# Patient Record
Sex: Female | Born: 1950
Health system: Southern US, Community
[De-identification: ages and names within clinical notes are randomized; demographics above are authoritative.]

## PROBLEM LIST (undated history)

## (undated) DIAGNOSIS — I10 Essential (primary) hypertension: Secondary | ICD-10-CM

## (undated) DIAGNOSIS — F419 Anxiety disorder, unspecified: Secondary | ICD-10-CM

## (undated) DIAGNOSIS — F410 Panic disorder [episodic paroxysmal anxiety] without agoraphobia: Secondary | ICD-10-CM

## (undated) DIAGNOSIS — K219 Gastro-esophageal reflux disease without esophagitis: Secondary | ICD-10-CM

## (undated) DIAGNOSIS — M1990 Unspecified osteoarthritis, unspecified site: Secondary | ICD-10-CM

## (undated) DIAGNOSIS — R42 Dizziness and giddiness: Secondary | ICD-10-CM

## (undated) DIAGNOSIS — N390 Urinary tract infection, site not specified: Secondary | ICD-10-CM

## (undated) HISTORY — PX: BREAST SURGERY: SHX581

## (undated) HISTORY — PX: ABDOMINAL HYSTERECTOMY: SHX81

## (undated) HISTORY — PX: BREAST BIOPSY: SHX20

---

## 1999-03-08 HISTORY — PX: BREAST EXCISIONAL BIOPSY: SUR124

## 2010-12-10 ENCOUNTER — Emergency Department (HOSPITAL_COMMUNITY)
Admission: EM | Admit: 2010-12-10 | Discharge: 2010-12-10 | Disposition: A | Discharge status: Home / Self Care | Payer: Self-pay | Attending: Emergency Medicine | Admitting: Emergency Medicine

## 2010-12-10 ENCOUNTER — Encounter: Payer: Self-pay | Admitting: *Deleted

## 2010-12-10 DIAGNOSIS — R070 Pain in throat: Secondary | ICD-10-CM | POA: Insufficient documentation

## 2010-12-10 DIAGNOSIS — H6690 Otitis media, unspecified, unspecified ear: Secondary | ICD-10-CM | POA: Insufficient documentation

## 2010-12-10 DIAGNOSIS — Z87891 Personal history of nicotine dependence: Secondary | ICD-10-CM | POA: Insufficient documentation

## 2010-12-10 DIAGNOSIS — Z79899 Other long term (current) drug therapy: Secondary | ICD-10-CM | POA: Insufficient documentation

## 2010-12-10 DIAGNOSIS — H9209 Otalgia, unspecified ear: Secondary | ICD-10-CM | POA: Insufficient documentation

## 2010-12-10 DIAGNOSIS — R599 Enlarged lymph nodes, unspecified: Secondary | ICD-10-CM | POA: Insufficient documentation

## 2010-12-10 HISTORY — DX: Unspecified osteoarthritis, unspecified site: M19.90

## 2010-12-10 HISTORY — DX: Essential (primary) hypertension: I10

## 2010-12-10 LAB — RAPID STREP SCREEN (MED CTR MEBANE ONLY): Streptococcus, Group A Screen (Direct): NEGATIVE

## 2010-12-10 MED ORDER — AZITHROMYCIN 250 MG PO TABS: ORAL_TABLET | ORAL | Status: DC

## 2010-12-10 MED ORDER — AZITHROMYCIN 250 MG PO TABS
500.0000 mg | ORAL_TABLET | Freq: Once | ORAL | Status: AC
  Administered 2010-12-10: 500 mg via ORAL
  Filled 2010-12-10: qty 2

## 2010-12-10 MED ORDER — HYDROCODONE-ACETAMINOPHEN 7.5-500 MG/15ML PO SOLN: 10.0000 mL | Freq: Once | ORAL | Status: DC

## 2010-12-10 NOTE — ED Provider Notes (Signed)
History     CSN: [id number] Arrival date & time: 12/10/2010  8:19 PM  Chief Complaint  Patient presents with  . Sore Throat  . Otalgia    (Consider location/radiation/quality/duration/timing/severity/associated sxs/prior treatment) Patient is a 60 y.o. female presenting with pharyngitis. The history is provided by the patient.  Sore Throat This is a new problem. The current episode started today. The problem occurs constantly. The problem has been unchanged. Associated symptoms include a sore throat and swollen glands. Pertinent negatives include no abdominal pain, arthralgias, chest pain, chills, congestion, coughing, fatigue, fever, headaches, myalgias, nausea, neck pain, numbness, rash, urinary symptoms, visual change, vomiting or weakness. Associated symptoms comments: Right ear pain. The symptoms are aggravated by swallowing. She has tried nothing for the symptoms. The treatment provided no relief.    Past Medical History  Diagnosis Date  . Hypertension   . Migraine   . Arthritis     Past Surgical History  Procedure Date  . Abdominal hysterectomy     Family History  Problem Relation Age of Onset  . Hypertension Mother   . Diabetes Father     History  Substance Use Topics  . Smoking status: Former Games developer  . Smokeless tobacco: Not on file  . Alcohol Use: No    OB History    Grav Para Term Preterm Abortions TAB SAB Ect Mult Living                  Review of Systems  Constitutional: Negative for fever, chills and fatigue.  HENT: Positive for ear pain and sore throat. Negative for congestion, facial swelling, mouth sores, trouble swallowing, neck pain, neck stiffness and dental problem.   Eyes: Negative for photophobia, redness and visual disturbance.  Respiratory: Negative for cough, shortness of breath and wheezing.   Cardiovascular: Negative for chest pain and palpitations.  Gastrointestinal: Negative for nausea, vomiting, abdominal pain and blood in stool.    Genitourinary: Negative for dysuria, hematuria and flank pain.  Musculoskeletal: Negative for myalgias, back pain and arthralgias.  Skin: Negative for rash.  Neurological: Negative for dizziness, facial asymmetry, weakness, numbness and headaches.  Hematological: Positive for adenopathy. Does not bruise/bleed easily.  All other systems reviewed and are negative.    Allergies  Amoxicillin; Bactrim; Ciprofloxacin; Imitrex; Tetracyclines & related; Zoloft; and Morphine and related  Home Medications   Current Outpatient Rx  Name Route Sig Dispense Refill  . LISINOPRIL 5 MG PO TABS Oral Take 2.5 mg by mouth every morning.      Marland Kitchen PROPRANOLOL HCL 10 MG PO TABS Oral Take 10 mg by mouth at bedtime.      Marland Kitchen RABEPRAZOLE SODIUM 20 MG PO TBEC Oral Take 20 mg by mouth daily.      . CYCLOBENZAPRINE HCL 10 MG PO TABS Oral Take 10 mg by mouth 3 (three) times daily as needed. For muscle spasms     . MECLIZINE HCL 25 MG PO TABS Oral Take 25 mg by mouth 3 (three) times daily as needed. For dizziness       BP 133/71  Pulse 84  Temp 97.5 F (36.4 C)  Resp 20  Ht 5\' 8"  (1.727 m)  Wt 230 lb (104.327 kg)  BMI 34.97 kg/m2  SpO2 97%  Physical Exam  Nursing note and vitals reviewed. Constitutional: She is oriented to person, place, and time. She appears well-developed and well-nourished.  Non-toxic appearance. She does not have a sickly appearance. She does not appear ill. No distress.  HENT:  Head: Normocephalic and atraumatic. No trismus in the jaw.  Right Ear: No swelling or tenderness. No mastoid tenderness. Tympanic membrane is erythematous and bulging. No hemotympanum. No decreased hearing is noted.  Left Ear: Tympanic membrane normal.  Mouth/Throat: Uvula is midline and mucous membranes are normal. No dental abscesses or uvula swelling. Posterior oropharyngeal edema and posterior oropharyngeal erythema present. No oropharyngeal exudate or tonsillar abscesses.  Eyes: EOM are normal. Pupils are  equal, round, and reactive to light.  Neck: Normal range of motion. Neck supple. No JVD present.  Cardiovascular: Normal rate, regular rhythm and normal heart sounds.   Pulmonary/Chest: Effort normal and breath sounds normal. No respiratory distress. She exhibits no tenderness.  Musculoskeletal: Normal range of motion. She exhibits no tenderness.  Lymphadenopathy:    She has cervical adenopathy.       Right cervical: Superficial cervical adenopathy present. No deep cervical and no posterior cervical adenopathy present.      Left cervical: No superficial cervical, no deep cervical and no posterior cervical adenopathy present.  Neurological: She is alert and oriented to person, place, and time. She displays normal reflexes. No cranial nerve deficit. She exhibits normal muscle tone. Coordination normal.  Skin: Skin is warm and dry.    ED Course  Procedures (including critical care time)  Results for orders placed during the hospital encounter of 12/10/10  RAPID STREP SCREEN      Component Value Range   Streptococcus, Group A Screen (Direct) NEGATIVE  NEGATIVE          MDM    Vitals stable.  NAD.  Non-toxic appearing.  Right otitis media        Parris Cudworth L. Nikolos Billig, [place] 12/11/10 1759

## 2010-12-10 NOTE — ED Notes (Signed)
Pt a/ox4. Resp even and unlabored. NAD at this time. D/C instructions and Rx reviewed with pt. Pt verbalized understanding. Pt ambulated to POV with steady gate.  

## 2010-12-10 NOTE — ED Notes (Signed)
Pt c/o right sided ear pain and sore throat since this morning.

## 2010-12-13 NOTE — ED Provider Notes (Signed)
Medical screening examination/treatment/procedure(s) were performed by non-physician practitioner and as supervising physician I was immediately available for consultation/collaboration.  Shelda Jakes, MD 12/13/10 [phone]

## 2011-04-15 ENCOUNTER — Emergency Department (HOSPITAL_COMMUNITY)
Admission: EM | Admit: 2011-04-15 | Discharge: 2011-04-15 | Disposition: A | Discharge status: Home / Self Care | Payer: Self-pay | Attending: Emergency Medicine | Admitting: Emergency Medicine

## 2011-04-15 ENCOUNTER — Encounter (HOSPITAL_COMMUNITY): Payer: Self-pay

## 2011-04-15 DIAGNOSIS — H6593 Unspecified nonsuppurative otitis media, bilateral: Secondary | ICD-10-CM

## 2011-04-15 DIAGNOSIS — R350 Frequency of micturition: Secondary | ICD-10-CM | POA: Insufficient documentation

## 2011-04-15 DIAGNOSIS — R110 Nausea: Secondary | ICD-10-CM | POA: Insufficient documentation

## 2011-04-15 DIAGNOSIS — H6590 Unspecified nonsuppurative otitis media, unspecified ear: Secondary | ICD-10-CM | POA: Insufficient documentation

## 2011-04-15 DIAGNOSIS — K219 Gastro-esophageal reflux disease without esophagitis: Secondary | ICD-10-CM | POA: Insufficient documentation

## 2011-04-15 DIAGNOSIS — M129 Arthropathy, unspecified: Secondary | ICD-10-CM | POA: Insufficient documentation

## 2011-04-15 DIAGNOSIS — I10 Essential (primary) hypertension: Secondary | ICD-10-CM | POA: Insufficient documentation

## 2011-04-15 DIAGNOSIS — Z79899 Other long term (current) drug therapy: Secondary | ICD-10-CM | POA: Insufficient documentation

## 2011-04-15 DIAGNOSIS — R6883 Chills (without fever): Secondary | ICD-10-CM | POA: Insufficient documentation

## 2011-04-15 DIAGNOSIS — R6889 Other general symptoms and signs: Secondary | ICD-10-CM | POA: Insufficient documentation

## 2011-04-15 DIAGNOSIS — R42 Dizziness and giddiness: Secondary | ICD-10-CM | POA: Insufficient documentation

## 2011-04-15 HISTORY — DX: Gastro-esophageal reflux disease without esophagitis: K21.9

## 2011-04-15 LAB — URINALYSIS, ROUTINE W REFLEX MICROSCOPIC
Bilirubin Urine: NEGATIVE
Nitrite: NEGATIVE
Protein, ur: NEGATIVE mg/dL
Specific Gravity, Urine: 1.025 (ref 1.005–1.030)
Urobilinogen, UA: 0.2 mg/dL (ref 0.0–1.0)

## 2011-04-15 MED ORDER — CETIRIZINE-PSEUDOEPHEDRINE ER 5-120 MG PO TB12: 1.0000 | ORAL_TABLET | Freq: Two times a day (BID) | ORAL | Status: DC | PRN

## 2011-04-15 NOTE — ED Notes (Signed)
Pt presents with nausea, chills, dizziness, and bilateral ear fullness x 2 days.

## 2011-04-15 NOTE — ED Provider Notes (Signed)
History     CSN: [id number]  Arrival date & time 04/15/11  1814   First MD Initiated Contact with Patient 04/15/11 1832      Chief Complaint  Patient presents with  . Nausea  . Chills  . Dizziness    (Consider location/radiation/quality/duration/timing/severity/associated sxs/prior treatment) The history is provided by the patient.  pt c/o bil ear fullness/fluid sensation for past few days. Constant. No severe ear pain, no hearing loss. Hx same. States has been sneezing. No purulent nasal discharge, or sinus pain. No sore throat. No cough. No headache. No fever or chills. Also states urine frequency in past few days. Hx uncomplicated uti. No abd pain. No back or flank pain. No dysuria. No polydipsia. No hx dm.   Past Medical History  Diagnosis Date  . Hypertension   . Migraine   . Arthritis   . GERD (gastroesophageal reflux disease)     Past Surgical History  Procedure Date  . Abdominal hysterectomy   . Breast surgery     Family History  Problem Relation Age of Onset  . Hypertension Mother   . Diabetes Father     History  Substance Use Topics  . Smoking status: Former Games developer  . Smokeless tobacco: Not on file  . Alcohol Use: No    OB History    Grav Para Term Preterm Abortions TAB SAB Ect Mult Living                  Review of Systems  Constitutional: Negative for fever and chills.  HENT: Negative for neck pain and neck stiffness.   Eyes: Negative for discharge and redness.  Respiratory: Negative for cough and shortness of breath.   Cardiovascular: Negative for chest pain.  Gastrointestinal: Negative for abdominal pain.  Genitourinary: Negative for flank pain.  Musculoskeletal: Negative for back pain.  Skin: Negative for rash.  Neurological: Negative for headaches.  Hematological: Does not bruise/bleed easily.  Psychiatric/Behavioral: Negative for confusion.    Allergies  Amoxicillin; Bactrim; Ciprofloxacin; Imitrex; Tetracyclines & related; Zoloft;  and Morphine and related  Home Medications   Current Outpatient Rx  Name Route Sig Dispense Refill  . AZITHROMYCIN 250 MG PO TABS  Take first 2 tablets together, then 1 every day until finished. 6 tablet 0  . CYCLOBENZAPRINE HCL 10 MG PO TABS Oral Take 10 mg by mouth 3 (three) times daily as needed. For muscle spasms     . LISINOPRIL 5 MG PO TABS Oral Take 2.5 mg by mouth every morning.      Marland Kitchen MECLIZINE HCL 25 MG PO TABS Oral Take 25 mg by mouth 3 (three) times daily as needed. For dizziness     . PROPRANOLOL HCL 10 MG PO TABS Oral Take 10 mg by mouth at bedtime.      Marland Kitchen RABEPRAZOLE SODIUM 20 MG PO TBEC Oral Take 20 mg by mouth daily.        BP 123/80  Pulse 91  Temp(Src) 98.6 F (37 C) (Oral)  Resp 18  Wt 222 lb 6 oz (100.869 kg)  SpO2 98%  Physical Exam  Nursing note and vitals reviewed. Constitutional: She is oriented to person, place, and time. She appears well-developed and well-nourished. No distress.  HENT:  Nose: Nose normal.  Mouth/Throat: Oropharynx is clear and moist.       Clear fluid behind bil tm. eacs clear.   Eyes: Conjunctivae are normal. No scleral icterus.  Neck: Normal range of motion. Neck supple. No tracheal  deviation present.       No stiffness or rigidity  Cardiovascular: Normal rate, normal heart sounds and intact distal pulses.   Pulmonary/Chest: Effort normal and breath sounds normal. No respiratory distress.  Abdominal: Soft. Normal appearance. There is no tenderness.  Genitourinary:       No cva tenderness  Musculoskeletal: She exhibits no edema.  Lymphadenopathy:    She has no cervical adenopathy.  Neurological: She is alert and oriented to person, place, and time.       Steady gait.   Skin: Skin is warm and dry. No rash noted.  Psychiatric: She has a normal mood and affect.    ED Course  Procedures (including critical care time)  Labs Reviewed - No data to display  Results for orders placed during the hospital encounter of 04/15/11    URINALYSIS, ROUTINE W REFLEX MICROSCOPIC      Component Value Range   Color, Urine YELLOW  YELLOW    APPearance CLEAR  CLEAR    Specific Gravity, Urine 1.025  1.005 - 1.030    pH 5.5  5.0 - 8.0    Glucose, UA NEGATIVE  NEGATIVE (mg/dL)   Hgb urine dipstick NEGATIVE  NEGATIVE    Bilirubin Urine NEGATIVE  NEGATIVE    Ketones, ur NEGATIVE  NEGATIVE (mg/dL)   Protein, ur NEGATIVE  NEGATIVE (mg/dL)   Urobilinogen, UA 0.2  0.0 - 1.0 (mg/dL)   Nitrite NEGATIVE  NEGATIVE    Leukocytes, UA NEGATIVE  NEGATIVE        MDM  Fluid behind bil tm, clear, no ertyema, no purulence.   No mastoid tenderness.   Ua.         Suzi Roots, MD 04/15/11 [phone]

## 2011-05-15 ENCOUNTER — Encounter (HOSPITAL_COMMUNITY): Payer: Self-pay | Admitting: *Deleted

## 2011-05-15 ENCOUNTER — Emergency Department (HOSPITAL_COMMUNITY)
Admission: EM | Admit: 2011-05-15 | Discharge: 2011-05-15 | Disposition: A | Discharge status: Home Health | Payer: Self-pay | Attending: Emergency Medicine | Admitting: Emergency Medicine

## 2011-05-15 DIAGNOSIS — M129 Arthropathy, unspecified: Secondary | ICD-10-CM | POA: Insufficient documentation

## 2011-05-15 DIAGNOSIS — Z832 Family history of diseases of the blood and blood-forming organs and certain disorders involving the immune mechanism: Secondary | ICD-10-CM | POA: Insufficient documentation

## 2011-05-15 DIAGNOSIS — J029 Acute pharyngitis, unspecified: Secondary | ICD-10-CM | POA: Insufficient documentation

## 2011-05-15 DIAGNOSIS — I10 Essential (primary) hypertension: Secondary | ICD-10-CM | POA: Insufficient documentation

## 2011-05-15 DIAGNOSIS — K219 Gastro-esophageal reflux disease without esophagitis: Secondary | ICD-10-CM | POA: Insufficient documentation

## 2011-05-15 MED ORDER — AZITHROMYCIN 250 MG PO TABS: ORAL_TABLET | ORAL | Status: DC

## 2011-05-15 NOTE — ED Notes (Signed)
Pt presents to ED today with URI symptoms x 1 week.

## 2011-05-15 NOTE — ED Notes (Signed)
Pt DC to home with steady gait 

## 2011-05-15 NOTE — Discharge Instructions (Signed)
Please use salt water gargles 3 times daily. Rest her voice as much as possible. Use 2 tablets of Zithromax on day one, then 1 daily with food until all taken. Please use Tylenol every 4 hours for fever or aching. Wash hands frequently.Pharyngitis, Viral and Bacterial Pharyngitis is soreness (inflammation) or infection of the pharynx. It is also called a sore throat. CAUSES  Most sore throats are caused by viruses and are part of a cold. However, some sore throats are caused by strep and other bacteria. Sore throats can also be caused by post nasal drip from draining sinuses, allergies and sometimes from sleeping with an open mouth. Infectious sore throats can be spread from person to person by coughing, sneezing and sharing cups or eating utensils. TREATMENT  Sore throats that are viral usually last 3-4 days. Viral illness will get better without medications (antibiotics). Strep throat and other bacterial infections will usually begin to get better about 24-48 hours after you begin to take antibiotics. HOME CARE INSTRUCTIONS   If the caregiver feels there is a bacterial infection or if there is a positive strep test, they will prescribe an antibiotic. The full course of antibiotics must be taken. If the full course of antibiotic is not taken, you or your child may become ill again. If you or your child has strep throat and do not finish all of the medication, serious heart or kidney diseases may develop.   Drink enough water and fluids to keep your urine clear or pale yellow.   Only take over-the-counter or prescription medicines for pain, discomfort or fever as directed by your caregiver.   Get lots of rest.   Gargle with salt water ( tsp. of salt in a glass of water) as often as every 1-2 hours as you need for comfort.   Hard candies may soothe the throat if individual is not at risk for choking. Throat sprays or lozenges may also be used.  SEEK MEDICAL CARE IF:   Large, tender lumps in the  neck develop.   A rash develops.   Green, yellow-brown or bloody sputum is coughed up.   Your baby is older than 3 months with a rectal temperature of 100.5 F (38.1 C) or higher for more than 1 day.  SEEK IMMEDIATE MEDICAL CARE IF:   A stiff neck develops.   You or your child are drooling or unable to swallow liquids.   You or your child are vomiting, unable to keep medications or liquids down.   You or your child has severe pain, unrelieved with recommended medications.   You or your child are having difficulty breathing (not due to stuffy nose).   You or your child are unable to fully open your mouth.   You or your child develop redness, swelling, or severe pain anywhere on the neck.   You have a fever.   Your baby is older than 3 months with a rectal temperature of 102 F (38.9 C) or higher.   Your baby is 35 months old or younger with a rectal temperature of 100.4 F (38 C) or higher.  MAKE SURE YOU:   Understand these instructions.   Will watch your condition.   Will get help right away if you are not doing well or get worse.  Document Released: 02/21/2005 Document Revised: 02/10/2011 Document Reviewed: 05/21/2007 [facility] Patient Information 2012 [place], [place].

## 2011-05-15 NOTE — ED Provider Notes (Signed)
History     CSN: [id number]  Arrival date & time 05/15/11  1120   First MD Initiated Contact with Patient 05/15/11 1254      Chief Complaint  Patient presents with  . URI  . Sore Throat    (Consider location/radiation/quality/duration/timing/severity/associated sxs/prior treatment) Patient is a 61 y.o. female presenting with URI and pharyngitis. The history is provided by the patient.  URI The primary symptoms include fatigue, headaches, ear pain and myalgias. Primary symptoms do not include cough, wheezing, abdominal pain or arthralgias. Primary symptoms comment: sorethroat The current episode started 6 to 7 days ago. This is a new problem.  The headache is not associated with photophobia.  Symptoms associated with the illness include chills, sinus pressure and congestion.  Sore Throat Associated symptoms include chills, congestion, fatigue, headaches and myalgias. Pertinent negatives include no abdominal pain, arthralgias, chest pain, coughing or neck pain.    Past Medical History  Diagnosis Date  . Hypertension   . Migraine   . Arthritis   . GERD (gastroesophageal reflux disease)     Past Surgical History  Procedure Date  . Abdominal hysterectomy   . Breast surgery     Family History  Problem Relation Age of Onset  . Hypertension Mother   . Diabetes Father     History  Substance Use Topics  . Smoking status: Former Games developer  . Smokeless tobacco: Not on file  . Alcohol Use: No    OB History    Grav Para Term Preterm Abortions TAB SAB Ect Mult Living                  Review of Systems  Constitutional: Positive for chills and fatigue. Negative for activity change.       All ROS Neg except as noted in HPI  HENT: Positive for ear pain, congestion and sinus pressure. Negative for nosebleeds and neck pain.   Eyes: Negative for photophobia and discharge.  Respiratory: Negative for cough, shortness of breath and wheezing.   Cardiovascular: Negative for chest  pain and palpitations.  Gastrointestinal: Negative for abdominal pain and blood in stool.  Genitourinary: Negative for dysuria, frequency and hematuria.  Musculoskeletal: Positive for myalgias. Negative for back pain and arthralgias.  Skin: Negative.   Neurological: Positive for headaches. Negative for dizziness, seizures and speech difficulty.  Psychiatric/Behavioral: Negative for hallucinations and confusion.    Allergies  Amoxicillin; Bactrim; Ciprofloxacin; Imitrex; Morphine and related; Tetracyclines & related; and Zoloft  Home Medications   Current Outpatient Rx  Name Route Sig Dispense Refill  . CETIRIZINE-PSEUDOEPHEDRINE ER 5-120 MG PO TB12 Oral Take 1 tablet by mouth 2 (two) times daily as needed for allergies. 20 tablet 0  . CYCLOBENZAPRINE HCL 10 MG PO TABS Oral Take 10 mg by mouth 3 (three) times daily as needed. For muscle spasms     . LISINOPRIL 5 MG PO TABS Oral Take 2.5 mg by mouth every morning.      Marland Kitchen MECLIZINE HCL 25 MG PO TABS Oral Take 25 mg by mouth 3 (three) times daily as needed. For dizziness     . PROPRANOLOL HCL 10 MG PO TABS Oral Take 10 mg by mouth at bedtime.      Marland Kitchen RABEPRAZOLE SODIUM 20 MG PO TBEC Oral Take 20 mg by mouth daily.        BP 121/75  Pulse 93  Temp(Src) 98.2 F (36.8 C) (Oral)  Resp 22  SpO2 98%  Physical Exam  Nursing note and  vitals reviewed. Constitutional: She is oriented to person, place, and time. She appears well-developed and well-nourished.  Non-toxic appearance.  HENT:  Head: Normocephalic.  Right Ear: Tympanic membrane and external ear normal.  Left Ear: Tympanic membrane and external ear normal.       Mild to moderate nasal congestion noted. The posterior pharynx shows increased redness and mild swelling. The uvula is swollen but remains in the midline. The airway is patent.  Eyes: EOM and lids are normal. Pupils are equal, round, and reactive to light.  Neck: Normal range of motion. Neck supple. Carotid bruit is not  present.  Cardiovascular: Normal rate, regular rhythm, normal heart sounds, intact distal pulses and normal pulses.   Pulmonary/Chest: Breath sounds normal. No respiratory distress.  Abdominal: Soft. Bowel sounds are normal. There is no tenderness. There is no guarding.  Musculoskeletal: Normal range of motion.  Lymphadenopathy:       Head (right side): No submandibular adenopathy present.       Head (left side): No submandibular adenopathy present.    She has no cervical adenopathy.  Neurological: She is alert and oriented to person, place, and time. She has normal strength. No cranial nerve deficit or sensory deficit.  Skin: Skin is warm and dry.  Psychiatric: She has a normal mood and affect. Her speech is normal.    ED Course  Procedures (including critical care time)  Labs Reviewed - No data to display No results found.   1. Pharyngitis       MDM  I have reviewed nursing notes, vital signs, and all appropriate lab and imaging results for this patient. Vital signs are found to be within normal limits. The patient appears to be in no acute distress at this time. Patient has pain with swallowing but can swallow liquids. Prescription for Zithromax [id number] given. Patient advised to see her primary physician for recheck next week.       Kathie Dike, [place] 05/15/11 1311

## 2011-05-17 NOTE — ED Provider Notes (Signed)
Medical screening examination/treatment/procedure(s) were performed by non-physician practitioner and as supervising physician I was immediately available for consultation/collaboration. Devoria Albe, MD, [facility]   Ward Givens, MD 05/17/11 [phone]

## 2011-05-31 ENCOUNTER — Emergency Department (HOSPITAL_COMMUNITY)
Admission: EM | Admit: 2011-05-31 | Discharge: 2011-05-31 | Disposition: A | Discharge status: Home / Self Care | Payer: Self-pay | Attending: Emergency Medicine | Admitting: Emergency Medicine

## 2011-05-31 ENCOUNTER — Encounter (HOSPITAL_COMMUNITY): Payer: Self-pay | Admitting: *Deleted

## 2011-05-31 DIAGNOSIS — R197 Diarrhea, unspecified: Secondary | ICD-10-CM | POA: Insufficient documentation

## 2011-05-31 DIAGNOSIS — K219 Gastro-esophageal reflux disease without esophagitis: Secondary | ICD-10-CM | POA: Insufficient documentation

## 2011-05-31 DIAGNOSIS — I10 Essential (primary) hypertension: Secondary | ICD-10-CM | POA: Insufficient documentation

## 2011-05-31 DIAGNOSIS — E860 Dehydration: Secondary | ICD-10-CM | POA: Insufficient documentation

## 2011-05-31 LAB — DIFFERENTIAL
Basophils Absolute: 0 10*3/uL (ref 0.0–0.1)
Basophils Relative: 0 % (ref 0–1)
Eosinophils Absolute: 0.2 10*3/uL (ref 0.0–0.7)
Lymphs Abs: 1.4 10*3/uL (ref 0.7–4.0)
Neutrophils Relative %: 72 % (ref 43–77)

## 2011-05-31 LAB — BASIC METABOLIC PANEL
Calcium: 9.9 mg/dL (ref 8.4–10.5)
GFR calc Af Amer: >90 mL/min (ref >90)
GFR calc non Af Amer: >90 mL/min (ref >90)
Glucose, Bld: 123 mg/dL — ABNORMAL HIGH (ref 70–99)
Potassium: 4.1 mEq/L (ref 3.5–5.1)
Sodium: 140 mEq/L (ref 135–145)

## 2011-05-31 LAB — CBC
MCH: 29.4 pg (ref 26.0–34.0)
Platelets: 198 10*3/uL (ref 150–400)
RBC: 4.66 MIL/uL (ref 3.87–5.11)
RDW: 12.6 % (ref 11.5–15.5)

## 2011-05-31 MED ORDER — SODIUM CHLORIDE 0.9 % IV BOLUS (SEPSIS)
1000.0000 mL | Freq: Once | INTRAVENOUS | Status: AC
  Administered 2011-05-31: 1000 mL via INTRAVENOUS

## 2011-05-31 MED ORDER — KETOROLAC TROMETHAMINE 30 MG/ML IJ SOLN: 30.0000 mg | Freq: Once | INTRAMUSCULAR | Status: DC

## 2011-05-31 MED ORDER — HYDROCORTISONE 1 % EX CREA: TOPICAL_CREAM | CUTANEOUS | Status: DC

## 2011-05-31 MED ORDER — ONDANSETRON HCL 4 MG/2ML IJ SOLN: 4.0000 mg | Freq: Once | INTRAMUSCULAR | Status: DC

## 2011-05-31 NOTE — ED Notes (Signed)
C/o diarrhea since yesterday morning (onset 1000); also c/o nausea and weakness.

## 2011-05-31 NOTE — Discharge Instructions (Signed)
Drink plenty of fluids.  And follow up with your md in 2 days

## 2011-06-02 NOTE — ED Provider Notes (Signed)
History     CSN: [id number]  Arrival date & time 05/31/11  4098   First MD Initiated Contact with Patient 05/31/11 [phone]      Chief Complaint  Patient presents with  . Diarrhea  . Nausea    (Consider location/radiation/quality/duration/timing/severity/associated sxs/prior treatment) Patient is a 61 y.o. female presenting with diarrhea. The history is provided by the patient (pt complains of diarrhea and soreness to her anus). No language interpreter was used.  Diarrhea The primary symptoms include diarrhea. Primary symptoms do not include fever, fatigue, abdominal pain or rash. The illness began yesterday. The onset was sudden. The problem has not changed since onset. The diarrhea began 2 days ago. The diarrhea is watery. The diarrhea occurs 5 to 10 times per day.  The illness does not include chills, constipation or back pain. Associated medical issues do not include inflammatory bowel disease.    Past Medical History  Diagnosis Date  . Hypertension   . Migraine   . Arthritis   . GERD (gastroesophageal reflux disease)     Past Surgical History  Procedure Date  . Abdominal hysterectomy   . Breast surgery     Family History  Problem Relation Age of Onset  . Hypertension Mother   . Diabetes Father     History  Substance Use Topics  . Smoking status: Former Games developer  . Smokeless tobacco: Not on file  . Alcohol Use: No    OB History    Grav Para Term Preterm Abortions TAB SAB Ect Mult Living                  Review of Systems  Constitutional: Negative for fever, chills and fatigue.  HENT: Negative for congestion, sinus pressure and ear discharge.   Eyes: Negative for discharge.  Respiratory: Negative for cough.   Cardiovascular: Negative for chest pain.  Gastrointestinal: Positive for diarrhea. Negative for abdominal pain and constipation.  Genitourinary: Negative for frequency and hematuria.  Musculoskeletal: Negative for back pain.  Skin: Negative for rash.   Neurological: Negative for seizures and headaches.  Hematological: Negative.   Psychiatric/Behavioral: Negative for hallucinations.    Allergies  Amoxicillin; Bactrim; Ciprofloxacin; Imitrex; Morphine and related; Tetracyclines & related; and Zoloft  Home Medications   Current Outpatient Rx  Name Route Sig Dispense Refill  . AZITHROMYCIN 250 MG PO TABS  2 tablets day 1, then 1 tablet daily with food until all taken. 6 tablet 0  . CETIRIZINE-PSEUDOEPHEDRINE ER 5-120 MG PO TB12 Oral Take 1 tablet by mouth 2 (two) times daily as needed for allergies. 20 tablet 0  . CYCLOBENZAPRINE HCL 10 MG PO TABS Oral Take 10 mg by mouth 3 (three) times daily as needed. For muscle spasms     . HYDROCORTISONE 1 % EX CREA  Apply to affected area 4 times daily 30 g 0  . LISINOPRIL 5 MG PO TABS Oral Take 2.5 mg by mouth every morning.      Marland Kitchen MECLIZINE HCL 25 MG PO TABS Oral Take 25 mg by mouth 3 (three) times daily as needed. For dizziness     . PROPRANOLOL HCL 10 MG PO TABS Oral Take 10 mg by mouth at bedtime.      Marland Kitchen RABEPRAZOLE SODIUM 20 MG PO TBEC Oral Take 20 mg by mouth daily.        BP 136/66  Pulse 80  Temp(Src) 98.4 F (36.9 C) (Oral)  Resp 16  Ht 5' 8.5" (1.74 m)  Wt 222 lb (  100.699 kg)  BMI 33.26 kg/m2  SpO2 98%  Physical Exam  Constitutional: She is oriented to person, place, and time. She appears well-developed.  HENT:  Head: Normocephalic and atraumatic.  Eyes: Conjunctivae and EOM are normal. No scleral icterus.  Neck: Neck supple. No thyromegaly present.  Cardiovascular: Normal rate and regular rhythm.  Exam reveals no gallop and no friction rub.   No murmur heard. Pulmonary/Chest: No stridor. She has no wheezes. She has no rales. She exhibits no tenderness.  Abdominal: She exhibits no distension. There is no tenderness. There is no rebound.  Genitourinary:       Mild inflamation around anus  Musculoskeletal: Normal range of motion. She exhibits no edema.  Lymphadenopathy:     She has no cervical adenopathy.  Neurological: She is oriented to person, place, and time. Coordination normal.  Skin: No rash noted. No erythema.  Psychiatric: She has a normal mood and affect. Her behavior is normal.    ED Course  Procedures (including critical care time)  Labs Reviewed  BASIC METABOLIC PANEL - Abnormal; Notable for the following:    Glucose, Bld 123 (*)    All other components within normal limits  CBC  DIFFERENTIAL  LAB REPORT - SCANNED   No results found.   1. Diarrhea    Results for orders placed during the hospital encounter of 05/31/11  CBC      Component Value Range   WBC 7.4  4.0 - 10.5 (K/uL)   RBC 4.66  3.87 - 5.11 (MIL/uL)   Hemoglobin 13.7  12.0 - 15.0 (g/dL)   HCT 16.1  09.6 - 04.5 (%)   MCV 90.3  78.0 - 100.0 (fL)   MCH 29.4  26.0 - 34.0 (pg)   MCHC 32.5  30.0 - 36.0 (g/dL)   RDW 40.9  81.1 - 91.4 (%)   Platelets 198  150 - 400 (K/uL)  DIFFERENTIAL      Component Value Range   Neutrophils Relative 72  43 - 77 (%)   Neutro Abs 5.4  1.7 - 7.7 (K/uL)   Lymphocytes Relative 19  12 - 46 (%)   Lymphs Abs 1.4  0.7 - 4.0 (K/uL)   Monocytes Relative 6  3 - 12 (%)   Monocytes Absolute 0.5  0.1 - 1.0 (K/uL)   Eosinophils Relative 2  0 - 5 (%)   Eosinophils Absolute 0.2  0.0 - 0.7 (K/uL)   Basophils Relative 0  0 - 1 (%)   Basophils Absolute 0.0  0.0 - 0.1 (K/uL)  BASIC METABOLIC PANEL      Component Value Range   Sodium 140  135 - 145 (mEq/L)   Potassium 4.1  3.5 - 5.1 (mEq/L)   Chloride 104  96 - 112 (mEq/L)   CO2 24  19 - 32 (mEq/L)   Glucose, Bld 123 (*) 70 - 99 (mg/dL)   BUN 9  6 - 23 (mg/dL)   Creatinine, Ser 7.82  0.50 - 1.10 (mg/dL)   Calcium 9.9  8.4 - 95.6 (mg/dL)   GFR calc non Af Amer >90  >90 (mL/min)   GFR calc Af Amer >90  >90 (mL/min)   No results found.     MDM  Diarrhea and dehydration.  Pt stable and will follow up as needed        [name], MD 06/02/11 [phone]

## 2011-06-21 ENCOUNTER — Other Ambulatory Visit (HOSPITAL_COMMUNITY): Payer: Self-pay | Admitting: Family Medicine

## 2011-06-21 DIAGNOSIS — Z139 Encounter for screening, unspecified: Secondary | ICD-10-CM

## 2011-07-05 ENCOUNTER — Ambulatory Visit (HOSPITAL_COMMUNITY)
Admission: RE | Admit: 2011-07-05 | Discharge: 2011-07-05 | Disposition: A | Discharge status: Home / Self Care | Payer: PRIVATE HEALTH INSURANCE | Source: Ambulatory Visit | Attending: Family Medicine | Admitting: Family Medicine

## 2011-07-05 DIAGNOSIS — Z139 Encounter for screening, unspecified: Secondary | ICD-10-CM

## 2011-07-05 DIAGNOSIS — Z1231 Encounter for screening mammogram for malignant neoplasm of breast: Secondary | ICD-10-CM | POA: Insufficient documentation

## 2011-08-27 ENCOUNTER — Encounter (HOSPITAL_COMMUNITY): Payer: Self-pay

## 2011-08-27 ENCOUNTER — Emergency Department (HOSPITAL_COMMUNITY)
Admission: EM | Admit: 2011-08-27 | Discharge: 2011-08-27 | Disposition: A | Discharge status: Home / Self Care | Payer: Self-pay | Attending: Emergency Medicine | Admitting: Emergency Medicine

## 2011-08-27 DIAGNOSIS — H9209 Otalgia, unspecified ear: Secondary | ICD-10-CM | POA: Insufficient documentation

## 2011-08-27 DIAGNOSIS — Z885 Allergy status to narcotic agent status: Secondary | ICD-10-CM | POA: Insufficient documentation

## 2011-08-27 DIAGNOSIS — K219 Gastro-esophageal reflux disease without esophagitis: Secondary | ICD-10-CM | POA: Insufficient documentation

## 2011-08-27 DIAGNOSIS — M129 Arthropathy, unspecified: Secondary | ICD-10-CM | POA: Insufficient documentation

## 2011-08-27 DIAGNOSIS — Z87891 Personal history of nicotine dependence: Secondary | ICD-10-CM | POA: Insufficient documentation

## 2011-08-27 DIAGNOSIS — H6093 Unspecified otitis externa, bilateral: Secondary | ICD-10-CM

## 2011-08-27 DIAGNOSIS — G43909 Migraine, unspecified, not intractable, without status migrainosus: Secondary | ICD-10-CM | POA: Insufficient documentation

## 2011-08-27 DIAGNOSIS — Z90710 Acquired absence of both cervix and uterus: Secondary | ICD-10-CM | POA: Insufficient documentation

## 2011-08-27 DIAGNOSIS — H60399 Other infective otitis externa, unspecified ear: Secondary | ICD-10-CM | POA: Insufficient documentation

## 2011-08-27 DIAGNOSIS — Z888 Allergy status to other drugs, medicaments and biological substances status: Secondary | ICD-10-CM | POA: Insufficient documentation

## 2011-08-27 DIAGNOSIS — Z833 Family history of diabetes mellitus: Secondary | ICD-10-CM | POA: Insufficient documentation

## 2011-08-27 DIAGNOSIS — I10 Essential (primary) hypertension: Secondary | ICD-10-CM | POA: Insufficient documentation

## 2011-08-27 DIAGNOSIS — Z8249 Family history of ischemic heart disease and other diseases of the circulatory system: Secondary | ICD-10-CM | POA: Insufficient documentation

## 2011-08-27 DIAGNOSIS — Z881 Allergy status to other antibiotic agents status: Secondary | ICD-10-CM | POA: Insufficient documentation

## 2011-08-27 MED ORDER — AZITHROMYCIN 250 MG PO TABS
500.0000 mg | ORAL_TABLET | Freq: Once | ORAL | Status: AC
  Administered 2011-08-27: 500 mg via ORAL
  Filled 2011-08-27: qty 2

## 2011-08-27 MED ORDER — NEOMYCIN-POLYMYXIN-HC 3.5-10000-1 OT SOLN
OTIC | Status: AC
  Filled 2011-08-27: qty 10

## 2011-08-27 MED ORDER — ANTIPYRINE-BENZOCAINE 5.4-1.4 % OT SOLN
3.0000 [drp] | OTIC | Status: DC | PRN
  Administered 2011-08-27: 3 [drp] via OTIC
  Filled 2011-08-27: qty 10

## 2011-08-27 MED ORDER — AZITHROMYCIN 250 MG PO TABS: ORAL_TABLET | ORAL | Status: DC

## 2011-08-27 MED ORDER — NEOMYCIN-POLYMYXIN-HC 3.5-10000-1 OT SOLN: 3.0000 [drp] | OTIC | Status: DC

## 2011-08-27 MED ORDER — IBUPROFEN 800 MG PO TABS: 800.0000 mg | ORAL_TABLET | Freq: Three times a day (TID) | ORAL | Status: AC

## 2011-08-27 NOTE — Discharge Instructions (Signed)
Please use overall been drops if needed for pain or discomfort. You may use these every 3 hours. Zithromax 1 daily for the next 4 days. Cortisporin drops to both ears every 4 hours for the next 5 days. Ibuprofen 3 times daily with food for pain and inflammation. Please see your primary physician if not improving.Otitis Externa Swimmer's ear (otitis externa) is an infection in the outer ear canal. It can be caused by a germ or a fungus. It may be caused by:  Swimming in dirty water.   Water that stays in the ear after swimming.  HOME CARE  Put drops in the ear canal as told by your doctor.   Only take medicine as told by your doctor.  GET HELP RIGHT AWAY IF:   You have a temperature by mouth above 102 F (38.9 C), not controlled by medicine.   There is ear pain after 3 days.  MAKE SURE YOU:   Understand these instructions.   Will watch this condition.   Will get help right away if you are not doing well or get worse.  Document Released: 08/10/2007 Document Revised: 02/10/2011 Document Reviewed: 08/10/2007 [facility] Patient Information 2012 [place], [place].

## 2011-08-27 NOTE — ED Notes (Signed)
Pt verbalized understanding of d/c instructions and without further questions 

## 2011-08-27 NOTE — ED Provider Notes (Signed)
History     CSN: [id number]  Arrival date & time 08/27/11  [place] Guardian   First MD Initiated Contact with Patient 08/27/11 2013      Chief Complaint  Patient presents with  . Otalgia    (Consider location/radiation/quality/duration/timing/severity/associated sxs/prior treatment) Patient is a 61 y.o. female presenting with ear pain. The history is provided by the patient.  Otalgia This is a new problem. The current episode started 2 days ago. There is pain in both ears. The problem occurs daily. The problem has not changed since onset.There has been no fever. The pain is moderate. Associated symptoms include headaches. Pertinent negatives include no ear discharge, no abdominal pain, no vomiting, no neck pain, no cough and no rash. Her past medical history does not include hearing loss.    Past Medical History  Diagnosis Date  . Hypertension   . Migraine   . Arthritis   . GERD (gastroesophageal reflux disease)     Past Surgical History  Procedure Date  . Abdominal hysterectomy   . Breast surgery     Family History  Problem Relation Age of Onset  . Hypertension Mother   . Diabetes Father     History  Substance Use Topics  . Smoking status: Former Games developer  . Smokeless tobacco: Not on file  . Alcohol Use: No    OB History    Grav Para Term Preterm Abortions TAB SAB Ect Mult Living                  Review of Systems  Constitutional: Negative for activity change.       All ROS Neg except as noted in HPI  HENT: Positive for ear pain. Negative for nosebleeds, neck pain and ear discharge.   Eyes: Negative for photophobia and discharge.  Respiratory: Negative for cough, shortness of breath and wheezing.   Cardiovascular: Negative for chest pain and palpitations.  Gastrointestinal: Negative for vomiting, abdominal pain and blood in stool.  Genitourinary: Negative for dysuria, frequency and hematuria.  Musculoskeletal: Negative for back pain and arthralgias.  Skin: Negative.   Negative for rash.  Neurological: Positive for headaches. Negative for dizziness, seizures and speech difficulty.  Psychiatric/Behavioral: Negative for hallucinations and confusion.    Allergies  Amoxicillin; Bactrim; Ciprofloxacin; Imitrex; Morphine and related; Tetracyclines & related; and Zoloft  Home Medications   Current Outpatient Rx  Name Route Sig Dispense Refill  . AZITHROMYCIN 250 MG PO TABS  2 tablets day 1, then 1 tablet daily with food until all taken. 6 tablet 0  . AZITHROMYCIN 250 MG PO TABS  1 po daily, take with food 4 tablet 0  . CETIRIZINE-PSEUDOEPHEDRINE ER 5-120 MG PO TB12 Oral Take 1 tablet by mouth 2 (two) times daily as needed for allergies. 20 tablet 0  . CYCLOBENZAPRINE HCL 10 MG PO TABS Oral Take 10 mg by mouth 3 (three) times daily as needed. For muscle spasms     . HYDROCORTISONE 1 % EX CREA  Apply to affected area 4 times daily 30 g 0  . IBUPROFEN 800 MG PO TABS Oral Take 1 tablet (800 mg total) by mouth 3 (three) times daily. 21 tablet 0  . LISINOPRIL 5 MG PO TABS Oral Take 2.5 mg by mouth every morning.      Marland Kitchen MECLIZINE HCL 25 MG PO TABS Oral Take 25 mg by mouth 3 (three) times daily as needed. For dizziness     . PROPRANOLOL HCL 10 MG PO TABS Oral Take 10  mg by mouth at bedtime.      Marland Kitchen RABEPRAZOLE SODIUM 20 MG PO TBEC Oral Take 20 mg by mouth daily.        BP 140/73  Pulse 77  Temp 98.1 F (36.7 C) (Oral)  Resp 16  Ht 5\' 8"  (1.727 m)  Wt 223 lb 3.2 oz (101.243 kg)  BMI 33.94 kg/m2  SpO2 100%  Physical Exam  Nursing note and vitals reviewed. Constitutional: She is oriented to person, place, and time. She appears well-developed and well-nourished.  Non-toxic appearance.  HENT:  Head: Normocephalic.  Right Ear: Tympanic membrane and external ear normal.  Left Ear: Tympanic membrane and external ear normal.       There is increased redness of the right and left external auditory canal. The tympanic membranes are well within normal limits. There is  mild soreness with tugging of the ears. There is no palpable nodes anterior to the ear and no swelling behind the ear.  Eyes: EOM and lids are normal. Pupils are equal, round, and reactive to light.  Neck: Normal range of motion. Neck supple. Carotid bruit is not present.  Cardiovascular: Normal rate, regular rhythm, normal heart sounds, intact distal pulses and normal pulses.   Pulmonary/Chest: Breath sounds normal. No respiratory distress.  Abdominal: Soft. Bowel sounds are normal. There is no tenderness. There is no guarding.  Musculoskeletal: Normal range of motion.  Lymphadenopathy:       Head (right side): No submandibular adenopathy present.       Head (left side): No submandibular adenopathy present.    She has no cervical adenopathy.  Neurological: She is alert and oriented to person, place, and time. She has normal strength. No cranial nerve deficit or sensory deficit.  Skin: Skin is warm and dry.  Psychiatric: She has a normal mood and affect. Her speech is normal.    ED Course  Procedures (including critical care time)  Labs Reviewed - No data to display No results found.   1. Otitis externa of both ears       MDM  I have reviewed nursing notes, vital signs, and all appropriate lab and imaging results for this patient. Patient has pain of both ears. Left more than right. Patient treated with Zithromax, ibuprofen, and overall been drops. Patient is to see her primary physician for followup and recheck if not improving.       Kathie Dike, [place] 08/27/11 2048

## 2011-08-27 NOTE — ED Notes (Signed)
Complain of pain in both ears

## 2011-08-28 NOTE — ED Provider Notes (Signed)
Medical screening examination/treatment/procedure(s) were performed by non-physician practitioner and as supervising physician I was immediately available for consultation/collaboration.   Joya Gaskins, MD 08/28/11 [phone]

## 2011-09-18 ENCOUNTER — Emergency Department (HOSPITAL_COMMUNITY)
Admission: EM | Admit: 2011-09-18 | Discharge: 2011-09-18 | Disposition: A | Discharge status: Home / Self Care | Payer: Self-pay | Attending: Emergency Medicine | Admitting: Emergency Medicine

## 2011-09-18 ENCOUNTER — Encounter (HOSPITAL_COMMUNITY): Payer: Self-pay

## 2011-09-18 DIAGNOSIS — I10 Essential (primary) hypertension: Secondary | ICD-10-CM | POA: Insufficient documentation

## 2011-09-18 DIAGNOSIS — R42 Dizziness and giddiness: Secondary | ICD-10-CM | POA: Insufficient documentation

## 2011-09-18 DIAGNOSIS — K219 Gastro-esophageal reflux disease without esophagitis: Secondary | ICD-10-CM | POA: Insufficient documentation

## 2011-09-18 DIAGNOSIS — Z880 Allergy status to penicillin: Secondary | ICD-10-CM | POA: Insufficient documentation

## 2011-09-18 DIAGNOSIS — Z87891 Personal history of nicotine dependence: Secondary | ICD-10-CM | POA: Insufficient documentation

## 2011-09-18 DIAGNOSIS — Z886 Allergy status to analgesic agent status: Secondary | ICD-10-CM | POA: Insufficient documentation

## 2011-09-18 DIAGNOSIS — M129 Arthropathy, unspecified: Secondary | ICD-10-CM | POA: Insufficient documentation

## 2011-09-18 LAB — POCT I-STAT, CHEM 8
Chloride: 104 mEq/L (ref 96–112)
HCT: 43 % (ref 36.0–46.0)
Hemoglobin: 14.6 g/dL (ref 12.0–15.0)
Potassium: 4.6 mEq/L (ref 3.5–5.1)
Sodium: 141 mEq/L (ref 135–145)

## 2011-09-18 MED ORDER — SODIUM CHLORIDE 0.9 % IV SOLN: 1000.0000 mL | INTRAVENOUS | Status: DC

## 2011-09-18 MED ORDER — METOCLOPRAMIDE HCL 5 MG/ML IJ SOLN
10.0000 mg | Freq: Once | INTRAMUSCULAR | Status: AC
  Administered 2011-09-18: 10 mg via INTRAVENOUS
  Filled 2011-09-18: qty 2

## 2011-09-18 MED ORDER — MECLIZINE HCL 25 MG PO TABS: 25.0000 mg | ORAL_TABLET | Freq: Three times a day (TID) | ORAL | Status: DC | PRN

## 2011-09-18 MED ORDER — SODIUM CHLORIDE 0.9 % IV SOLN
1000.0000 mL | Freq: Once | INTRAVENOUS | Status: AC
  Administered 2011-09-18: 1000 mL via INTRAVENOUS

## 2011-09-18 MED ORDER — MECLIZINE HCL 12.5 MG PO TABS
25.0000 mg | ORAL_TABLET | Freq: Three times a day (TID) | ORAL | Status: DC
  Administered 2011-09-18: 25 mg via ORAL
  Filled 2011-09-18: qty 2

## 2011-09-18 NOTE — ED Notes (Signed)
Pt reports laying down for nap and when she woke she was dizzy, unsteady gait and room spinning.

## 2011-09-18 NOTE — ED Notes (Signed)
Pt alert & oriented x4, stable gait. Patient given discharge instructions, paperwork & prescription(s). Patient  instructed to stop at the registration desk to finish any additional paperwork. Patient verbalized understanding. Pt left department w/ no further questions. 

## 2011-09-18 NOTE — ED Notes (Signed)
Pt reports feeling dizzy that started today. Has hx on inner ear problems. Not currently taking any medication for dizzyness. States her ears do fill full.

## 2011-09-18 NOTE — ED Provider Notes (Signed)
History     CSN: [id number]  Arrival date & time 09/18/11  7829   First MD Initiated Contact with Patient 09/18/11 1902      Chief Complaint  Patient presents with  . Dizziness    HPI Pt reports laying down for nap and when she woke she was dizzy, unsteady gait and room spinning.  She feels like her ears are full.  Movement increases the symptoms.  Lying still seems to help.  Patient has had vertigo before and this feels similar. She denies any trouble with her speech, balance or coordination. She denies any fevers chills or vomiting. She denies any chest pain or abdominal pain. She's not been as any rectal bleeding   Past Medical History  Diagnosis Date  . Hypertension   . Migraine   . Arthritis   . GERD (gastroesophageal reflux disease)     Past Surgical History  Procedure Date  . Abdominal hysterectomy   . Breast surgery     Family History  Problem Relation Age of Onset  . Hypertension Mother   . Diabetes Father     History  Substance Use Topics  . Smoking status: Former Games developer  . Smokeless tobacco: Not on file  . Alcohol Use: No    OB History    Grav Para Term Preterm Abortions TAB SAB Ect Mult Living                  Review of Systems  All other systems reviewed and are negative.    Allergies  Amoxicillin; Bactrim; Ciprofloxacin; Imitrex; Morphine and related; Tetracyclines & related; and Zoloft  Home Medications   Current Outpatient Rx  Name Route Sig Dispense Refill  . CYCLOBENZAPRINE HCL 10 MG PO TABS Oral Take 10 mg by mouth 3 (three) times daily as needed. For muscle spasms     . HYDROXYZINE PAMOATE 25 MG PO CAPS Oral Take 25 mg by mouth as needed.    Marland Kitchen LISINOPRIL 5 MG PO TABS Oral Take 2.5 mg by mouth every morning.      Marland Kitchen MECLIZINE HCL 25 MG PO TABS Oral Take 25 mg by mouth 3 (three) times daily as needed. For dizziness     . PROPRANOLOL HCL 10 MG PO TABS Oral Take 10 mg by mouth at bedtime.      Marland Kitchen RABEPRAZOLE SODIUM 20 MG PO TBEC Oral  Take 20 mg by mouth daily.        BP 154/82  Pulse 82  Temp 98.2 F (36.8 C) (Oral)  Resp 20  Ht 5' 8.5" (1.74 m)  Wt 225 lb (102.059 kg)  BMI 33.71 kg/m2  SpO2 100%  Physical Exam  Nursing note and vitals reviewed. Constitutional: She is oriented to person, place, and time. She appears well-developed and well-nourished. No distress.  HENT:  Head: Normocephalic and atraumatic.  Right Ear: External ear normal.  Left Ear: External ear normal.  Mouth/Throat: Oropharynx is clear and moist.  Eyes: Conjunctivae are normal. Right eye exhibits no discharge. Left eye exhibits no discharge. No scleral icterus.  Neck: Neck supple. No tracheal deviation present.  Cardiovascular: Normal rate, regular rhythm and intact distal pulses.   Pulmonary/Chest: Effort normal and breath sounds normal. No stridor. No respiratory distress. She has no wheezes. She has no rales.  Abdominal: Soft. Bowel sounds are normal. She exhibits no distension. There is no tenderness. There is no rebound and no guarding.  Musculoskeletal: She exhibits no edema and no tenderness.  Neurological: She  is alert and oriented to person, place, and time. She has normal strength. No cranial nerve deficit ( no gross defecits noted) or sensory deficit. She exhibits normal muscle tone. She displays no seizure activity. Coordination normal.       No pronator drift bilateral upper extrem, able to hold both legs off bed for 5 seconds, sensation intact in all extremities, no visual field cuts, no left or right sided neglect, normal finger to nose exam  Skin: Skin is warm and dry. No rash noted.  Psychiatric: She has a normal mood and affect.    ED Course  Procedures (including critical care time)   Labs Reviewed  POCT I-STAT, CHEM 8   No results found.   1. Vertigo       MDM  Patient was treated with meclizine in the ED. Symptoms have all resolved and she is feeling much better. Symptoms are not suggestive of a stroke.  Patient be discharged home but given precautions to return for worsening symptoms, trouble with her coordination, balance, or speech.        Celene Kras, MD 09/18/11 2108

## 2011-09-18 NOTE — ED Notes (Signed)
Pt denies any dizziness at present.

## 2012-04-01 ENCOUNTER — Emergency Department (HOSPITAL_COMMUNITY)
Admission: EM | Admit: 2012-04-01 | Discharge: 2012-04-01 | Disposition: A | Discharge status: Home / Self Care | Payer: Self-pay | Attending: Emergency Medicine | Admitting: Emergency Medicine

## 2012-04-01 ENCOUNTER — Encounter (HOSPITAL_COMMUNITY): Payer: Self-pay | Admitting: Emergency Medicine

## 2012-04-01 DIAGNOSIS — R6889 Other general symptoms and signs: Secondary | ICD-10-CM | POA: Insufficient documentation

## 2012-04-01 DIAGNOSIS — I10 Essential (primary) hypertension: Secondary | ICD-10-CM | POA: Insufficient documentation

## 2012-04-01 DIAGNOSIS — K122 Cellulitis and abscess of mouth: Secondary | ICD-10-CM

## 2012-04-01 DIAGNOSIS — K219 Gastro-esophageal reflux disease without esophagitis: Secondary | ICD-10-CM | POA: Insufficient documentation

## 2012-04-01 DIAGNOSIS — Z8739 Personal history of other diseases of the musculoskeletal system and connective tissue: Secondary | ICD-10-CM | POA: Insufficient documentation

## 2012-04-01 DIAGNOSIS — Z79899 Other long term (current) drug therapy: Secondary | ICD-10-CM | POA: Insufficient documentation

## 2012-04-01 DIAGNOSIS — R509 Fever, unspecified: Secondary | ICD-10-CM | POA: Insufficient documentation

## 2012-04-01 DIAGNOSIS — Z87891 Personal history of nicotine dependence: Secondary | ICD-10-CM | POA: Insufficient documentation

## 2012-04-01 DIAGNOSIS — R42 Dizziness and giddiness: Secondary | ICD-10-CM | POA: Insufficient documentation

## 2012-04-01 DIAGNOSIS — R52 Pain, unspecified: Secondary | ICD-10-CM | POA: Insufficient documentation

## 2012-04-01 DIAGNOSIS — Z8679 Personal history of other diseases of the circulatory system: Secondary | ICD-10-CM | POA: Insufficient documentation

## 2012-04-01 DIAGNOSIS — J3489 Other specified disorders of nose and nasal sinuses: Secondary | ICD-10-CM | POA: Insufficient documentation

## 2012-04-01 DIAGNOSIS — Q225 Ebstein's anomaly: Secondary | ICD-10-CM | POA: Insufficient documentation

## 2012-04-01 DIAGNOSIS — H9209 Otalgia, unspecified ear: Secondary | ICD-10-CM | POA: Insufficient documentation

## 2012-04-01 DIAGNOSIS — F410 Panic disorder [episodic paroxysmal anxiety] without agoraphobia: Secondary | ICD-10-CM | POA: Insufficient documentation

## 2012-04-01 HISTORY — DX: Panic disorder (episodic paroxysmal anxiety): F41.0

## 2012-04-01 LAB — RAPID STREP SCREEN (MED CTR MEBANE ONLY): Streptococcus, Group A Screen (Direct): NEGATIVE

## 2012-04-01 MED ORDER — PREDNISONE 10 MG PO TABS: 20.0000 mg | ORAL_TABLET | Freq: Every day | ORAL | Status: DC

## 2012-04-01 MED ORDER — AZITHROMYCIN 250 MG PO TABS: ORAL_TABLET | ORAL | Status: DC

## 2012-04-01 NOTE — ED Notes (Signed)
Woke up this mooring c/o hard time swolling took tylenol this am 0600 for fever.

## 2012-04-01 NOTE — ED Provider Notes (Signed)
History     CSN: [id number]  Arrival date & time 04/01/12  4098   First MD Initiated Contact with Patient 04/01/12 [phone]      Chief Complaint  Patient presents with  . Generalized Body Aches  . Sore Throat  . Fever    HPI Emily Cooke is a 62 y.o. female who presents to the ED with flu like symptoms. The symptoms started about a week ago. The onset was gradual. The symptoms have gotten worse. The symptoms include sore throat, fever, body aches and ear aches. 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 Patient denies cough, nausea, vomiting or diarrhea. The history was provided by the patient.  Past Medical History  Diagnosis Date  . Hypertension   . Migraine   . Arthritis   . GERD (gastroesophageal reflux disease)     Past Surgical History  Procedure Date  . Abdominal hysterectomy   . Breast surgery     Family History  Problem Relation Age of Onset  . Hypertension Mother   . Diabetes Father     History  Substance Use Topics  . Smoking status: Former Smoker -- 2.0 packs/day for 30 years    Types: Cigarettes    Quit date: 04/01/2002  . Smokeless tobacco: Not on file  . Alcohol Use: No    OB History    Grav Para Term Preterm Abortions TAB SAB Ect Mult Living   4 3 1 2 1  1   3       Review of Systems  Constitutional: Positive for fever, chills and fatigue. Negative for diaphoresis.  HENT: Positive for ear pain, congestion, sore throat and sneezing. Negative for facial swelling, trouble swallowing, neck pain, neck stiffness, dental problem and sinus pressure.   Eyes: Negative for photophobia, pain and discharge.  Respiratory: Negative for cough, chest tightness and wheezing.   Cardiovascular: Negative for chest pain and palpitations.  Gastrointestinal: Negative for nausea, vomiting, abdominal pain, diarrhea, constipation and abdominal distention.  Genitourinary: Negative for dysuria, frequency, flank pain, vaginal bleeding, vaginal discharge and difficulty  urinating.  Musculoskeletal: Negative for myalgias, back pain and gait problem.  Skin: Negative for color change and rash.  Neurological: Positive for dizziness. Negative for speech difficulty, weakness, light-headedness, numbness and headaches.  Psychiatric/Behavioral: Negative for confusion and agitation. The patient is not nervous/anxious.     Allergies  Amoxicillin; Bactrim; Ciprofloxacin; Imitrex; Morphine and related; Tetracyclines & related; and Zoloft  Home Medications   Current Outpatient Rx  Name  Route  Sig  Dispense  Refill  . LISINOPRIL 5 MG PO TABS   Oral   Take 2.5 mg by mouth every morning.           Marland Kitchen MECLIZINE HCL 25 MG PO TABS   Oral   Take 1 tablet (25 mg total) by mouth 3 (three) times daily as needed. For dizziness   30 tablet   1   . RABEPRAZOLE SODIUM 20 MG PO TBEC   Oral   Take 20 mg by mouth daily.           . CYCLOBENZAPRINE HCL 10 MG PO TABS   Oral   Take 10 mg by mouth 3 (three) times daily as needed. For muscle spasms          . HYDROXYZINE PAMOATE 25 MG PO CAPS   Oral   Take 25 mg by mouth as needed.         Marland Kitchen PROPRANOLOL HCL 10 MG PO TABS  Oral   Take 10 mg by mouth at bedtime.             BP 132/72  Pulse 100  Temp 98.1 F (36.7 C) (Oral)  Resp 20  Ht 5\' 8"  (1.727 m)  Wt 220 lb (99.791 kg)  BMI 33.45 kg/m2  SpO2 95%  Physical Exam  Nursing note and vitals reviewed. Constitutional: She is oriented to person, place, and time. She appears well-developed and well-nourished.  HENT:  Head: Normocephalic and atraumatic.  Right Ear: External ear normal. Tympanic membrane is not erythematous.  Left Ear: External ear normal. Tympanic membrane is not erythematous.  Mouth/Throat: Uvula is midline and mucous membranes are normal. Uvula swelling present. Posterior oropharyngeal erythema present.  Eyes: EOM are normal. Pupils are equal, round, and reactive to light.  Neck: Neck supple.  Cardiovascular: Normal rate, regular  rhythm and normal heart sounds.   Pulmonary/Chest: Effort normal. No respiratory distress. She has no wheezes.  Abdominal: Soft. There is no tenderness.  Musculoskeletal: Normal range of motion. She exhibits no edema.  Lymphadenopathy:    She has cervical adenopathy.  Neurological: She is alert and oriented to person, place, and time. No cranial nerve deficit.  Skin: Skin is warm and dry.  Psychiatric: She has a normal mood and affect. Her behavior is normal. Judgment and thought content normal.   Results for orders placed during the hospital encounter of 04/01/12 (from the past 24 hour(s))  RAPID STREP SCREEN     Status: Normal   Collection Time   04/01/12 10:05 AM      Component Value Range   Streptococcus, Group A Screen (Direct) NEGATIVE  NEGATIVE    Procedures   Assessment: Uvulitis    Plan:  Z-Pak   Prednisone     Follow up with PCP  Discussed with the patient and all questioned fully answered. She will returnif any problems arise such as difficulty swallowing, shortness of breath, fever or other problems.   Medication List     As of 04/01/2012  4:49 PM    START taking these medications         azithromycin 250 MG tablet   Commonly known as: ZITHROMAX   Take 2 tablets now and then one tablet daily      predniSONE 10 MG tablet   Commonly known as: DELTASONE   Take 2 tablets (20 mg total) by mouth daily.      ASK your doctor about these medications         cyclobenzaprine 10 MG tablet   Commonly known as: FLEXERIL      hydrOXYzine 25 MG capsule   Commonly known as: VISTARIL      lisinopril 5 MG tablet   Commonly known as: PRINIVIL,ZESTRIL      meclizine 25 MG tablet   Commonly known as: ANTIVERT   Take 1 tablet (25 mg total) by mouth 3 (three) times daily as needed. For dizziness      propranolol 10 MG tablet   Commonly known as: INDERAL      RABEprazole 20 MG tablet   Commonly known as: ACIPHEX          Where to get your medications    These are  the prescriptions that you need to pick up.   You may get these medications from any pharmacy.         azithromycin 250 MG tablet   predniSONE 10 MG tablet  Janne Napoleon, [place] 04/01/12 1650

## 2012-04-01 NOTE — ED Notes (Signed)
Patient with no complaints at this time. Respirations even and unlabored. Skin warm/dry. Discharge instructions reviewed with patient at this time. Patient given opportunity to voice concerns/ask questions. Patient discharged at this time and left Emergency Department with steady gait.   

## 2012-04-03 NOTE — ED Provider Notes (Signed)
Medical screening examination/treatment/procedure(s) were performed by non-physician practitioner and as supervising physician I was immediately available for consultation/collaboration.    Jasilyn Holderman R Jshawn Hurta, MD 04/03/12 0815 

## 2012-07-08 ENCOUNTER — Emergency Department (HOSPITAL_COMMUNITY)
Admission: EM | Admit: 2012-07-08 | Discharge: 2012-07-08 | Disposition: A | Discharge status: Home / Self Care | Payer: Medicaid Other | Attending: Emergency Medicine | Admitting: Emergency Medicine

## 2012-07-08 ENCOUNTER — Encounter (HOSPITAL_COMMUNITY): Payer: Self-pay | Admitting: Emergency Medicine

## 2012-07-08 DIAGNOSIS — F410 Panic disorder [episodic paroxysmal anxiety] without agoraphobia: Secondary | ICD-10-CM | POA: Insufficient documentation

## 2012-07-08 DIAGNOSIS — M79602 Pain in left arm: Secondary | ICD-10-CM

## 2012-07-08 DIAGNOSIS — K219 Gastro-esophageal reflux disease without esophagitis: Secondary | ICD-10-CM | POA: Insufficient documentation

## 2012-07-08 DIAGNOSIS — R209 Unspecified disturbances of skin sensation: Secondary | ICD-10-CM | POA: Insufficient documentation

## 2012-07-08 DIAGNOSIS — I10 Essential (primary) hypertension: Secondary | ICD-10-CM | POA: Insufficient documentation

## 2012-07-08 DIAGNOSIS — Z79899 Other long term (current) drug therapy: Secondary | ICD-10-CM | POA: Insufficient documentation

## 2012-07-08 DIAGNOSIS — Z8739 Personal history of other diseases of the musculoskeletal system and connective tissue: Secondary | ICD-10-CM | POA: Insufficient documentation

## 2012-07-08 DIAGNOSIS — M79609 Pain in unspecified limb: Secondary | ICD-10-CM | POA: Insufficient documentation

## 2012-07-08 DIAGNOSIS — Z87891 Personal history of nicotine dependence: Secondary | ICD-10-CM | POA: Insufficient documentation

## 2012-07-08 DIAGNOSIS — G43909 Migraine, unspecified, not intractable, without status migrainosus: Secondary | ICD-10-CM | POA: Insufficient documentation

## 2012-07-08 HISTORY — DX: Anxiety disorder, unspecified: F41.9

## 2012-07-08 HISTORY — DX: Panic disorder (episodic paroxysmal anxiety): F41.0

## 2012-07-08 NOTE — ED Provider Notes (Signed)
History  This chart was scribed for Donnetta Hutching, MD by Bennett Scrape, ED Scribe. This patient was seen in room APA06/APA06 and the patient's care was started at 7:47 PM.  CSN: [id number]  Arrival date & time 07/08/12  1191   First MD Initiated Contact with Patient 07/08/12 1936      Chief Complaint  Patient presents with  . Arm Pain     The history is provided by the patient. No language interpreter was used.    HPI Comments: Emily Cooke is a 62 y.o. female with a h/o anxiety who presents to the Emergency Department complaining of gradual onset, gradually improving, constant left upper arm pain described as pressure with associated left hand numbness that started after waking this morning. She reports that the pressure is mild currently and the numbness has since resolved. She denies any known injuries. She reports that she has been under "a lot of stress" due to not having a job. Mother had 3 MIs but pt denies having a h/o heart or lung problems, HLD and DM. She denies having any prior stress test or cardiac catheterizations. She denies left arm weakness, CP, nausea, diaphoresis and SOB as associated symptoms. She also has a h/o HTN and GERD. Pt is a former smoker but denies alcohol use.  PCP is [facility]  Past Medical History  Diagnosis Date  . Hypertension   . Migraine   . Arthritis   . GERD (gastroesophageal reflux disease)   . Panic attack   . Anxiety   . Panic disorder     Past Surgical History  Procedure Laterality Date  . Breast surgery    . Abdominal hysterectomy      partial    Family History  Problem Relation Age of Onset  . Hypertension Mother   . Diabetes Father     History  Substance Use Topics  . Smoking status: Former Smoker -- 2.00 packs/day for 30 years    Types: Cigarettes    Quit date: 04/01/2002  . Smokeless tobacco: Not on file  . Alcohol Use: No    OB History   Grav Para Term Preterm Abortions TAB SAB Ect Mult  Living   4 3 1 2 1  1   3       Review of Systems  A complete 10 system review of systems was obtained and all systems are negative except as noted in the HPI and PMH.   Allergies  Amoxicillin; Bactrim; Ciprofloxacin; Imitrex; Morphine and related; Tetracyclines & related; and Zoloft  Home Medications   Current Outpatient Rx  Name  Route  Sig  Dispense  Refill  . lisinopril (PRINIVIL,ZESTRIL) 5 MG tablet   Oral   Take 2.5 mg by mouth every morning.          . propranolol (INDERAL) 10 MG tablet   Oral   Take 10 mg by mouth at bedtime.           . RABEprazole (ACIPHEX) 20 MG tablet   Oral   Take 20 mg by mouth every morning.            Triage Vitals: BP 149/76  Pulse 74  Temp(Src) 97.9 F (36.6 C) (Oral)  Resp 20  Ht 5' 8.5" (1.74 m)  Wt 220 lb (99.791 kg)  BMI 32.96 kg/m2  SpO2 100%  Physical Exam  Nursing note and vitals reviewed. Constitutional: She is oriented to person, place, and time. She appears well-developed and well-nourished.  HENT:  Head: Normocephalic and atraumatic.  Eyes: Conjunctivae and EOM are normal. Pupils are equal, round, and reactive to light.  Neck: Normal range of motion. Neck supple.  Cardiovascular: Normal rate, regular rhythm and normal heart sounds.   Pulmonary/Chest: Effort normal and breath sounds normal.  Abdominal: Soft. Bowel sounds are normal.  Musculoskeletal: Normal range of motion.  Neurological: She is alert and oriented to person, place, and time.  Skin: Skin is warm and dry.  Psychiatric: She has a normal mood and affect.    ED Course  Procedures (including critical care time)  DIAGNOSTIC STUDIES: Oxygen Saturation is 100% on room air, normal by my interpretation.    COORDINATION OF CARE: 7:54 PM-Advised pt that I believe that her symptoms are due to anxiety and not cardiac related. Discussed discharge plan which includes following up with PCP with pt at bedside and pt agreed to plan.   Labs Reviewed - No  data to display No results found.   No diagnosis found.   Date: 07/08/2012  Rate: 65  Rhythm: normal sinus rhythm  QRS Axis: normal  Intervals: normal  ST/T Wave abnormalities: normal  Conduction Disutrbances: none  Narrative Interpretation: unremarkable     MDM  No chest pain, dyspnea, diaphoresis, nausea.    Symptoms have completely abated. EKG normal. Patient has primary care follow up      I personally performed the services described in this documentation, which was scribed in my presence. The recorded information has been reviewed and is accurate.    Donnetta Hutching, MD 07/08/12 2006

## 2012-07-08 NOTE — ED Notes (Signed)
Pt complains of upper left arm pain and pressure, states that she woke up with the pain, also notes numbness going down her arm to her hand, denies chest pain but states she has been "belching" a lot lately. Denies known injury to the arm.No other complaints.

## 2012-07-08 NOTE — ED Notes (Signed)
Patient reports left arm pain/pressure that started this morning.

## 2012-07-08 NOTE — ED Notes (Signed)
Pt left prior to receiving discharge papers or instructions  

## 2012-08-14 ENCOUNTER — Other Ambulatory Visit (HOSPITAL_COMMUNITY): Payer: Self-pay | Admitting: Nurse Practitioner

## 2012-08-21 ENCOUNTER — Other Ambulatory Visit (HOSPITAL_COMMUNITY): Payer: Self-pay | Admitting: Nurse Practitioner

## 2012-08-21 DIAGNOSIS — Z139 Encounter for screening, unspecified: Secondary | ICD-10-CM

## 2012-08-28 ENCOUNTER — Ambulatory Visit (HOSPITAL_COMMUNITY)
Admission: RE | Admit: 2012-08-28 | Discharge: 2012-08-28 | Disposition: A | Discharge status: Home / Self Care | Payer: Self-pay | Source: Ambulatory Visit | Attending: Family Medicine | Admitting: Family Medicine

## 2012-08-28 DIAGNOSIS — Z139 Encounter for screening, unspecified: Secondary | ICD-10-CM

## 2012-09-02 ENCOUNTER — Encounter (HOSPITAL_COMMUNITY): Payer: Self-pay

## 2012-09-02 ENCOUNTER — Emergency Department (HOSPITAL_COMMUNITY)
Admission: EM | Admit: 2012-09-02 | Discharge: 2012-09-02 | Disposition: A | Discharge status: Home / Self Care | Payer: Medicaid Other | Attending: Emergency Medicine | Admitting: Emergency Medicine

## 2012-09-02 ENCOUNTER — Emergency Department (HOSPITAL_COMMUNITY): Payer: Medicaid Other

## 2012-09-02 DIAGNOSIS — Z79899 Other long term (current) drug therapy: Secondary | ICD-10-CM | POA: Insufficient documentation

## 2012-09-02 DIAGNOSIS — I10 Essential (primary) hypertension: Secondary | ICD-10-CM | POA: Insufficient documentation

## 2012-09-02 DIAGNOSIS — Z87891 Personal history of nicotine dependence: Secondary | ICD-10-CM | POA: Insufficient documentation

## 2012-09-02 DIAGNOSIS — R51 Headache: Secondary | ICD-10-CM | POA: Insufficient documentation

## 2012-09-02 DIAGNOSIS — R5381 Other malaise: Secondary | ICD-10-CM | POA: Insufficient documentation

## 2012-09-02 DIAGNOSIS — K219 Gastro-esophageal reflux disease without esophagitis: Secondary | ICD-10-CM | POA: Insufficient documentation

## 2012-09-02 DIAGNOSIS — R002 Palpitations: Secondary | ICD-10-CM

## 2012-09-02 DIAGNOSIS — Z8739 Personal history of other diseases of the musculoskeletal system and connective tissue: Secondary | ICD-10-CM | POA: Insufficient documentation

## 2012-09-02 DIAGNOSIS — F410 Panic disorder [episodic paroxysmal anxiety] without agoraphobia: Secondary | ICD-10-CM | POA: Insufficient documentation

## 2012-09-02 DIAGNOSIS — G43909 Migraine, unspecified, not intractable, without status migrainosus: Secondary | ICD-10-CM | POA: Insufficient documentation

## 2012-09-02 LAB — CBC WITH DIFFERENTIAL/PLATELET
Eosinophils Relative: 2 % (ref 0–5)
HCT: 39.6 % (ref 36.0–46.0)
Hemoglobin: 13.4 g/dL (ref 12.0–15.0)
Lymphocytes Relative: 39 % (ref 12–46)
Lymphs Abs: 2 10*3/uL (ref 0.7–4.0)
MCH: 30.2 pg (ref 26.0–34.0)
MCV: 89.2 fL (ref 78.0–100.0)
Monocytes Relative: 7 % (ref 3–12)
Platelets: 219 10*3/uL (ref 150–400)
RBC: 4.44 MIL/uL (ref 3.87–5.11)
WBC: 5.2 10*3/uL (ref 4.0–10.5)

## 2012-09-02 LAB — COMPREHENSIVE METABOLIC PANEL
ALT: 18 U/L (ref 0–35)
Alkaline Phosphatase: 85 U/L (ref 39–117)
BUN: 11 mg/dL (ref 6–23)
CO2: 28 mEq/L (ref 19–32)
Calcium: 9.5 mg/dL (ref 8.4–10.5)
GFR calc Af Amer: >90 mL/min (ref >90)
GFR calc non Af Amer: >90 mL/min (ref >90)
Glucose, Bld: 98 mg/dL (ref 70–99)
Sodium: 141 mEq/L (ref 135–145)

## 2012-09-02 MED ORDER — GI COCKTAIL ~~LOC~~
30.0000 mL | Freq: Once | ORAL | Status: AC
  Administered 2012-09-02: 30 mL via ORAL
  Filled 2012-09-02: qty 30

## 2012-09-02 NOTE — ED Notes (Signed)
Pt reports having "palpitations" that started apporx 1 hour pta.  Denies any sob, denies any n/v.

## 2012-09-02 NOTE — ED Notes (Signed)
C/o epigastric burning. Dr. Estell Harpin aware.

## 2012-09-02 NOTE — ED Provider Notes (Signed)
History    This chart was scribed for Emily Lennert, MD by Emily Cooke, ED Scribe. This patient was seen in room APA14/APA14 and the patient's care was started 10:53 AM.  CSN: [id number] Arrival date & time 09/02/12  1041  First MD Initiated Contact with Patient 09/02/12 1048     Chief Complaint  Patient presents with  . Palpitations    Patient is a 62 y.o. female presenting with weakness. The history is provided by the patient (pt complains of palpitaions). No language interpreter was used.  Weakness This is a new problem. The current episode started 6 to 12 hours ago. The problem occurs rarely. The problem has not changed since onset.Associated symptoms include headaches. Nothing aggravates the symptoms. Nothing relieves the symptoms. She has tried nothing for the symptoms.    HPI Comments: Emily Cooke is a 62 y.o. female who presents to the Emergency Department complaining of constant, gradually improving heart palpitations that started about 1 hour PTA. States she had a similar episode that occurred last week. She describes the palpitations as though her heart is skipping beats. She denies having chest pain or pressure at this time. Pt takes lisinopril and propranolol regularly but she has not been taking the propanolol recently because she has ran out. Pt denies having any modifying factors. She denies diaphoresis, nausea, SOB. Pt has h/o HTN, heart palpitations, panic attack, panic disorder, anxiety. Pt is a former smoker but denies alcohol use.   Past Medical History  Diagnosis Date  . Hypertension   . Migraine   . Arthritis   . GERD (gastroesophageal reflux disease)   . Panic attack   . Anxiety   . Panic disorder    Past Surgical History  Procedure Laterality Date  . Breast surgery    . Abdominal hysterectomy      partial   Family History  Problem Relation Age of Onset  . Hypertension Mother   . Diabetes Father    History  Substance Use Topics  . Smoking  status: Former Smoker -- 2.00 packs/day for 30 years    Types: Cigarettes    Quit date: 04/01/2002  . Smokeless tobacco: Not on file  . Alcohol Use: No   OB History   Grav Para Term Preterm Abortions TAB SAB Ect Mult Living   4 3 1 2 1  1   3      Review of Systems  Constitutional: Negative for appetite change.  HENT: Negative for congestion, sinus pressure and ear discharge.   Eyes: Negative for discharge.  Gastrointestinal: Negative for diarrhea.  Genitourinary: Negative for frequency and hematuria.  Skin: Negative for rash.  Neurological: Positive for weakness and headaches. Negative for seizures.  Psychiatric/Behavioral: Negative for hallucinations.    Allergies  Amoxicillin; Bactrim; Ciprofloxacin; Imitrex; Morphine and related; Tetracyclines & related; and Zoloft  Home Medications   Current Outpatient Rx  Name  Route  Sig  Dispense  Refill  . lisinopril (PRINIVIL,ZESTRIL) 5 MG tablet   Oral   Take 2.5 mg by mouth every morning.          . propranolol (INDERAL) 10 MG tablet   Oral   Take 10 mg by mouth at bedtime.           . RABEprazole (ACIPHEX) 20 MG tablet   Oral   Take 20 mg by mouth every morning.           BP 155/76  Pulse 96  Temp(Src) 98.2 F (36.8 C) (  Oral)  Resp 20  Ht 5\' 8"  (1.727 m)  Wt 225 lb (102.059 kg)  BMI 34.22 kg/m2  SpO2 96% Physical Exam  Nursing note and vitals reviewed. Constitutional: She is oriented to person, place, and time. She appears well-developed.  HENT:  Head: Normocephalic.  Eyes: Conjunctivae and EOM are normal. No scleral icterus.  Neck: Neck supple. No thyromegaly present.  Cardiovascular: Normal rate, regular rhythm and normal heart sounds.  Exam reveals no gallop and no friction rub.   No murmur heard. Pulmonary/Chest: Effort normal and breath sounds normal. No stridor. No respiratory distress. She has no wheezes. She has no rales. She exhibits no tenderness.  Abdominal: Soft. Bowel sounds are normal. She  exhibits no distension. There is no tenderness. There is no rebound.  Musculoskeletal: Normal range of motion. She exhibits no edema.  Lymphadenopathy:    She has no cervical adenopathy.  Neurological: She is oriented to person, place, and time. Coordination normal.  Skin: No rash noted. No erythema.  Psychiatric: She has a normal mood and affect. Her behavior is normal.    ED Course  Procedures (including critical care time) DIAGNOSTIC STUDIES: Oxygen Saturation is 96% on RA, adequate by my interpretation.    COORDINATION OF CARE: 10:59 AM Discussed treatment plan with pt at bedside and pt agreed to plan.   Labs Reviewed  COMPREHENSIVE METABOLIC PANEL - Abnormal; Notable for the following:    Potassium 3.4 (*)    All other components within normal limits  CBC WITH DIFFERENTIAL   Dg Chest 2 View  09/02/2012   *RADIOLOGY REPORT*  Clinical Data: Cardiac palpitations.  CHEST - 2 VIEW  Comparison: None.  Findings: Linear subsegmental atelectasis or scarring noted peripherally at both lung bases.  Lungs appear otherwise clear. Mild thoracic spondylosis. Cardiac and mediastinal contours appear unremarkable.  IMPRESSION:  1.  Minimal bibasilar subsegmental atelectasis or scarring. Otherwise negative.   Original Report Authenticated By: Gaylyn Rong, M.D.   No diagnosis found.  Date: 09/02/2012  Rate:87  Rhythm: normal sinus rhythm  QRS Axis: normal  Intervals: normal  ST/T Wave abnormalities: normal  Conduction Disutrbances:none  Narrative Interpretation:   Old EKG Reviewed: none available   MDM  The chart was scribed for me under my direct supervision.  I personally performed the history, physical, and medical decision making and all procedures in the evaluation of this patient.Emily Lennert, MD 09/02/12 1247

## 2012-10-18 ENCOUNTER — Emergency Department (HOSPITAL_COMMUNITY)
Admission: EM | Admit: 2012-10-18 | Discharge: 2012-10-18 | Disposition: A | Discharge status: Home / Self Care | Payer: Medicaid Other | Attending: Emergency Medicine | Admitting: Emergency Medicine

## 2012-10-18 ENCOUNTER — Encounter (HOSPITAL_COMMUNITY): Payer: Self-pay | Admitting: Emergency Medicine

## 2012-10-18 DIAGNOSIS — Z8659 Personal history of other mental and behavioral disorders: Secondary | ICD-10-CM | POA: Insufficient documentation

## 2012-10-18 DIAGNOSIS — I10 Essential (primary) hypertension: Secondary | ICD-10-CM | POA: Insufficient documentation

## 2012-10-18 DIAGNOSIS — K219 Gastro-esophageal reflux disease without esophagitis: Secondary | ICD-10-CM | POA: Insufficient documentation

## 2012-10-18 DIAGNOSIS — Z87891 Personal history of nicotine dependence: Secondary | ICD-10-CM | POA: Insufficient documentation

## 2012-10-18 DIAGNOSIS — G43909 Migraine, unspecified, not intractable, without status migrainosus: Secondary | ICD-10-CM | POA: Insufficient documentation

## 2012-10-18 DIAGNOSIS — R110 Nausea: Secondary | ICD-10-CM | POA: Insufficient documentation

## 2012-10-18 DIAGNOSIS — Z8739 Personal history of other diseases of the musculoskeletal system and connective tissue: Secondary | ICD-10-CM | POA: Insufficient documentation

## 2012-10-18 DIAGNOSIS — F411 Generalized anxiety disorder: Secondary | ICD-10-CM | POA: Insufficient documentation

## 2012-10-18 DIAGNOSIS — Z880 Allergy status to penicillin: Secondary | ICD-10-CM | POA: Insufficient documentation

## 2012-10-18 DIAGNOSIS — Z79899 Other long term (current) drug therapy: Secondary | ICD-10-CM | POA: Insufficient documentation

## 2012-10-18 LAB — CBC WITH DIFFERENTIAL/PLATELET
Basophils Absolute: 0 10*3/uL (ref 0.0–0.1)
Basophils Relative: 0 % (ref 0–1)
MCHC: 34.3 g/dL (ref 30.0–36.0)
Neutro Abs: 4.1 10*3/uL (ref 1.7–7.7)
Neutrophils Relative %: 60 % (ref 43–77)
RDW: 12.4 % (ref 11.5–15.5)
WBC: 7 10*3/uL (ref 4.0–10.5)

## 2012-10-18 LAB — COMPREHENSIVE METABOLIC PANEL
BUN: 9 mg/dL (ref 6–23)
CO2: 27 mEq/L (ref 19–32)
Calcium: 9.2 mg/dL (ref 8.4–10.5)
Creatinine, Ser: 0.65 mg/dL (ref 0.50–1.10)
GFR calc Af Amer: >90 mL/min (ref >90)
GFR calc non Af Amer: >90 mL/min (ref >90)
Glucose, Bld: 103 mg/dL — ABNORMAL HIGH (ref 70–99)
Sodium: 137 mEq/L (ref 135–145)
Total Protein: 7.2 g/dL (ref 6.0–8.3)

## 2012-10-18 MED ORDER — METOCLOPRAMIDE HCL 5 MG/ML IJ SOLN
10.0000 mg | Freq: Once | INTRAMUSCULAR | Status: AC
  Administered 2012-10-18: 10 mg via INTRAVENOUS
  Filled 2012-10-18: qty 2

## 2012-10-18 MED ORDER — DIPHENHYDRAMINE HCL 50 MG/ML IJ SOLN
25.0000 mg | Freq: Once | INTRAMUSCULAR | Status: AC
  Administered 2012-10-18: 25 mg via INTRAVENOUS
  Filled 2012-10-18: qty 1

## 2012-10-18 MED ORDER — PROMETHAZINE HCL 12.5 MG PO TABS: 12.5000 mg | ORAL_TABLET | Freq: Four times a day (QID) | ORAL | Status: DC | PRN

## 2012-10-18 MED ORDER — SODIUM CHLORIDE 0.9 % IV SOLN: 1000.0000 mL | INTRAVENOUS | Status: DC

## 2012-10-18 MED ORDER — SODIUM CHLORIDE 0.9 % IV SOLN
1000.0000 mL | Freq: Once | INTRAVENOUS | Status: AC
  Administered 2012-10-18: 1000 mL via INTRAVENOUS

## 2012-10-18 NOTE — ED Notes (Signed)
Patient states she had a migraine for the past 2 days; states migraine has gone, but nausea continues.

## 2012-10-18 NOTE — ED Provider Notes (Signed)
CSN: [id number]     Arrival date & time 10/18/12  0010 History     First MD Initiated Contact with Patient 10/18/12 0048     Chief Complaint  Patient presents with  . Nausea   (Consider location/radiation/quality/duration/timing/severity/associated sxs/prior Treatment) The history is provided by the patient.   62 year old female had a migraine which started yesterday which resolved at about 1 PM. Migraine had nausea associated with it but no vomiting. Her headache has gone but she continues to have nausea without vomiting. Nothing makes it better nothing makes it worse. She normally takes Phenergan tablets for her migraine but had run out. She denies fever, chills, sweats. She denies chest pain, dyspnea, abdominal pain.  Past Medical History  Diagnosis Date  . Hypertension   . Migraine   . Arthritis   . GERD (gastroesophageal reflux disease)   . Panic attack   . Anxiety   . Panic disorder    Past Surgical History  Procedure Laterality Date  . Breast surgery    . Abdominal hysterectomy      partial   Family History  Problem Relation Age of Onset  . Hypertension Mother   . Diabetes Father    History  Substance Use Topics  . Smoking status: Former Smoker -- 2.00 packs/day for 30 years    Types: Cigarettes    Quit date: 04/01/2002  . Smokeless tobacco: Not on file  . Alcohol Use: No   OB History   Grav Para Term Preterm Abortions TAB SAB Ect Mult Living   4 3 1 2 1  1   3      Review of Systems  All other systems reviewed and are negative.    Allergies  Amoxicillin; Bactrim; Ciprofloxacin; Imitrex; Morphine and related; Tetracyclines & related; and Zoloft  Home Medications   Current Outpatient Rx  Name  Route  Sig  Dispense  Refill  . cyclobenzaprine (FLEXERIL) 10 MG tablet   Oral   Take 10 mg by mouth 3 (three) times daily as needed for muscle spasms.         . hydrOXYzine (ATARAX/VISTARIL) 25 MG tablet   Oral   Take 25 mg by mouth 3 (three) times daily  as needed for anxiety.         Marland Kitchen lisinopril (PRINIVIL,ZESTRIL) 5 MG tablet   Oral   Take 2.5 mg by mouth every morning.          . meclizine (ANTIVERT) 25 MG tablet   Oral   Take 25 mg by mouth 3 (three) times daily as needed for dizziness.         . propranolol (INDERAL) 10 MG tablet   Oral   Take 10 mg by mouth at bedtime.           . RABEprazole (ACIPHEX) 20 MG tablet   Oral   Take 20 mg by mouth every morning.          . promethazine (PHENERGAN) 12.5 MG tablet   Oral   Take 12.5 mg by mouth every 6 (six) hours as needed for nausea (only takes with a migraine).          BP 145/58  Pulse 78  Temp(Src) 98.6 F (37 C) (Oral)  Resp 20  Ht 5\' 8"  (1.727 m)  Wt 224 lb (101.606 kg)  BMI 34.07 kg/m2  SpO2 96% Physical Exam  Nursing note and vitals reviewed.  62 year old female, resting comfortably and in no acute distress. Vital  signs are significant for hypertension with blood pressure 145/58. Oxygen saturation is 96%, which is normal. Head is normocephalic and atraumatic. PERRLA, EOMI. Oropharynx is clear. Neck is nontender and supple without adenopathy or JVD. Back is nontender and there is no CVA tenderness. Lungs are clear without rales, wheezes, or rhonchi. Chest is nontender. Heart has regular rate and rhythm without murmur. Abdomen is soft, flat, nontender without masses or hepatosplenomegaly and peristalsis is hypoactive. Extremities have no cyanosis or edema, full range of motion is present. Skin is warm and dry without rash. Neurologic: Mental status is normal, cranial nerves are intact, there are no motor or sensory deficits.  ED Course   Procedures (including critical care time)  Results for orders placed during the hospital encounter of 10/18/12  CBC WITH DIFFERENTIAL      Result Value Range   WBC 7.0  4.0 - 10.5 K/uL   RBC 4.46  3.87 - 5.11 MIL/uL   Hemoglobin 13.6  12.0 - 15.0 g/dL   HCT 14.7  82.9 - 56.2 %   MCV 89.0  78.0 - 100.0 fL    MCH 30.5  26.0 - 34.0 pg   MCHC 34.3  30.0 - 36.0 g/dL   RDW 13.0  86.5 - 78.4 %   Platelets 218  150 - 400 K/uL   Neutrophils Relative % 60  43 - 77 %   Neutro Abs 4.1  1.7 - 7.7 K/uL   Lymphocytes Relative 33  12 - 46 %   Lymphs Abs 2.3  0.7 - 4.0 K/uL   Monocytes Relative 6  3 - 12 %   Monocytes Absolute 0.4  0.1 - 1.0 K/uL   Eosinophils Relative 1  0 - 5 %   Eosinophils Absolute 0.1  0.0 - 0.7 K/uL   Basophils Relative 0  0 - 1 %   Basophils Absolute 0.0  0.0 - 0.1 K/uL  COMPREHENSIVE METABOLIC PANEL      Result Value Range   Sodium 137  135 - 145 mEq/L   Potassium 3.6  3.5 - 5.1 mEq/L   Chloride 102  96 - 112 mEq/L   CO2 27  19 - 32 mEq/L   Glucose, Bld 103 (*) 70 - 99 mg/dL   BUN 9  6 - 23 mg/dL   Creatinine, Ser 6.96  0.50 - 1.10 mg/dL   Calcium 9.2  8.4 - 29.5 mg/dL   Total Protein 7.2  6.0 - 8.3 g/dL   Albumin 4.0  3.5 - 5.2 g/dL   AST 23  0 - 37 U/L   ALT 17  0 - 35 U/L   Alkaline Phosphatase 89  39 - 117 U/L   Total Bilirubin 0.4  0.3 - 1.2 mg/dL   GFR calc non Af Amer >90  >90 mL/min   GFR calc Af Amer >90  >90 mL/min   ECG shows normal sinus rhythm with a rate of 77, no ectopy. Normal axis. Normal P wave. Normal QRS. Normal intervals. Normal ST and T waves. Impression: normal ECG. Compared with ECG of 09/02/2012, no significant changes are seen.  1. Nausea   2. Migraine headache     MDM  Persistent nausea following migraine headache. ECG will be obtained to make sure this is not an angina equivalent and she will be given IV fluids plus IV metoclopramide and IV diphenhydramine. Old records are reviewed and she has no relevant past ED visits.  ECG is normal. She got excellent relief of nausea with  metoclopramide. She is discharged with a prescription for promethazine and is to followup with her PCP as needed.  Dione Booze, MD 10/18/12 [phone]

## 2012-12-20 ENCOUNTER — Encounter (HOSPITAL_COMMUNITY): Payer: Self-pay | Admitting: Emergency Medicine

## 2012-12-20 ENCOUNTER — Emergency Department (HOSPITAL_COMMUNITY)
Admission: EM | Admit: 2012-12-20 | Discharge: 2012-12-20 | Disposition: A | Discharge status: Home / Self Care | Payer: Medicaid Other | Attending: Emergency Medicine | Admitting: Emergency Medicine

## 2012-12-20 DIAGNOSIS — M545 Low back pain, unspecified: Secondary | ICD-10-CM | POA: Insufficient documentation

## 2012-12-20 DIAGNOSIS — M549 Dorsalgia, unspecified: Secondary | ICD-10-CM

## 2012-12-20 DIAGNOSIS — Z880 Allergy status to penicillin: Secondary | ICD-10-CM | POA: Insufficient documentation

## 2012-12-20 DIAGNOSIS — I10 Essential (primary) hypertension: Secondary | ICD-10-CM | POA: Insufficient documentation

## 2012-12-20 DIAGNOSIS — R300 Dysuria: Secondary | ICD-10-CM | POA: Insufficient documentation

## 2012-12-20 DIAGNOSIS — K219 Gastro-esophageal reflux disease without esophagitis: Secondary | ICD-10-CM | POA: Insufficient documentation

## 2012-12-20 DIAGNOSIS — F410 Panic disorder [episodic paroxysmal anxiety] without agoraphobia: Secondary | ICD-10-CM | POA: Insufficient documentation

## 2012-12-20 DIAGNOSIS — Z8739 Personal history of other diseases of the musculoskeletal system and connective tissue: Secondary | ICD-10-CM | POA: Insufficient documentation

## 2012-12-20 DIAGNOSIS — G43909 Migraine, unspecified, not intractable, without status migrainosus: Secondary | ICD-10-CM | POA: Insufficient documentation

## 2012-12-20 DIAGNOSIS — Z79899 Other long term (current) drug therapy: Secondary | ICD-10-CM | POA: Insufficient documentation

## 2012-12-20 DIAGNOSIS — Z87891 Personal history of nicotine dependence: Secondary | ICD-10-CM | POA: Insufficient documentation

## 2012-12-20 LAB — URINALYSIS, ROUTINE W REFLEX MICROSCOPIC
Nitrite: NEGATIVE
Specific Gravity, Urine: 1.025 (ref 1.005–1.030)
Urobilinogen, UA: 0.2 mg/dL (ref 0.0–1.0)
pH: 6 (ref 5.0–8.0)

## 2012-12-20 LAB — URINE MICROSCOPIC-ADD ON

## 2012-12-20 MED ORDER — ACETAMINOPHEN-CODEINE #3 300-30 MG PO TABS: 1.0000 | ORAL_TABLET | Freq: Four times a day (QID) | ORAL | Status: DC | PRN

## 2012-12-20 MED ORDER — ACETAMINOPHEN-CODEINE #3 300-30 MG PO TABS
1.0000 | ORAL_TABLET | Freq: Once | ORAL | Status: AC
  Administered 2012-12-20: 1 via ORAL
  Filled 2012-12-20: qty 1

## 2012-12-20 MED ORDER — CYCLOBENZAPRINE HCL 10 MG PO TABS
10.0000 mg | ORAL_TABLET | Freq: Once | ORAL | Status: AC
  Administered 2012-12-20: 10 mg via ORAL
  Filled 2012-12-20: qty 1

## 2012-12-20 MED ORDER — ONDANSETRON HCL 4 MG PO TABS
4.0000 mg | ORAL_TABLET | Freq: Once | ORAL | Status: AC
  Administered 2012-12-20: 4 mg via ORAL
  Filled 2012-12-20: qty 1

## 2012-12-20 NOTE — ED Provider Notes (Signed)
CSN: [id number]     Arrival date & time 12/20/12  2021 History   First MD Initiated Contact with Patient 12/20/12 2156     Chief Complaint  Patient presents with  . Back Pain   (Consider location/radiation/quality/duration/timing/severity/associated sxs/prior Treatment) HPI Comments: Patient is a 62 year old female who states that she woke up this morning with and aching pain in her lower back. The patient states she does not recall any recent injury or trauma to the back. She does not recall doing any heavy lifting pushing or pulling. She states that she has arthritis and from time to time she has spasm of her back. She was seen at one of the local clinics this week and was actually prescribed Flexeril for her back but has not gotten that filled.  The patient is also concerned that she may have a urinary tract infection because of some mild urinary symptoms. There's been no high fever, his been no nausea vomiting, there's been no hematuria reported.  Patient is a 62 y.o. female presenting with back pain. The history is provided by the patient.  Back Pain Associated symptoms: dysuria and headaches   Associated symptoms: no abdominal pain and no chest pain     Past Medical History  Diagnosis Date  . Hypertension   . Migraine   . Arthritis   . GERD (gastroesophageal reflux disease)   . Panic attack   . Anxiety   . Panic disorder    Past Surgical History  Procedure Laterality Date  . Breast surgery    . Abdominal hysterectomy      partial   Family History  Problem Relation Age of Onset  . Hypertension Mother   . Diabetes Father    History  Substance Use Topics  . Smoking status: Former Smoker -- 2.00 packs/day for 30 years    Types: Cigarettes    Quit date: 04/01/2002  . Smokeless tobacco: Not on file  . Alcohol Use: No   OB History   Grav Para Term Preterm Abortions TAB SAB Ect Mult Living   4 3 1 2 1  1   3      Review of Systems  Constitutional: Negative for  activity change.       All ROS Neg except as noted in HPI  HENT: Negative for nosebleeds.   Eyes: Negative for photophobia and discharge.  Respiratory: Negative for cough, shortness of breath and wheezing.   Cardiovascular: Negative for chest pain and palpitations.  Gastrointestinal: Negative for abdominal pain and blood in stool.  Genitourinary: Positive for dysuria. Negative for frequency and hematuria.  Musculoskeletal: Positive for back pain. Negative for arthralgias and neck pain.  Skin: Negative.   Neurological: Positive for headaches. Negative for dizziness, seizures and speech difficulty.  Psychiatric/Behavioral: Negative for hallucinations and confusion. The patient is nervous/anxious.     Allergies  Amoxicillin; Bactrim; Ciprofloxacin; Imitrex; Morphine and related; Tetracyclines & related; and Zoloft  Home Medications   Current Outpatient Rx  Name  Route  Sig  Dispense  Refill  . cyclobenzaprine (FLEXERIL) 10 MG tablet   Oral   Take 10 mg by mouth 3 (three) times daily as needed for muscle spasms.         . hydrOXYzine (ATARAX/VISTARIL) 25 MG tablet   Oral   Take 25 mg by mouth 3 (three) times daily as needed for anxiety.         Marland Kitchen lisinopril (PRINIVIL,ZESTRIL) 5 MG tablet   Oral   Take 2.5 mg  by mouth every morning.          . propranolol (INDERAL) 10 MG tablet   Oral   Take 10 mg by mouth at bedtime.           . RABEprazole (ACIPHEX) 20 MG tablet   Oral   Take 20 mg by mouth every morning.          . meclizine (ANTIVERT) 25 MG tablet   Oral   Take 25 mg by mouth 3 (three) times daily as needed for dizziness.         . promethazine (PHENERGAN) 12.5 MG tablet   Oral   Take 1 tablet (12.5 mg total) by mouth every 6 (six) hours as needed for nausea.   30 tablet   0    BP 142/70  Pulse 71  Temp(Src) 98.1 F (36.7 C) (Oral)  Resp 20  Ht 5\' 8"  (1.727 m)  Wt 225 lb (102.059 kg)  BMI 34.22 kg/m2  SpO2 99% Physical Exam  Nursing note and  vitals reviewed. Constitutional: She is oriented to person, place, and time. She appears well-developed and well-nourished.  Non-toxic appearance.  HENT:  Head: Normocephalic.  Right Ear: Tympanic membrane and external ear normal.  Left Ear: Tympanic membrane and external ear normal.  Eyes: EOM and lids are normal. Pupils are equal, round, and reactive to light.  Neck: Normal range of motion. Neck supple. Carotid bruit is not present.  Cardiovascular: Normal rate, regular rhythm, normal heart sounds, intact distal pulses and normal pulses.   Pulmonary/Chest: Breath sounds normal. No respiratory distress.  Abdominal: Soft. Bowel sounds are normal. There is no tenderness. There is no guarding.  No CVA tenderness.  Musculoskeletal: Normal range of motion.  There is paraspinal tightness and tenderness of the upper to mid lumbar area. There is mild spasm with attempted range of motion. There is no palpable step off. There no hot areas of the lumbar spine area.  Lymphadenopathy:       Head (right side): No submandibular adenopathy present.       Head (left side): No submandibular adenopathy present.    She has no cervical adenopathy.  Neurological: She is alert and oriented to person, place, and time. She has normal strength. No cranial nerve deficit or sensory deficit. She exhibits normal muscle tone. Coordination normal.  Skin: Skin is warm and dry.  Psychiatric: She has a normal mood and affect. Her speech is normal.    ED Course  Procedures (including critical care time) Labs Review Labs Reviewed  URINALYSIS, ROUTINE W REFLEX MICROSCOPIC - Abnormal; Notable for the following:    Leukocytes, UA SMALL (*)    All other components within normal limits  URINE MICROSCOPIC-ADD ON   Imaging Review No results found.  EKG Interpretation   None       MDM  No diagnosis found. *I have reviewed nursing notes, vital signs, and all appropriate lab and imaging results for this  patient.**  No gross neuro deficit noted. UA  Reveals small leukocytes and 3-6 wbc, rare bacteria. Pt to have this rechecked by her pcp or health department. Rx for tylenol codeine given to the patient . She is to follow up  With pcp concerning this as well.  Kathie Dike, PA-C 12/24/12 1138

## 2012-12-20 NOTE — ED Notes (Signed)
Patient states she woke up this morning with back pain; states it is sore and achy.  Patient denies any radiation, nausea or vomiting.

## 2012-12-22 ENCOUNTER — Emergency Department (HOSPITAL_COMMUNITY)
Admission: EM | Admit: 2012-12-22 | Discharge: 2012-12-22 | Disposition: A | Discharge status: Home / Self Care | Payer: Medicaid Other | Attending: Emergency Medicine | Admitting: Emergency Medicine

## 2012-12-22 ENCOUNTER — Encounter (HOSPITAL_COMMUNITY): Payer: Self-pay | Admitting: Emergency Medicine

## 2012-12-22 DIAGNOSIS — J3489 Other specified disorders of nose and nasal sinuses: Secondary | ICD-10-CM | POA: Insufficient documentation

## 2012-12-22 DIAGNOSIS — G43909 Migraine, unspecified, not intractable, without status migrainosus: Secondary | ICD-10-CM | POA: Insufficient documentation

## 2012-12-22 DIAGNOSIS — Z880 Allergy status to penicillin: Secondary | ICD-10-CM | POA: Insufficient documentation

## 2012-12-22 DIAGNOSIS — Z8739 Personal history of other diseases of the musculoskeletal system and connective tissue: Secondary | ICD-10-CM | POA: Insufficient documentation

## 2012-12-22 DIAGNOSIS — F410 Panic disorder [episodic paroxysmal anxiety] without agoraphobia: Secondary | ICD-10-CM | POA: Insufficient documentation

## 2012-12-22 DIAGNOSIS — I10 Essential (primary) hypertension: Secondary | ICD-10-CM | POA: Insufficient documentation

## 2012-12-22 DIAGNOSIS — IMO0001 Reserved for inherently not codable concepts without codable children: Secondary | ICD-10-CM | POA: Insufficient documentation

## 2012-12-22 DIAGNOSIS — Z87891 Personal history of nicotine dependence: Secondary | ICD-10-CM | POA: Insufficient documentation

## 2012-12-22 DIAGNOSIS — R5381 Other malaise: Secondary | ICD-10-CM | POA: Insufficient documentation

## 2012-12-22 DIAGNOSIS — R509 Fever, unspecified: Secondary | ICD-10-CM | POA: Insufficient documentation

## 2012-12-22 DIAGNOSIS — J029 Acute pharyngitis, unspecified: Secondary | ICD-10-CM | POA: Insufficient documentation

## 2012-12-22 DIAGNOSIS — K219 Gastro-esophageal reflux disease without esophagitis: Secondary | ICD-10-CM | POA: Insufficient documentation

## 2012-12-22 DIAGNOSIS — Z79899 Other long term (current) drug therapy: Secondary | ICD-10-CM | POA: Insufficient documentation

## 2012-12-22 DIAGNOSIS — R52 Pain, unspecified: Secondary | ICD-10-CM | POA: Insufficient documentation

## 2012-12-22 MED ORDER — MAGIC MOUTHWASH W/LIDOCAINE: 10.0000 mL | Freq: Four times a day (QID) | ORAL | Status: DC | PRN

## 2012-12-22 MED ORDER — AZITHROMYCIN 250 MG PO TABS: 250.0000 mg | ORAL_TABLET | Freq: Every day | ORAL | Status: DC

## 2012-12-22 NOTE — ED Provider Notes (Signed)
Medical screening examination/treatment/procedure(s) were performed by non-physician practitioner and as supervising physician I was immediately available for consultation/collaboration.   Lyanne Co, MD 12/22/12 [phone]

## 2012-12-22 NOTE — ED Notes (Signed)
Pt reports sore throat and generalized body aches for 2 days.

## 2012-12-22 NOTE — ED Provider Notes (Signed)
CSN: [id number]     Arrival date & time 12/22/12  1638 History   First MD Initiated Contact with Patient 12/22/12 1709     Chief Complaint  Patient presents with  . Sore Throat  . Generalized Body Aches   (Consider location/radiation/quality/duration/timing/severity/associated sxs/prior Treatment) Patient is a 62 y.o. female presenting with pharyngitis. The history is provided by the patient.  Sore Throat This is a new problem. Episode onset: 2 days ago. The problem occurs constantly. The problem has been unchanged. Associated symptoms include chills, fatigue, a fever, myalgias, a sore throat and swollen glands. Pertinent negatives include no abdominal pain, arthralgias, chest pain, congestion, coughing, diaphoresis, headaches, nausea or vomiting. The symptoms are aggravated by swallowing. She has tried nothing for the symptoms.    Past Medical History  Diagnosis Date  . Hypertension   . Migraine   . Arthritis   . GERD (gastroesophageal reflux disease)   . Panic attack   . Anxiety   . Panic disorder    Past Surgical History  Procedure Laterality Date  . Breast surgery    . Abdominal hysterectomy      partial   Family History  Problem Relation Age of Onset  . Hypertension Mother   . Diabetes Father    History  Substance Use Topics  . Smoking status: Former Smoker -- 2.00 packs/day for 30 years    Types: Cigarettes    Quit date: 04/01/2002  . Smokeless tobacco: Not on file  . Alcohol Use: No   OB History   Grav Para Term Preterm Abortions TAB SAB Ect Mult Living   4 3 1 2 1  1   3      Review of Systems  Constitutional: Positive for fever, chills and fatigue. Negative for diaphoresis.  HENT: Positive for rhinorrhea and sore throat. Negative for congestion, ear pain, sinus pressure, trouble swallowing and voice change.   Eyes: Negative for discharge.  Respiratory: Negative for cough, shortness of breath, wheezing and stridor.   Cardiovascular: Negative for chest  pain.  Gastrointestinal: Negative for nausea, vomiting and abdominal pain.  Genitourinary: Negative.   Musculoskeletal: Positive for myalgias. Negative for arthralgias.  Neurological: Negative for headaches.    Allergies  Amoxicillin; Bactrim; Ciprofloxacin; Imitrex; Morphine and related; Tetracyclines & related; and Zoloft  Home Medications   Current Outpatient Rx  Name  Route  Sig  Dispense  Refill  . cyclobenzaprine (FLEXERIL) 10 MG tablet   Oral   Take 10 mg by mouth 3 (three) times daily as needed for muscle spasms.         . hydrOXYzine (ATARAX/VISTARIL) 25 MG tablet   Oral   Take 25 mg by mouth 3 (three) times daily as needed for anxiety.         Marland Kitchen lisinopril (PRINIVIL,ZESTRIL) 5 MG tablet   Oral   Take 2.5 mg by mouth every morning.          . meclizine (ANTIVERT) 25 MG tablet   Oral   Take 25 mg by mouth 3 (three) times daily as needed for dizziness.         . promethazine (PHENERGAN) 12.5 MG tablet   Oral   Take 1 tablet (12.5 mg total) by mouth every 6 (six) hours as needed for nausea.   30 tablet   0   . propranolol (INDERAL) 10 MG tablet   Oral   Take 10 mg by mouth at bedtime.           Marland Kitchen  RABEprazole (ACIPHEX) 20 MG tablet   Oral   Take 20 mg by mouth every morning.          . Alum & Mag Hydroxide-Simeth (MAGIC MOUTHWASH W/LIDOCAINE) SOLN   Oral   Take 10 mLs by mouth 4 (four) times daily as needed (throat pain).   120 mL   0     Equal parts ingredients   . azithromycin (ZITHROMAX) 250 MG tablet   Oral   Take 1 tablet (250 mg total) by mouth daily. Take first 2 tablets together, then 1 every day until finished.   6 tablet   0    BP 131/60  Pulse 88  Temp(Src) 98.1 F (36.7 C) (Oral)  Resp 18  Ht 5\' 8"  (1.727 m)  Wt 225 lb (102.059 kg)  BMI 34.22 kg/m2  SpO2 99% Physical Exam  Constitutional: She is oriented to person, place, and time. She appears well-developed and well-nourished.  HENT:  Head: Normocephalic and  atraumatic.  Right Ear: Tympanic membrane and ear canal normal.  Left Ear: Tympanic membrane and ear canal normal.  Nose: No mucosal edema or rhinorrhea.  Mouth/Throat: Uvula is midline and mucous membranes are normal. Posterior oropharyngeal edema and posterior oropharyngeal erythema present. No oropharyngeal exudate or tonsillar abscesses.  Bilateral tonsils 1+, scattered petechiae soft palate  Eyes: Conjunctivae are normal.  Cardiovascular: Normal rate and normal heart sounds.   Pulmonary/Chest: Effort normal. No respiratory distress. She has no wheezes. She has no rales.  Abdominal: Soft. There is no tenderness.  Musculoskeletal: Normal range of motion.  Neurological: She is alert and oriented to person, place, and time.  Skin: Skin is warm and dry. No rash noted.  Psychiatric: She has a normal mood and affect.    ED Course  Procedures (including critical care time) Labs Review Labs Reviewed - No data to display Imaging Review No results found.  EKG Interpretation   None       MDM   1. Pharyngitis, acute    Pt with exam findings consistent with strep throat.  No uri sx to suggest viral source.  Will cover with zithromax,  Also prescribed magic mouthwash.  Encouraged fluids,  Rest, tylenol.  Recheck by pcp if not improved over the next week.    Burgess Amor, PA-C 12/22/12 [id number]

## 2012-12-24 NOTE — ED Provider Notes (Signed)
Medical screening examination/treatment/procedure(s) were performed by non-physician practitioner and as supervising physician I was immediately available for consultation/collaboration.   Dontae Minerva W. Khamani Daniely, MD 12/24/12 1615 

## 2013-02-11 ENCOUNTER — Encounter (HOSPITAL_COMMUNITY): Payer: Self-pay | Admitting: Emergency Medicine

## 2013-02-11 ENCOUNTER — Emergency Department (HOSPITAL_COMMUNITY)
Admission: EM | Admit: 2013-02-11 | Discharge: 2013-02-12 | Disposition: A | Discharge status: Home / Self Care | Payer: Medicaid Other | Attending: Emergency Medicine | Admitting: Emergency Medicine

## 2013-02-11 DIAGNOSIS — K219 Gastro-esophageal reflux disease without esophagitis: Secondary | ICD-10-CM | POA: Insufficient documentation

## 2013-02-11 DIAGNOSIS — F410 Panic disorder [episodic paroxysmal anxiety] without agoraphobia: Secondary | ICD-10-CM | POA: Insufficient documentation

## 2013-02-11 DIAGNOSIS — I889 Nonspecific lymphadenitis, unspecified: Secondary | ICD-10-CM

## 2013-02-11 DIAGNOSIS — Z880 Allergy status to penicillin: Secondary | ICD-10-CM | POA: Insufficient documentation

## 2013-02-11 DIAGNOSIS — I10 Essential (primary) hypertension: Secondary | ICD-10-CM | POA: Insufficient documentation

## 2013-02-11 DIAGNOSIS — Z87891 Personal history of nicotine dependence: Secondary | ICD-10-CM | POA: Insufficient documentation

## 2013-02-11 DIAGNOSIS — M129 Arthropathy, unspecified: Secondary | ICD-10-CM | POA: Insufficient documentation

## 2013-02-11 DIAGNOSIS — Z79899 Other long term (current) drug therapy: Secondary | ICD-10-CM | POA: Insufficient documentation

## 2013-02-11 DIAGNOSIS — G43909 Migraine, unspecified, not intractable, without status migrainosus: Secondary | ICD-10-CM | POA: Insufficient documentation

## 2013-02-11 DIAGNOSIS — H9209 Otalgia, unspecified ear: Secondary | ICD-10-CM | POA: Insufficient documentation

## 2013-02-11 DIAGNOSIS — R1310 Dysphagia, unspecified: Secondary | ICD-10-CM

## 2013-02-11 MED ORDER — NAPROXEN 500 MG PO TABS: 500.0000 mg | ORAL_TABLET | Freq: Two times a day (BID) | ORAL | Status: DC

## 2013-02-11 MED ORDER — AZITHROMYCIN 250 MG PO TABS: 250.0000 mg | ORAL_TABLET | Freq: Every day | ORAL | Status: DC

## 2013-02-11 NOTE — ED Notes (Signed)
Pt c/o rt ear pain and rt sided neck pain. Pt c/o rt sided throat redness.

## 2013-02-11 NOTE — ED Provider Notes (Signed)
CSN: [id number]     Arrival date & time 02/11/13  2322 History   First MD Initiated Contact with Patient 02/11/13 2342     Chief Complaint  Patient presents with  . Otalgia  . Neck Pain   (Consider location/radiation/quality/duration/timing/severity/associated sxs/prior Treatment) HPI Comments: 62 year old female presents with 2 days of right throat and ear pain. Gradual in onset, persistent, worse with swallowing, no associated fevers coughing nasal drainage sneezing or rashes. No sick contacts  Patient is a 62 y.o. female presenting with ear pain and neck pain. The history is provided by the patient and a relative.  Otalgia Associated symptoms: neck pain   Associated symptoms: no fever and no vomiting   Neck Pain Associated symptoms: no fever     Past Medical History  Diagnosis Date  . Hypertension   . Migraine   . Arthritis   . GERD (gastroesophageal reflux disease)   . Panic attack   . Anxiety   . Panic disorder    Past Surgical History  Procedure Laterality Date  . Breast surgery    . Abdominal hysterectomy      partial   Family History  Problem Relation Age of Onset  . Hypertension Mother   . Diabetes Father    History  Substance Use Topics  . Smoking status: Former Smoker -- 2.00 packs/day for 30 years    Types: Cigarettes    Quit date: 04/01/2002  . Smokeless tobacco: Not on file  . Alcohol Use: No   OB History   Grav Para Term Preterm Abortions TAB SAB Ect Mult Living   4 3 1 2 1  1   3      Review of Systems  Constitutional: Negative for fever.  HENT: Positive for ear pain and trouble swallowing. Negative for voice change.   Gastrointestinal: Negative for nausea and vomiting.  Musculoskeletal: Positive for neck pain.    Allergies  Amoxicillin; Bactrim; Ciprofloxacin; Imitrex; Morphine and related; Tetracyclines & related; and Zoloft  Home Medications   Current Outpatient Rx  Name  Route  Sig  Dispense  Refill  . cyclobenzaprine (FLEXERIL) 10  MG tablet   Oral   Take 10 mg by mouth 3 (three) times daily as needed for muscle spasms.         . hydrOXYzine (ATARAX/VISTARIL) 25 MG tablet   Oral   Take 25 mg by mouth 3 (three) times daily as needed for anxiety.         Marland Kitchen lisinopril (PRINIVIL,ZESTRIL) 5 MG tablet   Oral   Take 2.5 mg by mouth every morning.          . meclizine (ANTIVERT) 25 MG tablet   Oral   Take 25 mg by mouth 3 (three) times daily as needed for dizziness.         . promethazine (PHENERGAN) 12.5 MG tablet   Oral   Take 1 tablet (12.5 mg total) by mouth every 6 (six) hours as needed for nausea.   30 tablet   0   . propranolol (INDERAL) 10 MG tablet   Oral   Take 10 mg by mouth at bedtime.           . RABEprazole (ACIPHEX) 20 MG tablet   Oral   Take 20 mg by mouth every morning.          . Alum & Mag Hydroxide-Simeth (MAGIC MOUTHWASH W/LIDOCAINE) SOLN   Oral   Take 10 mLs by mouth 4 (four) times daily as  needed (throat pain).   120 mL   0     Equal parts ingredients   . azithromycin (ZITHROMAX Z-PAK) 250 MG tablet   Oral   Take 1 tablet (250 mg total) by mouth daily. 500mg  PO day 1, then 250mg  PO days 205   6 tablet   0   . azithromycin (ZITHROMAX) 250 MG tablet   Oral   Take 1 tablet (250 mg total) by mouth daily. Take first 2 tablets together, then 1 every day until finished.   6 tablet   0   . naproxen (NAPROSYN) 500 MG tablet   Oral   Take 1 tablet (500 mg total) by mouth 2 (two) times daily with a meal.   30 tablet   0    BP 136/82  Pulse 68  Temp(Src) 97.9 F (36.6 C) (Oral)  Resp 20  Ht 5\' 8"  (1.727 m)  Wt 225 lb (102.059 kg)  BMI 34.22 kg/m2  SpO2 98% Physical Exam  Nursing note and vitals reviewed. Constitutional: She appears well-developed and well-nourished. No distress.  HENT:  Head: Normocephalic and atraumatic.  Mouth/Throat: Oropharynx is clear and moist. No oropharyngeal exudate.  Clear tympanic membranes bilaterally, external auditory canal is  clear bilaterally, no pain with manipulation of the tragus or the auricle on the right. Oropharynx is clear without exudate asymmetry hypertrophy or erythema. Nasal passages are clear.  Eyes: Conjunctivae and EOM are normal. Pupils are equal, round, and reactive to light. Right eye exhibits no discharge. Left eye exhibits no discharge. No scleral icterus.  Neck: Normal range of motion. Neck supple. No JVD present. No thyromegaly present.  Mild lymphadenopathy in the right anterior cervical chain, this is palpable, mobile, rubbery. There is no erythematous, no induration or cellulitis. Neck is otherwise supple  Cardiovascular: Normal rate, regular rhythm, normal heart sounds and intact distal pulses.  Exam reveals no gallop and no friction rub.   No murmur heard. Pulmonary/Chest: Effort normal and breath sounds normal. No respiratory distress. She has no wheezes. She has no rales.  Musculoskeletal: Normal range of motion. She exhibits no edema and no tenderness.  Lymphadenopathy:    She has cervical adenopathy.  Neurological: She is alert. Coordination normal.  Skin: Skin is warm and dry. No rash noted. No erythema.  Psychiatric: She has a normal mood and affect. Her behavior is normal.    ED Course  Procedures (including critical care time) Labs Review Labs Reviewed - No data to display Imaging Review No results found.  EKG Interpretation   None       MDM   1. Lymphadenitis   2. Odynophagia    The patient has no trismus or torticollis, she is able to swallow though she does have mild discomfort with this. She does not have otitis media or any other signs of otitis externa but does have a likely pharyngitis with referred pain to the ear. There is a lymphadenitis of the neck, no signs of strep throat, no fever, the patient otherwise appears stable.   Meds given in ED:  Medications - No data to display  New Prescriptions   AZITHROMYCIN (ZITHROMAX Z-PAK) 250 MG TABLET    Take 1  tablet (250 mg total) by mouth daily. 500mg  PO day 1, then 250mg  PO days 205   NAPROXEN (NAPROSYN) 500 MG TABLET    Take 1 tablet (500 mg total) by mouth 2 (two) times daily with a meal.        Vida Roller,  MD 02/11/13 2352

## 2013-05-18 ENCOUNTER — Emergency Department (HOSPITAL_COMMUNITY)
Admission: EM | Admit: 2013-05-18 | Discharge: 2013-05-19 | Disposition: A | Discharge status: Home / Self Care | Payer: Medicaid Other | Attending: Emergency Medicine | Admitting: Emergency Medicine

## 2013-05-18 ENCOUNTER — Encounter (HOSPITAL_COMMUNITY): Payer: Self-pay | Admitting: Emergency Medicine

## 2013-05-18 DIAGNOSIS — Z791 Long term (current) use of non-steroidal anti-inflammatories (NSAID): Secondary | ICD-10-CM | POA: Insufficient documentation

## 2013-05-18 DIAGNOSIS — H9209 Otalgia, unspecified ear: Secondary | ICD-10-CM | POA: Insufficient documentation

## 2013-05-18 DIAGNOSIS — I10 Essential (primary) hypertension: Secondary | ICD-10-CM | POA: Insufficient documentation

## 2013-05-18 DIAGNOSIS — G43909 Migraine, unspecified, not intractable, without status migrainosus: Secondary | ICD-10-CM | POA: Insufficient documentation

## 2013-05-18 DIAGNOSIS — Z79899 Other long term (current) drug therapy: Secondary | ICD-10-CM | POA: Insufficient documentation

## 2013-05-18 DIAGNOSIS — K219 Gastro-esophageal reflux disease without esophagitis: Secondary | ICD-10-CM | POA: Insufficient documentation

## 2013-05-18 DIAGNOSIS — J069 Acute upper respiratory infection, unspecified: Secondary | ICD-10-CM | POA: Insufficient documentation

## 2013-05-18 DIAGNOSIS — Z792 Long term (current) use of antibiotics: Secondary | ICD-10-CM | POA: Insufficient documentation

## 2013-05-18 DIAGNOSIS — R5383 Other fatigue: Secondary | ICD-10-CM

## 2013-05-18 DIAGNOSIS — Z880 Allergy status to penicillin: Secondary | ICD-10-CM | POA: Insufficient documentation

## 2013-05-18 DIAGNOSIS — J329 Chronic sinusitis, unspecified: Secondary | ICD-10-CM | POA: Insufficient documentation

## 2013-05-18 DIAGNOSIS — F410 Panic disorder [episodic paroxysmal anxiety] without agoraphobia: Secondary | ICD-10-CM | POA: Insufficient documentation

## 2013-05-18 DIAGNOSIS — M129 Arthropathy, unspecified: Secondary | ICD-10-CM | POA: Insufficient documentation

## 2013-05-18 DIAGNOSIS — R5381 Other malaise: Secondary | ICD-10-CM | POA: Insufficient documentation

## 2013-05-18 DIAGNOSIS — Z87891 Personal history of nicotine dependence: Secondary | ICD-10-CM | POA: Insufficient documentation

## 2013-05-18 NOTE — ED Notes (Signed)
Patient c/o fever, sore throat, right ear pain and upper airway congestion.

## 2013-05-19 ENCOUNTER — Emergency Department (HOSPITAL_COMMUNITY): Payer: Medicaid Other

## 2013-05-19 MED ORDER — GUAIFENESIN 100 MG/5ML PO LIQD: 100.0000 mg | ORAL | Status: DC | PRN

## 2013-05-19 MED ORDER — OXYMETAZOLINE HCL 0.05 % NA SOLN: 1.0000 | Freq: Two times a day (BID) | NASAL | Status: DC

## 2013-05-19 MED ORDER — MOMETASONE FUROATE 50 MCG/ACT NA SUSP
2.0000 | Freq: Every day | NASAL | Status: DC
Start: 2013-05-19 — End: 2013-08-06

## 2013-05-19 MED ORDER — AZITHROMYCIN 250 MG PO TABS: ORAL_TABLET | ORAL | Status: DC

## 2013-05-19 NOTE — Discharge Instructions (Signed)
Sinusitis Sinusitis is redness, soreness, and swelling (inflammation) of the paranasal sinuses. Paranasal sinuses are air pockets within the bones of your face (beneath the eyes, the middle of the forehead, or above the eyes). In healthy paranasal sinuses, mucus is able to drain out, and air is able to circulate through them by way of your nose. However, when your paranasal sinuses are inflamed, mucus and air can become trapped. This can allow bacteria and other germs to grow and cause infection. Sinusitis can develop quickly and last only a short time (acute) or continue over a long period (chronic). Sinusitis that lasts for more than 12 weeks is considered chronic.  CAUSES  Causes of sinusitis include:  Allergies.  Structural abnormalities, such as displacement of the cartilage that separates your nostrils (deviated septum), which can decrease the air flow through your nose and sinuses and affect sinus drainage.  Functional abnormalities, such as when the small hairs (cilia) that line your sinuses and help remove mucus do not work properly or are not present. SYMPTOMS  Symptoms of acute and chronic sinusitis are the same. The primary symptoms are pain and pressure around the affected sinuses. Other symptoms include:  Upper toothache.  Earache.  Headache.  Bad breath.  Decreased sense of smell and taste.  A cough, which worsens when you are lying flat.  Fatigue.  Fever.  Thick drainage from your nose, which often is green and may contain pus (purulent).  Swelling and warmth over the affected sinuses. DIAGNOSIS  Your caregiver will perform a physical exam. During the exam, your caregiver may:  Look in your nose for signs of abnormal growths in your nostrils (nasal polyps).  Tap over the affected sinus to check for signs of infection.  View the inside of your sinuses (endoscopy) with a special imaging device with a light attached (endoscope), which is inserted into your  sinuses. If your caregiver suspects that you have chronic sinusitis, one or more of the following tests may be recommended:  Allergy tests.  Nasal culture A sample of mucus is taken from your nose and sent to a lab and screened for bacteria.  Nasal cytology A sample of mucus is taken from your nose and examined by your caregiver to determine if your sinusitis is related to an allergy. TREATMENT  Most cases of acute sinusitis are related to a viral infection and will resolve on their own within 10 days. Sometimes medicines are prescribed to help relieve symptoms (pain medicine, decongestants, nasal steroid sprays, or saline sprays).  However, for sinusitis related to a bacterial infection, your caregiver will prescribe antibiotic medicines. These are medicines that will help kill the bacteria causing the infection.  Rarely, sinusitis is caused by a fungal infection. In theses cases, your caregiver will prescribe antifungal medicine. For some cases of chronic sinusitis, surgery is needed. Generally, these are cases in which sinusitis recurs more than 3 times per year, despite other treatments. HOME CARE INSTRUCTIONS   Drink plenty of water. Water helps thin the mucus so your sinuses can drain more easily.  Use a humidifier.  Inhale steam 3 to 4 times a day (for example, sit in the bathroom with the shower running).  Apply a warm, moist washcloth to your face 3 to 4 times a day, or as directed by your caregiver.  Use saline nasal sprays to help moisten and clean your sinuses.  Take over-the-counter or prescription medicines for pain, discomfort, or fever only as directed by your caregiver. [place]  or as directed by your caregiver.  · Use saline nasal sprays to help moisten and clean your sinuses.  · Take over-the-counter or prescription medicines for pain, discomfort, or fever only as directed by your caregiver.  SEEK IMMEDIATE MEDICAL CARE IF:  · You have increasing pain or severe headaches.  · You have nausea, vomiting, or drowsiness.  · You have swelling around your face.  · You have vision problems.  · You have a stiff neck.  · You have difficulty breathing.  MAKE SURE YOU:   · Understand these  instructions.  · Will watch your condition.  · Will get help right away if you are not doing well or get worse.  Document Released: 02/21/2005 Document Revised: 05/16/2011 Document Reviewed: 03/08/2011  ExitCare® Patient Information ©2014 [employer].  Upper Respiratory Infection, Adult  An upper respiratory infection (URI) is also sometimes known as the common cold. The upper respiratory tract includes the nose, sinuses, throat, trachea, and bronchi. Bronchi are the airways leading to the lungs. Most people improve within 1 week, but symptoms can last up to 2 weeks. A residual cough may last even longer.   CAUSES  Many different viruses can infect the tissues lining the upper respiratory tract. The tissues become irritated and inflamed and often become very moist. Mucus production is also common. A cold is contagious. You can easily spread the virus to others by oral contact. This includes kissing, sharing a glass, coughing, or sneezing. Touching your mouth or nose and then touching a surface, which is then touched by another person, can also spread the virus.  SYMPTOMS   Symptoms typically develop 1 to 3 days after you come in contact with a cold virus. Symptoms vary from person to person. They may include:  · Runny nose.  · Sneezing.  · Nasal congestion.  · Sinus irritation.  · Sore throat.  · Loss of voice (laryngitis).  · Cough.  · Fatigue.  · Muscle aches.  · Loss of appetite.  · Headache.  · Low-grade fever.  DIAGNOSIS   You might diagnose your own cold based on familiar symptoms, since most people get a cold 2 to 3 times a year. Your caregiver can confirm this based on your exam. Most importantly, your caregiver can check that your symptoms are not due to another disease such as strep throat, sinusitis, pneumonia, asthma, or epiglottitis. Blood tests, throat tests, and X-rays are not necessary to diagnose a common cold, but they may sometimes be helpful in excluding other more serious diseases. Your  caregiver will decide if any further tests are required.  RISKS AND COMPLICATIONS   You may be at risk for a more severe case of the common cold if you smoke cigarettes, have chronic heart disease (such as heart failure) or lung disease (such as asthma), or if you have a weakened immune system. The very young and very old are also at risk for more serious infections. Bacterial sinusitis, middle ear infections, and bacterial pneumonia can complicate the common cold. The common cold can worsen asthma and chronic obstructive pulmonary disease (COPD). Sometimes, these complications can require emergency medical care and may be life-threatening.  PREVENTION   The best way to protect against getting a cold is to practice good hygiene. Avoid oral or hand contact with people with cold symptoms. Wash your hands often if contact occurs. There is no clear evidence that vitamin C, vitamin E, echinacea, or exercise reduces the chance of developing a cold. However,   it is always recommended to get plenty of rest and practice good nutrition.  TREATMENT   Treatment is directed at relieving symptoms. There is no cure. Antibiotics are not effective, because the infection is caused by a virus, not by bacteria. Treatment may include:  · Increased fluid intake. Sports drinks offer valuable electrolytes, sugars, and fluids.  · Breathing heated mist or steam (vaporizer or shower).  · Eating chicken soup or other clear broths, and maintaining good nutrition.  · Getting plenty of rest.  · Using gargles or lozenges for comfort.  · Controlling fevers with ibuprofen or acetaminophen as directed by your caregiver.  · Increasing usage of your inhaler if you have asthma.  Zinc gel and zinc lozenges, taken in the first 24 hours of the common cold, can shorten the duration and lessen the severity of symptoms. Pain medicines may help with fever, muscle aches, and throat pain. A variety of non-prescription medicines are available to treat congestion  and runny nose. Your caregiver can make recommendations and may suggest nasal or lung inhalers for other symptoms.   HOME CARE INSTRUCTIONS   · Only take over-the-counter or prescription medicines for pain, discomfort, or fever as directed by your caregiver.  · Use a warm mist humidifier or inhale steam from a shower to increase air moisture. This may keep secretions moist and make it easier to breathe.  · Drink enough water and fluids to keep your urine clear or pale yellow.  · Rest as needed.  · Return to work when your temperature has returned to normal or as your caregiver advises. You may need to stay home longer to avoid infecting others. You can also use a face mask and careful hand washing to prevent spread of the virus.  SEEK MEDICAL CARE IF:   · After the first few days, you feel you are getting worse rather than better.  · You need your caregiver's advice about medicines to control symptoms.  · You develop chills, worsening shortness of breath, or brown or red sputum. These may be signs of pneumonia.  · You develop yellow or brown nasal discharge or pain in the face, especially when you bend forward. These may be signs of sinusitis.  · You develop a fever, swollen neck glands, pain with swallowing, or white areas in the back of your throat. These may be signs of strep throat.  SEEK IMMEDIATE MEDICAL CARE IF:   · You have a fever.  · You develop severe or persistent headache, ear pain, sinus pain, or chest pain.  · You develop wheezing, a prolonged cough, cough up blood, or have a change in your usual mucus (if you have chronic lung disease).  · You develop sore muscles or a stiff neck.  Document Released: 08/17/2000 Document Revised: 05/16/2011 Document Reviewed: 06/25/2010  Exit[employer]® Patient Information ©2014 [employer].

## 2013-05-19 NOTE — ED Notes (Signed)
Discharge instructions and prescriptions given and reviewed with patient.  Patient verbalized understanding to complete antibiotic and take medications as directed.  Patient ambulatory with steady gait; discharged home in good condition.

## 2013-05-19 NOTE — ED Provider Notes (Signed)
CSN: [id number]     Arrival date & time 05/18/13  2348 History  This chart was scribed for Emily Rice, MD by Zettie Pho, ED Scribe. This patient was seen in room APA18/[facility]18 and the patient's care was started at 12:28 AM.    Chief Complaint  Patient presents with  . Fever   The history is provided by the patient. No language interpreter was used.   HPI Comments: Emily Cooke is a 63 y.o. female who presents to the Emergency Department complaining of a subjective fever (patient is afebrile at 98 in the ED) with associated chills, sore throat, right ear pain, dry cough, sneezing, and congestion that she states has been ongoing for the past week. Patient reports that the sore throat is exacerbated with swallowing. She states that she has not been evaluated for these complaints. She denies headache, rhinorrhea. She states that she has been exposed to sick contacts. Patient has allergies to amoxicillin, Bactrim, ciprofloxacin, Imitrex, morphine, tetracyclines, and Zoloft. Patient has a history of HTN, migraine, arthritis, GERD, panic attack/disorder, and anxiety.   Past Medical History  Diagnosis Date  . Hypertension   . Migraine   . Arthritis   . GERD (gastroesophageal reflux disease)   . Panic attack   . Anxiety   . Panic disorder    Past Surgical History  Procedure Laterality Date  . Breast surgery    . Abdominal hysterectomy      partial   Family History  Problem Relation Age of Onset  . Hypertension Mother   . Diabetes Father    History  Substance Use Topics  . Smoking status: Former Smoker -- 2.00 packs/day for 30 years    Types: Cigarettes    Quit date: 04/01/2002  . Smokeless tobacco: Not on file  . Alcohol Use: No   OB History   Grav Para Term Preterm Abortions TAB SAB Ect Mult Living   4 3 1 2 1  1   3      Review of Systems  Constitutional: Positive for fever (subjective), chills and fatigue.  HENT: Positive for congestion, ear pain, sinus pressure,  sneezing and sore throat. Negative for rhinorrhea.   Respiratory: Positive for cough. Negative for shortness of breath and wheezing.   Cardiovascular: Negative for chest pain.  Gastrointestinal: Negative for nausea, vomiting and abdominal pain.  Genitourinary: Negative for dysuria and flank pain.  Musculoskeletal: Negative for back pain, neck pain and neck stiffness.  Skin: Negative for rash and wound.  Neurological: Negative for dizziness, weakness, numbness and headaches.  All other systems reviewed and are negative.    Allergies  Amoxicillin; Bactrim; Ciprofloxacin; Imitrex; Morphine and related; Tetracyclines & related; and Zoloft  Home Medications   Current Outpatient Rx  Name  Route  Sig  Dispense  Refill  . cyclobenzaprine (FLEXERIL) 10 MG tablet   Oral   Take 10 mg by mouth 3 (three) times daily as needed for muscle spasms.         . hydrOXYzine (ATARAX/VISTARIL) 25 MG tablet   Oral   Take 25 mg by mouth 3 (three) times daily as needed for anxiety.         Marland Kitchen lisinopril (PRINIVIL,ZESTRIL) 5 MG tablet   Oral   Take 2.5 mg by mouth every morning.          . meclizine (ANTIVERT) 25 MG tablet   Oral   Take 25 mg by mouth 3 (three) times daily as needed for dizziness.         Marland Kitchen  promethazine (PHENERGAN) 12.5 MG tablet   Oral   Take 1 tablet (12.5 mg total) by mouth every 6 (six) hours as needed for nausea.   30 tablet   0   . propranolol (INDERAL) 10 MG tablet   Oral   Take 10 mg by mouth at bedtime.           . RABEprazole (ACIPHEX) 20 MG tablet   Oral   Take 20 mg by mouth every morning.          . Alum & Mag Hydroxide-Simeth (MAGIC MOUTHWASH W/LIDOCAINE) SOLN   Oral   Take 10 mLs by mouth 4 (four) times daily as needed (throat pain).   120 mL   0     Equal parts ingredients   . azithromycin (ZITHROMAX Z-PAK) 250 MG tablet   Oral   Take 1 tablet (250 mg total) by mouth daily. 500mg  PO day 1, then 250mg  PO days 205   6 tablet   0   .  azithromycin (ZITHROMAX) 250 MG tablet   Oral   Take 1 tablet (250 mg total) by mouth daily. Take first 2 tablets together, then 1 every day until finished.   6 tablet   0   . naproxen (NAPROSYN) 500 MG tablet   Oral   Take 1 tablet (500 mg total) by mouth 2 (two) times daily with a meal.   30 tablet   0    Triage Vitals: BP 137/74  Pulse 79  Temp(Src) 98 F (36.7 C) (Oral)  Resp 18  Ht 5' 8.5" (1.74 m)  Wt 225 lb (102.059 kg)  BMI 33.71 kg/m2  SpO2 96%  Physical Exam  Nursing note and vitals reviewed. Constitutional: She is oriented to person, place, and time. She appears well-developed and well-nourished. No distress.  HENT:  Head: Normocephalic and atraumatic.  Mouth/Throat: Oropharynx is clear and moist. No oropharyngeal exudate.  Uvula is midline. Mildly enlarged bilateral tonsils. Patient has nasal mucosal swelling bilaterally. Chest tenderness to percussion to her right maxillary sinus. Chest bilateral right greater than left TM bulging without any erythema.  Eyes: EOM are normal. Pupils are equal, round, and reactive to light.  Neck: Normal range of motion. Neck supple.  No meningismus  Cardiovascular: Normal rate and regular rhythm.  Exam reveals no gallop and no friction rub.   No murmur heard. Pulmonary/Chest: Effort normal and breath sounds normal. No respiratory distress. She has no wheezes. She has no rales. She exhibits no tenderness.  Abdominal: Soft. Bowel sounds are normal. She exhibits no distension and no mass. There is no tenderness. There is no rebound and no guarding.  Musculoskeletal: Normal range of motion. She exhibits no edema and no tenderness.  No calf swelling or tenderness  Lymphadenopathy:    She has no cervical adenopathy.  Neurological: She is alert and oriented to person, place, and time.  Moves all extremities without deficit. Sensation is grossly intact.  Skin: Skin is warm and dry. No rash noted. No erythema.  Psychiatric: She has a  normal mood and affect. Her behavior is normal.    ED Course  Procedures (including critical care time)  DIAGNOSTIC STUDIES: Oxygen Saturation is 96% on room air, normal by my interpretation.    COORDINATION OF CARE: 12:31 AM- Discussed that symptoms are likely due to sinusitis. Will order a chest x-ray to rule out pneumonia. Discussed treatment plan with patient at bedside and patient verbalized agreement.     Labs Review Labs Reviewed -  No data to display Imaging Review No results found.   EKG Interpretation None      MDM   Final diagnoses:  None    I personally performed the services described in this documentation, which was scribed in my presence. The recorded information has been reviewed and is accurate.  We'll treat for URI/sinusitis. Return precautions have been given and the patient is voiced understanding.    Emily Rice, MD 05/19/13 [phone]

## 2013-07-10 DIAGNOSIS — Z87891 Personal history of nicotine dependence: Secondary | ICD-10-CM | POA: Insufficient documentation

## 2013-07-10 DIAGNOSIS — G43909 Migraine, unspecified, not intractable, without status migrainosus: Secondary | ICD-10-CM | POA: Insufficient documentation

## 2013-07-10 DIAGNOSIS — Z880 Allergy status to penicillin: Secondary | ICD-10-CM | POA: Insufficient documentation

## 2013-07-10 DIAGNOSIS — K219 Gastro-esophageal reflux disease without esophagitis: Secondary | ICD-10-CM | POA: Insufficient documentation

## 2013-07-10 DIAGNOSIS — M129 Arthropathy, unspecified: Secondary | ICD-10-CM | POA: Insufficient documentation

## 2013-07-10 DIAGNOSIS — Z79899 Other long term (current) drug therapy: Secondary | ICD-10-CM | POA: Insufficient documentation

## 2013-07-10 DIAGNOSIS — Z792 Long term (current) use of antibiotics: Secondary | ICD-10-CM | POA: Insufficient documentation

## 2013-07-10 DIAGNOSIS — H9209 Otalgia, unspecified ear: Secondary | ICD-10-CM | POA: Insufficient documentation

## 2013-07-10 DIAGNOSIS — IMO0002 Reserved for concepts with insufficient information to code with codable children: Secondary | ICD-10-CM | POA: Insufficient documentation

## 2013-07-10 DIAGNOSIS — I10 Essential (primary) hypertension: Secondary | ICD-10-CM | POA: Insufficient documentation

## 2013-07-10 DIAGNOSIS — F411 Generalized anxiety disorder: Secondary | ICD-10-CM | POA: Insufficient documentation

## 2013-07-10 DIAGNOSIS — F410 Panic disorder [episodic paroxysmal anxiety] without agoraphobia: Secondary | ICD-10-CM | POA: Insufficient documentation

## 2013-07-10 DIAGNOSIS — Z791 Long term (current) use of non-steroidal anti-inflammatories (NSAID): Secondary | ICD-10-CM | POA: Insufficient documentation

## 2013-07-11 ENCOUNTER — Emergency Department (HOSPITAL_COMMUNITY)
Admission: EM | Admit: 2013-07-11 | Discharge: 2013-07-11 | Disposition: A | Discharge status: Home / Self Care | Payer: Medicaid Other | Attending: Emergency Medicine | Admitting: Emergency Medicine

## 2013-07-11 ENCOUNTER — Encounter (HOSPITAL_COMMUNITY): Payer: Self-pay | Admitting: Emergency Medicine

## 2013-07-11 DIAGNOSIS — H9203 Otalgia, bilateral: Secondary | ICD-10-CM

## 2013-07-11 MED ORDER — OXYCODONE-ACETAMINOPHEN 5-325 MG PO TABS: 1.0000 | ORAL_TABLET | ORAL | Status: DC | PRN

## 2013-07-11 MED ORDER — OXYCODONE-ACETAMINOPHEN 5-325 MG PO TABS
1.0000 | ORAL_TABLET | Freq: Once | ORAL | Status: AC
  Administered 2013-07-11: 1 via ORAL
  Filled 2013-07-11: qty 1

## 2013-07-11 MED ORDER — NEOMYCIN-POLYMYXIN-HC 3.5-10000-1 OT SUSP: 3.0000 [drp] | Freq: Four times a day (QID) | OTIC | Status: DC

## 2013-07-11 NOTE — Discharge Instructions (Signed)
Otalgia °The most common reason for this in children is an infection of the middle ear. Pain from the middle ear is usually caused by a build-up of fluid and pressure behind the eardrum. Pain from an earache can be sharp, dull, or burning. The pain may be temporary or constant. The middle ear is connected to the nasal passages by a short narrow tube called the Eustachian tube. The Eustachian tube allows fluid to drain out of the middle ear, and helps keep the pressure in your ear equalized. °CAUSES  °A cold or allergy can block the Eustachian tube with inflammation and the build-up of secretions. This is especially likely in small children, because their Eustachian tube is shorter and more horizontal. When the Eustachian tube closes, the normal flow of fluid from the middle ear is stopped. Fluid can accumulate and cause stuffiness, pain, hearing loss, and an ear infection if germs start growing in this area. °SYMPTOMS  °The symptoms of an ear infection may include fever, ear pain, fussiness, increased crying, and irritability. Many children will have temporary and minor hearing loss during and right after an ear infection. Permanent hearing loss is rare, but the risk increases the more infections a child has. Other causes of ear pain include retained water in the outer ear canal from swimming and bathing. °Ear pain in adults is less likely to be from an ear infection. Ear pain may be referred from other locations. Referred pain may be from the joint between your jaw and the skull. It may also come from a tooth problem or problems in the neck. Other causes of ear pain include: °· A foreign body in the ear. °· Outer ear infection. °· Sinus infections. °· Impacted ear wax. °· Ear injury. °· Arthritis of the jaw or TMJ problems. °· Middle ear infection. °· Tooth infections. °· Sore throat with pain to the ears. °DIAGNOSIS  °Your caregiver can usually make the diagnosis by examining you. Sometimes other special studies,  including x-rays and lab work may be necessary. °TREATMENT  °· If antibiotics were prescribed, use them as directed and finish them even if you or your child's symptoms seem to be improved. °· Sometimes PE tubes are needed in children. These are little plastic tubes which are put into the eardrum during a simple surgical procedure. They allow fluid to drain easier and allow the pressure in the middle ear to equalize. This helps relieve the ear pain caused by pressure changes. °HOME CARE INSTRUCTIONS  °· Only take over-the-counter or prescription medicines for pain, discomfort, or fever as directed by your caregiver. DO NOT GIVE CHILDREN ASPIRIN because of the association of Reye's Syndrome in children taking aspirin. °· Use a cold pack applied to the outer ear for 15-20 minutes, 03-04 times per day or as needed may reduce pain. Do not apply ice directly to the skin. You may cause frost bite. °· Over-the-counter ear drops used as directed may be effective. Your caregiver may sometimes prescribe ear drops. °· Resting in an upright position may help reduce pressure in the middle ear and relieve pain. °· Ear pain caused by rapidly descending from high altitudes can be relieved by swallowing or chewing gum. Allowing infants to suck on a bottle during airplane travel can help. °· Do not smoke in the house or near children. If you are unable to quit smoking, smoke outside. °· Control allergies. °SEEK IMMEDIATE MEDICAL CARE IF:  °· You or your child are becoming sicker. °· Pain or fever   relief is not obtained with medicine.  You or your child's symptoms (pain, fever, or irritability) do not improve within 24 to 48 hours or as instructed.  Severe pain suddenly stops hurting. This may indicate a ruptured eardrum.  You or your children develop new problems such as severe headaches, stiff neck, difficulty swallowing, or swelling of the face or around the ear. Document Released: 10/09/2003 Document Revised: 05/16/2011  Document Reviewed: 02/13/2008 [facility] Patient Information 2014 [place].   Ear Drops, Adult You have been diagnosed with a condition requiring you to put drops of medication into your outer ear. HOME CARE INSTRUCTIONS   Put drops in the affected ear as instructed. After putting the drops in, you will need to lay down with the affected ear facing up for ten minutes so the drops will remain in the ear canal and run down and fill the canal. Continue using eardrops for as long as directed by your health care provider.  Prior to getting up, put a cotton ball gently in your ear canal. Leave enough of the ball out so it can be easily removed. Do not attempt to push this down into the canal with a cotton-tipped swab or other instrument.  Do not irrigate or wash out your ears if you have had a perforated eardrum or mastoid surgery, or unless instructed to do so by your health care provider.  Keep appointments with your health care provider as instructed.  Finish all medications, or use for the length of time as instructed. Continue the drops even if your problem seems to be doing well after a couple days, or continue as instructed. SEEK MEDICAL CARE IF:  You become worse or develop increasing pain.  You notice any unusual drainage from your ear (particularly if the drainage stinks).  You develop hearing difficulties.  You experience a serious form of dizziness in which you feel as if the room is spinning, and you feel nauseated (vertigo).  The outside of your ear becomes red or swollen or both. This may be a sign of an allergic reaction. MAKE SURE YOU:   Understand these instructions.  Will watch your condition.  Will get help right away if you are not doing well or get worse. Document Released: 02/15/2001 Document Revised: 12/12/2012 Document Reviewed: 09/18/2012 [facility] Patient Information 2014 [place], [place].  Acetaminophen; Oxycodone tablets What is this  medicine? ACETAMINOPHEN; OXYCODONE (a set a MEE noe fen; ox i KOE done) is a pain reliever. It is used to treat mild to moderate pain. This medicine may be used for other purposes; ask your health care provider or pharmacist if you have questions. COMMON BRAND NAME(S): Endocet, Magnacet, Narvox, Percocet, Perloxx, Primalev, Primlev, Roxicet, Xolox What should I tell my health care provider before I take this medicine? They need to know if you have any of these conditions: -brain tumor -Crohn's disease, inflammatory bowel disease, or ulcerative colitis -drug abuse or addiction -head injury -heart or circulation problems -if you often drink alcohol -kidney disease or problems going to the bathroom -liver disease -lung disease, asthma, or breathing problems -an unusual or allergic reaction to acetaminophen, oxycodone, other opioid analgesics, other medicines, foods, dyes, or preservatives -pregnant or trying to get pregnant -breast-feeding How should I use this medicine? Take this medicine by mouth with a full glass of water. Follow the directions on the prescription label. Take your medicine at regular intervals. Do not take your medicine more often than directed. Talk to your pediatrician regarding the use of  this medicine in children. Special care may be needed. Patients over 32 years old may have a stronger reaction and need a smaller dose. Overdosage: If you think you have taken too much of this medicine contact a poison control center or emergency room at once. NOTE: This medicine is only for you. Do not share this medicine with others. What if I miss a dose? If you miss a dose, take it as soon as you can. If it is almost time for your next dose, take only that dose. Do not take double or extra doses. What may interact with this medicine? -alcohol -antihistamines -barbiturates like amobarbital, butalbital, butabarbital, methohexital, pentobarbital, phenobarbital, thiopental, and  secobarbital -benztropine -drugs for bladder problems like solifenacin, trospium, oxybutynin, tolterodine, hyoscyamine, and methscopolamine -drugs for breathing problems like ipratropium and tiotropium -drugs for certain stomach or intestine problems like propantheline, homatropine methylbromide, glycopyrrolate, atropine, belladonna, and dicyclomine -general anesthetics like etomidate, ketamine, nitrous oxide, propofol, desflurane, enflurane, halothane, isoflurane, and sevoflurane -medicines for depression, anxiety, or psychotic disturbances -medicines for sleep -muscle relaxants -naltrexone -narcotic medicines (opiates) for pain -phenothiazines like perphenazine, thioridazine, chlorpromazine, mesoridazine, fluphenazine, prochlorperazine, promazine, and trifluoperazine -scopolamine -tramadol -trihexyphenidyl This list may not describe all possible interactions. Give your health care provider a list of all the medicines, herbs, non-prescription drugs, or dietary supplements you use. Also tell them if you smoke, drink alcohol, or use illegal drugs. Some items may interact with your medicine. What should I watch for while using this medicine? Tell your doctor or health care professional if your pain does not go away, if it gets worse, or if you have new or a different type of pain. You may develop tolerance to the medicine. Tolerance means that you will need a higher dose of the medication for pain relief. Tolerance is normal and is expected if you take this medicine for a long time. Do not suddenly stop taking your medicine because you may develop a severe reaction. Your body becomes used to the medicine. This does NOT mean you are addicted. Addiction is a behavior related to getting and using a drug for a non-medical reason. If you have pain, you have a medical reason to take pain medicine. Your doctor will tell you how much medicine to take. If your doctor wants you to stop the medicine, the dose  will be slowly lowered over time to avoid any side effects. You may get drowsy or dizzy. Do not drive, use machinery, or do anything that needs mental alertness until you know how this medicine affects you. Do not stand or sit up quickly, especially if you are an older patient. This reduces the risk of dizzy or fainting spells. Alcohol may interfere with the effect of this medicine. Avoid alcoholic drinks. There are different types of narcotic medicines (opiates) for pain. If you take more than one type at the same time, you may have more side effects. Give your health care provider a list of all medicines you use. Your doctor will tell you how much medicine to take. Do not take more medicine than directed. Call emergency for help if you have problems breathing. The medicine will cause constipation. Try to have a bowel movement at least every 2 to 3 days. If you do not have a bowel movement for 3 days, call your doctor or health care professional. Do not take Tylenol (acetaminophen) or medicines that have acetaminophen with this medicine. Too much acetaminophen can be very dangerous. Many nonprescription medicines contain acetaminophen. Always read  the labels carefully to avoid taking more acetaminophen. What side effects may I notice from receiving this medicine? Side effects that you should report to your doctor or health care professional as soon as possible: -allergic reactions like skin rash, itching or hives, swelling of the face, lips, or tongue -breathing difficulties, wheezing -confusion -light headedness or fainting spells -severe stomach pain -unusually weak or tired -yellowing of the skin or the whites of the eyes  Side effects that usually do not require medical attention (report to your doctor or health care professional if they continue or are bothersome): -dizziness -drowsiness -nausea -vomiting This list may not describe all possible side effects. Call your doctor for medical  advice about side effects. You may report side effects to FDA at 1-800-FDA-1088. Where should I keep my medicine? Keep out of the reach of children. This medicine can be abused. Keep your medicine in a safe place to protect it from theft. Do not share this medicine with anyone. Selling or giving away this medicine is dangerous and against the law. Store at room temperature between 20 and 25 degrees C (68 and 77 degrees F). Keep container tightly closed. Protect from light. This medicine may cause accidental overdose and death if it is taken by other adults, children, or pets. Flush any unused medicine down the toilet to reduce the chance of harm. Do not use the medicine after the expiration date. NOTE: This sheet is a summary. It may not cover all possible information. If you have questions about this medicine, talk to your doctor, pharmacist, or health care provider.  2014, [employer]. (2012-10-15 13:17:35)

## 2013-07-11 NOTE — ED Notes (Signed)
Bilat ear pain for several days, states she is taking tylenol for same without relief

## 2013-07-11 NOTE — ED Provider Notes (Signed)
CSN: [id number]     Arrival date & time 07/10/13  2346 History   First MD Initiated Contact with Patient 07/11/13 0128     Chief complaint: Ear pain  (Consider location/radiation/quality/duration/timing/severity/associated sxs/prior Treatment) The history is provided by the patient.   63 year old female has had pain in her right ear for the last week. Pain is still and throbbing and is getting worse. She currently rates pain at 9/10. Nothing makes it better nothing makes it worse. She states that hearing is decreased out of that ear but there's been no drainage. She denies fever, chills, sweats. There's been no rhinorrhea or sore throat or cough. She is taking acetaminophen with no relief. She currently rates pain at 9/10. Today, the pain has started to spread into her left ear.  Past Medical History  Diagnosis Date  . Hypertension   . Migraine   . Arthritis   . GERD (gastroesophageal reflux disease)   . Panic attack   . Anxiety   . Panic disorder    Past Surgical History  Procedure Laterality Date  . Breast surgery    . Abdominal hysterectomy      partial   Family History  Problem Relation Age of Onset  . Hypertension Mother   . Diabetes Father    History  Substance Use Topics  . Smoking status: Former Smoker -- 2.00 packs/day for 30 years    Types: Cigarettes    Quit date: 04/01/2002  . Smokeless tobacco: Not on file  . Alcohol Use: No   OB History   Grav Para Term Preterm Abortions TAB SAB Ect Mult Living   4 3 1 2 1  1   3      Review of Systems  All other systems reviewed and are negative.     Allergies  Ibuprofen; Amoxicillin; Bactrim; Ciprofloxacin; Imitrex; Morphine and related; Tetracyclines & related; and Zoloft  Home Medications   Prior to Admission medications   Medication Sig Start Date End Date Taking? Authorizing Provider  cyclobenzaprine (FLEXERIL) 10 MG tablet Take 10 mg by mouth 3 (three) times daily as needed for muscle spasms.   Yes Historical  Provider, MD  hydrOXYzine (ATARAX/VISTARIL) 25 MG tablet Take 25 mg by mouth 3 (three) times daily as needed for anxiety.   Yes Historical Provider, MD  lisinopril (PRINIVIL,ZESTRIL) 5 MG tablet Take 2.5 mg by mouth every morning.    Yes Historical Provider, MD  meclizine (ANTIVERT) 25 MG tablet Take 25 mg by mouth 3 (three) times daily as needed for dizziness.   Yes Historical Provider, MD  propranolol (INDERAL) 10 MG tablet Take 10 mg by mouth at bedtime.     Yes Historical Provider, MD  RABEprazole (ACIPHEX) 20 MG tablet Take 20 mg by mouth every morning.    Yes Historical Provider, MD  Alum & Mag Hydroxide-Simeth (MAGIC MOUTHWASH W/LIDOCAINE) SOLN Take 10 mLs by mouth 4 (four) times daily as needed (throat pain). 12/22/12   Evalee Jefferson, PA-C  azithromycin (ZITHROMAX Z-PAK) 250 MG tablet Take 1 tablet (250 mg total) by mouth daily. 500mg  PO day 1, then 250mg  PO days 205 02/11/13   Johnna Acosta, MD  azithromycin (ZITHROMAX Z-PAK) 250 MG tablet 2 po day one, then 1 daily x 4 days 05/19/13   [name], MD  azithromycin (ZITHROMAX) 250 MG tablet Take 1 tablet (250 mg total) by mouth daily. Take first 2 tablets together, then 1 every day until finished. 12/22/12   Evalee Jefferson, PA-C  guaiFENesin (ROBITUSSIN)  100 MG/5ML liquid Take 5-10 mLs (100-200 mg total) by mouth every 4 (four) hours as needed for cough. 05/19/13   [name], MD  mometasone (NASONEX) 50 MCG/ACT nasal spray Place 2 sprays into the nose daily. 05/19/13   [name], MD  naproxen (NAPROSYN) 500 MG tablet Take 1 tablet (500 mg total) by mouth 2 (two) times daily with a meal. 02/11/13   Johnna Acosta, MD  oxymetazoline (AFRIN NASAL SPRAY) 0.05 % nasal spray Place 1 spray into both nostrils 2 (two) times daily. 05/19/13   [name], MD  promethazine (PHENERGAN) 12.5 MG tablet Take 1 tablet (12.5 mg total) by mouth every 6 (six) hours as needed for nausea. 9/32/67   Delora Fuel, MD   BP 124/58  Pulse 63  Temp(Src) 98 F  (36.7 C) (Oral)  Resp 18  Ht 5\' 8"  (1.727 m)  Wt 220 lb (99.791 kg)  BMI 33.46 kg/m2  SpO2 99% Physical Exam  Nursing note and vitals reviewed.  63 year old female, resting comfortably and in no acute distress. Vital signs are normal. Oxygen saturation is 99%, which is normal. Head is normocephalic and atraumatic. PERRLA, EOMI. Oropharynx is clear. TMs are clear. Pain is elicited when tension is applied to the helix of the right ear. Neck is nontender and supple without JVD. There is very mild right posterior cervical lymphadenopathy. Back is nontender and there is no CVA tenderness. Lungs are clear without rales, wheezes, or rhonchi. Chest is nontender. Heart has regular rate and rhythm without murmur. Abdomen is soft, flat, nontender without masses or hepatosplenomegaly and peristalsis is normoactive. Extremities have no cyanosis or edema, full range of motion is present. Skin is warm and dry without rash. Neurologic: Mental status is normal, cranial nerves are intact, there are no motor or sensory deficits.  ED Course  Procedures (including critical care time)  MDM   Final diagnoses:  Otalgia of both ears    Ear pain of uncertain cause. There is no evidence of otitis media. Pain with tension on the helix is suggestive of otitis externa but I do not see any other physical findings suggestive of that. Of note, she is allergic to all of the common oral antibiotics except for erythromycin derivatives. She is discharged with a prescription for Cortisporin Otic suspension and a prescription for oxycodone and acetaminophen for pain.    Delora Fuel, MD 09/98/33 8250

## 2013-08-06 ENCOUNTER — Encounter (HOSPITAL_COMMUNITY): Payer: Self-pay | Admitting: Emergency Medicine

## 2013-08-06 ENCOUNTER — Emergency Department (HOSPITAL_COMMUNITY)
Admission: EM | Admit: 2013-08-06 | Discharge: 2013-08-06 | Disposition: A | Discharge status: Home / Self Care | Payer: Medicaid Other | Attending: Emergency Medicine | Admitting: Emergency Medicine

## 2013-08-06 DIAGNOSIS — Z8659 Personal history of other mental and behavioral disorders: Secondary | ICD-10-CM | POA: Insufficient documentation

## 2013-08-06 DIAGNOSIS — Z79899 Other long term (current) drug therapy: Secondary | ICD-10-CM | POA: Insufficient documentation

## 2013-08-06 DIAGNOSIS — Z880 Allergy status to penicillin: Secondary | ICD-10-CM | POA: Insufficient documentation

## 2013-08-06 DIAGNOSIS — G43909 Migraine, unspecified, not intractable, without status migrainosus: Secondary | ICD-10-CM | POA: Insufficient documentation

## 2013-08-06 DIAGNOSIS — Z87891 Personal history of nicotine dependence: Secondary | ICD-10-CM | POA: Insufficient documentation

## 2013-08-06 DIAGNOSIS — F411 Generalized anxiety disorder: Secondary | ICD-10-CM | POA: Insufficient documentation

## 2013-08-06 DIAGNOSIS — Z792 Long term (current) use of antibiotics: Secondary | ICD-10-CM | POA: Insufficient documentation

## 2013-08-06 DIAGNOSIS — N390 Urinary tract infection, site not specified: Secondary | ICD-10-CM | POA: Insufficient documentation

## 2013-08-06 DIAGNOSIS — I10 Essential (primary) hypertension: Secondary | ICD-10-CM | POA: Insufficient documentation

## 2013-08-06 DIAGNOSIS — Z90710 Acquired absence of both cervix and uterus: Secondary | ICD-10-CM | POA: Insufficient documentation

## 2013-08-06 DIAGNOSIS — K219 Gastro-esophageal reflux disease without esophagitis: Secondary | ICD-10-CM | POA: Insufficient documentation

## 2013-08-06 DIAGNOSIS — Z8739 Personal history of other diseases of the musculoskeletal system and connective tissue: Secondary | ICD-10-CM | POA: Insufficient documentation

## 2013-08-06 LAB — URINALYSIS, ROUTINE W REFLEX MICROSCOPIC
Bilirubin Urine: NEGATIVE
Glucose, UA: NEGATIVE mg/dL
Ketones, ur: NEGATIVE mg/dL
Nitrite: NEGATIVE
PH: 5.5 (ref 5.0–8.0)
Protein, ur: NEGATIVE mg/dL
Urobilinogen, UA: 1 mg/dL (ref 0.0–1.0)

## 2013-08-06 LAB — URINE MICROSCOPIC-ADD ON

## 2013-08-06 MED ORDER — CLINDAMYCIN HCL 300 MG PO CAPS: 300.0000 mg | ORAL_CAPSULE | Freq: Four times a day (QID) | ORAL | Status: DC

## 2013-08-06 NOTE — ED Notes (Signed)
Hematuria?, seeing blood when wipes.  Being tx for uti , but only taking 1 pill daily instead of twice a day

## 2013-08-06 NOTE — ED Notes (Signed)
Pt c/o hematuria and burning with urination.

## 2013-08-06 NOTE — ED Provider Notes (Signed)
CSN: [id number]     Arrival date & time 08/06/13  1733 History  HPI Comments: This chart was scribed for [name], MD by Martinique Peace, ED Scribe. The patient was seen in [place]. The patient's care was started at 6:00 PM.   Chief Complaint  Patient presents with  . Hematuria    (Consider location/radiation/quality/duration/timing/severity/associated sxs/prior Treatment) The history is provided by the patient. No language interpreter was used.   HPI Comments: Emily Cooke is a 63 y.o. female who presents to the Emergency Department complaining of minimal hematuria that she has noticed over the past couple days when wiping. She also reports associated dysuria and back pain. She has been previously prescribed medication for history of UTI. She states she was instructed to take 2 pills daily but has only been taking 1. She also states that she has an associated yeast infection. She further states history of partial hysterectomy.  She denies fever or any other associated symptoms.  Past Medical History  Diagnosis Date  . Hypertension   . Migraine   . Arthritis   . GERD (gastroesophageal reflux disease)   . Panic attack   . Anxiety   . Panic disorder    Past Surgical History  Procedure Laterality Date  . Breast surgery    . Abdominal hysterectomy      partial   Family History  Problem Relation Age of Onset  . Hypertension Mother   . Diabetes Father    History  Substance Use Topics  . Smoking status: Former Smoker -- 2.00 packs/day for 30 years    Types: Cigarettes    Quit date: 04/01/2002  . Smokeless tobacco: Not on file  . Alcohol Use: No   OB History   Grav Para Term Preterm Abortions TAB SAB Ect Mult Living   4 3 1 2 1  1   3      Review of Systems A complete 10 system review of systems was obtained and all systems are negative except as noted in the HPI and PMH.   Allergies  Cephalexin; Ibuprofen; Amoxicillin; Bactrim; Ciprofloxacin; Imitrex; Morphine  and related; Tetracyclines & related; and Zoloft  Home Medications   Prior to Admission medications   Medication Sig Start Date End Date Taking? Authorizing Provider  cyclobenzaprine (FLEXERIL) 10 MG tablet Take 10 mg by mouth daily as needed for muscle spasms.   Yes Historical Provider, MD  hydrOXYzine (ATARAX/VISTARIL) 25 MG tablet Take 25 mg by mouth daily as needed for anxiety.   Yes Historical Provider, MD  lisinopril (PRINIVIL,ZESTRIL) 5 MG tablet Take 2.5 mg by mouth every morning.    Yes Historical Provider, MD  nitrofurantoin, macrocrystal-monohydrate, (MACROBID) 100 MG capsule Take 100 mg by mouth daily. 7 day course starting on 07/25/2013   Yes Historical Provider, MD  promethazine (PHENERGAN) 12.5 MG tablet Take 1 tablet (12.5 mg total) by mouth every 6 (six) hours as needed for nausea. 10/28/21  Yes Delora Fuel, MD  propranolol (INDERAL) 10 MG tablet Take 10 mg by mouth at bedtime.     Yes Historical Provider, MD  RABEprazole (ACIPHEX) 20 MG tablet Take 20 mg by mouth every morning.    Yes Historical Provider, MD   Triage Vitals: BP 166/79  Pulse 85  Temp(Src) 98.9 F (37.2 C) (Oral)  Resp 18  SpO2 100% Physical Exam  Nursing note and vitals reviewed. Constitutional: She is oriented to person, place, and time. She appears well-developed and well-nourished. No distress.  HENT:  Head: Normocephalic  and atraumatic.  Eyes: Conjunctivae and EOM are normal.  Neck: Normal range of motion. Neck supple. No tracheal deviation present.  Cardiovascular: Normal rate and intact distal pulses.   Pulmonary/Chest: Effort normal and breath sounds normal. No respiratory distress.  Abdominal: Soft. Bowel sounds are normal.  Musculoskeletal: Normal range of motion. She exhibits no edema.  Neurological: She is alert and oriented to person, place, and time. She has normal reflexes.  Skin: Skin is warm and dry.  Psychiatric: She has a normal mood and affect. Her behavior is normal.    ED  Course  Procedures (including critical care time) DIAGNOSTIC STUDIES: Oxygen Saturation is 100% on room air, normal by my interpretation.    COORDINATION OF CARE: 6:06 PM- Treatment plan was discussed with patient who verbalizes understanding and agrees.   Labs Review Labs Reviewed  URINALYSIS, ROUTINE W REFLEX MICROSCOPIC    Imaging Review No results found.   EKG Interpretation None      MDM   Final diagnoses:  None    Patient presents with complaints of UTI symptoms and blood when wiping. She has a history of hysterectomy and is quite concerned about the blood. Speculum exam reveals no concerning lesions and no blood. She does have what appears to be a candidal infection in her groin and she will continue her triamcinolone and nystatin cream. She has multiple allergies to antibiotics and it does not appears though the Macrobid she has been taking is helping. I will change this to clindamycin as this is just about the only antibiotic she does not have an allergy to. She understands to worsen if her symptoms substantially worsen or change.  I personally performed the services described in this documentation, which was scribed in my presence. The recorded information has been reviewed and is accurate.      [name], MD 08/06/13 [phone]

## 2013-08-06 NOTE — Discharge Instructions (Signed)
Clindamycin as prescribed.  Return to the emergency department if your symptoms substantially worsen or change.   Urinary Tract Infection Urinary tract infections (UTIs) can develop anywhere along your urinary tract. Your urinary tract is your body's drainage system for removing wastes and extra water. Your urinary tract includes two kidneys, two ureters, a bladder, and a urethra. Your kidneys are a pair of bean-shaped organs. Each kidney is about the size of your fist. They are located below your ribs, one on each side of your spine. CAUSES Infections are caused by microbes, which are microscopic organisms, including fungi, viruses, and bacteria. These organisms are so small that they can only be seen through a microscope. Bacteria are the microbes that most commonly cause UTIs. SYMPTOMS  Symptoms of UTIs may vary by age and gender of the patient and by the location of the infection. Symptoms in young women typically include a frequent and intense urge to urinate and a painful, burning feeling in the bladder or urethra during urination. Older women and men are more likely to be tired, shaky, and weak and have muscle aches and abdominal pain. A fever may mean the infection is in your kidneys. Other symptoms of a kidney infection include pain in your back or sides below the ribs, nausea, and vomiting. DIAGNOSIS To diagnose a UTI, your caregiver will ask you about your symptoms. Your caregiver also will ask to provide a urine sample. The urine sample will be tested for bacteria and white blood cells. White blood cells are made by your body to help fight infection. TREATMENT  Typically, UTIs can be treated with medication. Because most UTIs are caused by a bacterial infection, they usually can be treated with the use of antibiotics. The choice of antibiotic and length of treatment depend on your symptoms and the type of bacteria causing your infection. HOME CARE INSTRUCTIONS  If you were prescribed  antibiotics, take them exactly as your caregiver instructs you. Finish the medication even if you feel better after you have only taken some of the medication.  Drink enough water and fluids to keep your urine clear or pale yellow.  Avoid caffeine, tea, and carbonated beverages. They tend to irritate your bladder.  Empty your bladder often. Avoid holding urine for long periods of time.  Empty your bladder before and after sexual intercourse.  After a bowel movement, women should cleanse from front to back. Use each tissue only once. SEEK MEDICAL CARE IF:   You have back pain.  You develop a fever.  Your symptoms do not begin to resolve within 3 days. SEEK IMMEDIATE MEDICAL CARE IF:   You have severe back pain or lower abdominal pain.  You develop chills.  You have nausea or vomiting.  You have continued burning or discomfort with urination. MAKE SURE YOU:   Understand these instructions.  Will watch your condition.  Will get help right away if you are not doing well or get worse. Document Released: 12/01/2004 Document Revised: 08/23/2011 Document Reviewed: 04/01/2011 [facility] Patient Information 2014 [place].

## 2013-09-14 ENCOUNTER — Emergency Department (HOSPITAL_COMMUNITY)
Admission: EM | Admit: 2013-09-14 | Discharge: 2013-09-14 | Disposition: A | Discharge status: Home / Self Care | Payer: Medicaid Other | Attending: Emergency Medicine | Admitting: Emergency Medicine

## 2013-09-14 ENCOUNTER — Encounter (HOSPITAL_COMMUNITY): Payer: Self-pay | Admitting: Emergency Medicine

## 2013-09-14 DIAGNOSIS — Z87891 Personal history of nicotine dependence: Secondary | ICD-10-CM | POA: Insufficient documentation

## 2013-09-14 DIAGNOSIS — R350 Frequency of micturition: Secondary | ICD-10-CM | POA: Insufficient documentation

## 2013-09-14 DIAGNOSIS — Z8739 Personal history of other diseases of the musculoskeletal system and connective tissue: Secondary | ICD-10-CM | POA: Insufficient documentation

## 2013-09-14 DIAGNOSIS — F411 Generalized anxiety disorder: Secondary | ICD-10-CM | POA: Insufficient documentation

## 2013-09-14 DIAGNOSIS — Z87440 Personal history of urinary (tract) infections: Secondary | ICD-10-CM | POA: Diagnosis not present

## 2013-09-14 DIAGNOSIS — K219 Gastro-esophageal reflux disease without esophagitis: Secondary | ICD-10-CM | POA: Insufficient documentation

## 2013-09-14 DIAGNOSIS — Z792 Long term (current) use of antibiotics: Secondary | ICD-10-CM | POA: Insufficient documentation

## 2013-09-14 DIAGNOSIS — I10 Essential (primary) hypertension: Secondary | ICD-10-CM | POA: Diagnosis not present

## 2013-09-14 DIAGNOSIS — R509 Fever, unspecified: Secondary | ICD-10-CM | POA: Diagnosis not present

## 2013-09-14 DIAGNOSIS — Z880 Allergy status to penicillin: Secondary | ICD-10-CM | POA: Insufficient documentation

## 2013-09-14 HISTORY — DX: Urinary tract infection, site not specified: N39.0

## 2013-09-14 LAB — URINALYSIS, ROUTINE W REFLEX MICROSCOPIC
BILIRUBIN URINE: NEGATIVE
Glucose, UA: NEGATIVE mg/dL
HGB URINE DIPSTICK: NEGATIVE
KETONES UR: NEGATIVE mg/dL
NITRITE: NEGATIVE
PROTEIN: NEGATIVE mg/dL
Specific Gravity, Urine: 1.015 (ref 1.005–1.030)
UROBILINOGEN UA: 0.2 mg/dL (ref 0.0–1.0)
pH: 5.5 (ref 5.0–8.0)

## 2013-09-14 LAB — URINE MICROSCOPIC-ADD ON

## 2013-09-14 NOTE — ED Provider Notes (Signed)
CSN: [id number]     Arrival date & time 09/14/13  0008 History   First MD Initiated Contact with Patient 09/14/13 0031    This chart was scribed for Emily Cable, MD by Terressa Koyanagi, ED Scribe. This patient was seen in room [facility]03[facility] and the patient's care was started at 12:39 AM.  Chief Complaint  Patient presents with  . Urinary Tract Infection   The history is provided by the patient. No language interpreter was used.   HPI Comments: KITZIA CAMUS is a 63 y.o. female , with an extensive medical Hx noted below, who presents to the Emergency Department complaining of intermittent fever, chills and urinary frequency. Pt denies abd pain, back pain, nausea, vomiting, diarrhea, chest pain, cough, blood in urine or dysuria. She has no other complaints Past Medical History  Diagnosis Date  . Hypertension   . Migraine   . Arthritis   . GERD (gastroesophageal reflux disease)   . Panic attack   . Anxiety   . Panic disorder   . UTI (lower urinary tract infection)    Past Surgical History  Procedure Laterality Date  . Breast surgery    . Abdominal hysterectomy      partial   Family History  Problem Relation Age of Onset  . Hypertension Mother   . Diabetes Father    History  Substance Use Topics  . Smoking status: Former Smoker -- 2.00 packs/day for 30 years    Types: Cigarettes    Quit date: 04/01/2002  . Smokeless tobacco: Not on file  . Alcohol Use: No   OB History   Grav Para Term Preterm Abortions TAB SAB Ect Mult Living   4 3 1 2 1  1   3      Review of Systems  Constitutional: Positive for fever and chills.  Respiratory: Negative for cough.   Cardiovascular: Negative for chest pain.  Gastrointestinal: Negative for nausea, vomiting, abdominal pain and diarrhea.  Genitourinary: Positive for frequency. Negative for dysuria.  All other systems reviewed and are negative.     Allergies  Cephalexin; Ibuprofen; Amoxicillin; Bactrim; Ciprofloxacin; Imitrex;  Morphine and related; Tetracyclines & related; and Zoloft  Home Medications   Prior to Admission medications   Medication Sig Start Date End Date Taking? Authorizing Provider  clindamycin (CLEOCIN) 300 MG capsule Take 1 capsule (300 mg total) by mouth 4 (four) times daily. X 7 days 08/06/13   [name], MD  cyclobenzaprine (FLEXERIL) 10 MG tablet Take 10 mg by mouth daily as needed for muscle spasms.    Historical Provider, MD  hydrOXYzine (ATARAX/VISTARIL) 25 MG tablet Take 25 mg by mouth daily as needed for anxiety.    Historical Provider, MD  lisinopril (PRINIVIL,ZESTRIL) 5 MG tablet Take 2.5 mg by mouth every morning.     Historical Provider, MD  nitrofurantoin, macrocrystal-monohydrate, (MACROBID) 100 MG capsule Take 100 mg by mouth daily. 7 day course starting on 07/25/2013    Historical Provider, MD  nystatin ointment (MYCOSTATIN) Place 1 application vaginally 2 (two) times daily.    Historical Provider, MD  promethazine (PHENERGAN) 12.5 MG tablet Take 1 tablet (12.5 mg total) by mouth every 6 (six) hours as needed for nausea. 8/54/62   Delora Fuel, MD  propranolol (INDERAL) 10 MG tablet Take 10 mg by mouth at bedtime.      Historical Provider, MD  RABEprazole (ACIPHEX) 20 MG tablet Take 20 mg by mouth every morning.     Historical Provider, MD  triamcinolone  cream (KENALOG) 0.1 % Place 1 application vaginally 2 (two) times daily.    Historical Provider, MD   Triage Vitals: BP 135/73  Pulse 71  Temp(Src) 98 F (36.7 C) (Oral)  Resp 16  Ht 5\' 8"  (1.727 m)  Wt 225 lb (102.059 kg)  BMI 34.22 kg/m2  SpO2 98% Physical Exam CONSTITUTIONAL: Well developed/well nourished HEAD: Normocephalic/atraumatic EYES: EOMI/PERRL ENMT: Mucous membranes moist NECK: supple no meningeal signs SPINE:entire spine nontender CV: S1/S2 noted, no murmurs/rubs/gallops noted LUNGS: Lungs are clear to auscultation bilaterally, no apparent distress ABDOMEN: soft, nontender, no rebound or guarding GU:no cva  tenderness NEURO: Pt is awake/alert, moves all extremitiesx4 EXTREMITIES: pulses normal, full ROM SKIN: warm, color normal PSYCH: no abnormalities of mood noted  ED Course  Procedures  DIAGNOSTIC STUDIES: Oxygen Saturation is 98% on RA, normal by my interpretation.    COORDINATION OF CARE: 12:41 AM-Discussed treatment plan which includes UA with pt at bedside and pt agreed to plan.   Pt requesting d/c home She is well appearing, no distress   Labs Review Labs Reviewed  URINALYSIS, ROUTINE W REFLEX MICROSCOPIC - Abnormal; Notable for the following:    Leukocytes, UA SMALL (*)    All other components within normal limits  URINE MICROSCOPIC-ADD ON - Abnormal; Notable for the following:    Bacteria, UA FEW (*)    All other components within normal limits      MDM   Final diagnoses:  Urinary frequency    Nursing notes including past medical history and social history reviewed and considered in documentation Labs/vital reviewed and considered   I personally performed the services described in this documentation, which was scribed in my presence. The recorded information has been reviewed and is accurate.      Emily Cable, MD 09/14/13 [phone]

## 2013-09-14 NOTE — ED Notes (Signed)
Patient complaining of urinary frequency, chills, and fever.

## 2013-09-14 NOTE — Discharge Instructions (Signed)
Urinary Frequency The number of times a normal person urinates depends upon how much liquid they take in and how much liquid they are losing. If the temperature is hot and there is high humidity then the person will sweat more and usually breathe a little more frequently. These factors decrease the amount of frequency of urination that would be considered normal. The amount you drink is easily determined, but the amount of fluid lost is sometimes more difficult to calculate.  Fluid is lost in two ways:  Sensible fluid loss is usually measured by the amount of urine that you get rid of. Losses of fluid can also occur with diarrhea.  Insensible fluid loss is more difficult to measure. It is caused by evaporation. Insensible loss of fluid occurs through breathing and sweating. It usually ranges from a little less than a quart to a little more than a quart of fluid a day. In normal temperatures and activity levels the average person may urinate 4 to 7 times in a 24-hour period. Needing to urinate more often than that could indicate a problem. If one urinates 4 to 7 times in 24 hours and has large volumes each time, that could indicate a different problem from one who urinates 4 to 7 times a day and has small volumes. The time of urinating is also an important. Most urinating should be done during the waking hours. Getting up at night to urinate frequently can indicate some problems. CAUSES  The bladder is the organ in your lower abdomen that holds urine. Like a balloon, it swells some as it fills up. Your nerves sense this and tell you it is time to head for the bathroom. There are a number of reasons that you might feel the need to urinate more often than usual. They include:  Urinary tract infection. This is usually associated with other signs such as burning when you urinate.  In men, problems with the prostate (a walnut-size gland that is located near the tube that carries urine out of your body).  There are two reasons why the prostate can cause an increased frequency of urination:  An enlarged prostate that does not let the bladder empty well. If the bladder only half empties when you urinate then it only has half the capacity to fill before you have to urinate again.  The nerves in the bladder become more hypersensitive with an increased size of the prostate even if the bladder empties completely.  Pregnancy.  Obesity. Excess weight is more likely to cause a problem for women more than for men.  Bladder stones or other bladder problems.  Caffeine.  Alcohol.  Medications. For example, drugs that help the body get rid of extra fluid (diuretics) increase urine production. Some other medicines must be taken with lots of fluids.  Muscle or nerve weakness. This might be the result of a spinal cord injury, a stroke, multiple sclerosis or Parkinson's disease.  Long-standing diabetes can decrease the sensation of the bladder. This loss of sensation makes it harder to sense the bladder needs to be emptied. Over a period of years the bladder is stretched out by constant overfilling. This weakens the bladder muscles so that the bladder does not empty well and has less capacity to fill with new urine.  Interstitial cystitis (also called painful bladder syndrome). This condition develops because the tissues that line the insider of the bladder are inflamed (inflammation is the body's way of reacting to injury or infection). It causes pain  and frequent urination. It occurs in women more often than in men. DIAGNOSIS   To decide what might be causing your urinary frequency, your healthcare provider will probably:  Ask about symptoms you have noticed.  Ask about your overall health. This will include questions about any medications you are taking.  Do a physical examination.  Order some tests. These might include:  A blood test to check for diabetes or other health issues that could be  contributing to the problem.  Urine testing. This could measure the flow of urine and the pressure on the bladder.  A test of your neurological system (the brain, spinal cord and nerves). This is the system that senses the need to urinate.  A bladder test to check whether it is emptying completely when you urinate.  Cytoscopy. This test uses a thin tube with a tiny camera on it. It offers a look inside your urethra and bladder to see if there are problems.  Imaging tests. You might be given a contrast dye and then asked to urinate. X-rays are taken to see how your bladder is working. TREATMENT  It is important for you to be evaluated to determine if the amount or frequency that you have is unusual or abnormal. If it is found to be abnormal the cause should be determined and this can usually be found out easily. Depending upon the cause treatment could include medication, stimulation of the nerves, or surgery. There are not too many things that you can do as an individual to change your urinary frequency. It is important that you balance the amount of fluid intake needed to compensate for your activity and the temperature. Medical problems will be diagnosed and taken care of by your physician. There is no particular bladder training such as Kegel's exercises that you can do to help urinary frequency. This is an exercise this is usually done for people who have leaking of urine when they laugh cough or sneeze. HOME CARE INSTRUCTIONS   Take any medications your healthcare provider prescribed or suggested. Follow the directions carefully.  Practice any lifestyle changes that are recommended. These might include:  Drinking less fluid or drinking at different times of the day. If you need to urinate often during the night, for example, you may need to stop drinking fluids early in the evening.  Cutting down on caffeine or alcohol. They both can make you need to urinate more often than normal.  Caffeine is found in coffee, tea and sodas.  Losing weight, if that is recommended.  Keep a journal or a log. You might be asked to record how much you drink and when and when you feel the need to urinate. This will also help evaluate how well the treatment provided by your physician is working. SEEK MEDICAL CARE IF:   Your need to urinate often gets worse.  You feel increased pain or irritation when you urinate.  You notice blood in your urine.  You have questions about any medications that your healthcare provider recommended.  You notice blood, pus or swelling at the site of any test or treatment procedure.  You develop a fever of more than 100.5 F (38.1 C). SEEK IMMEDIATE MEDICAL CARE IF:  You develop a fever of more than 102.0 F (38.9 C). Document Released: 12/18/2008 Document Revised: 05/16/2011 Document Reviewed: 12/18/2008 [facility] Patient Information 2015 [place], [place]. This information is not intended to replace advice given to you by your health care provider. Make sure you discuss  any questions you have with your health care provider. ° °

## 2013-10-09 ENCOUNTER — Other Ambulatory Visit (HOSPITAL_COMMUNITY): Payer: Self-pay | Admitting: Nurse Practitioner

## 2013-10-09 DIAGNOSIS — Z1231 Encounter for screening mammogram for malignant neoplasm of breast: Secondary | ICD-10-CM

## 2013-10-11 ENCOUNTER — Ambulatory Visit (HOSPITAL_COMMUNITY)
Admission: RE | Admit: 2013-10-11 | Discharge: 2013-10-11 | Disposition: A | Discharge status: Home / Self Care | Payer: Medicaid Other | Source: Ambulatory Visit | Attending: Nurse Practitioner | Admitting: Nurse Practitioner

## 2013-10-11 DIAGNOSIS — Z1231 Encounter for screening mammogram for malignant neoplasm of breast: Secondary | ICD-10-CM | POA: Insufficient documentation

## 2013-10-25 ENCOUNTER — Emergency Department (HOSPITAL_COMMUNITY): Payer: Medicaid Other

## 2013-10-25 ENCOUNTER — Emergency Department (HOSPITAL_COMMUNITY)
Admission: EM | Admit: 2013-10-25 | Discharge: 2013-10-25 | Disposition: A | Discharge status: Home / Self Care | Payer: Medicaid Other | Attending: Emergency Medicine | Admitting: Emergency Medicine

## 2013-10-25 ENCOUNTER — Encounter (HOSPITAL_COMMUNITY): Payer: Self-pay | Admitting: Emergency Medicine

## 2013-10-25 DIAGNOSIS — Z8739 Personal history of other diseases of the musculoskeletal system and connective tissue: Secondary | ICD-10-CM | POA: Insufficient documentation

## 2013-10-25 DIAGNOSIS — Z87891 Personal history of nicotine dependence: Secondary | ICD-10-CM | POA: Diagnosis not present

## 2013-10-25 DIAGNOSIS — F410 Panic disorder [episodic paroxysmal anxiety] without agoraphobia: Secondary | ICD-10-CM | POA: Diagnosis not present

## 2013-10-25 DIAGNOSIS — Z880 Allergy status to penicillin: Secondary | ICD-10-CM | POA: Diagnosis not present

## 2013-10-25 DIAGNOSIS — Z79899 Other long term (current) drug therapy: Secondary | ICD-10-CM | POA: Diagnosis not present

## 2013-10-25 DIAGNOSIS — M546 Pain in thoracic spine: Secondary | ICD-10-CM | POA: Diagnosis not present

## 2013-10-25 DIAGNOSIS — Z87442 Personal history of urinary calculi: Secondary | ICD-10-CM | POA: Insufficient documentation

## 2013-10-25 DIAGNOSIS — G43909 Migraine, unspecified, not intractable, without status migrainosus: Secondary | ICD-10-CM | POA: Insufficient documentation

## 2013-10-25 DIAGNOSIS — I10 Essential (primary) hypertension: Secondary | ICD-10-CM | POA: Insufficient documentation

## 2013-10-25 DIAGNOSIS — M549 Dorsalgia, unspecified: Secondary | ICD-10-CM | POA: Diagnosis present

## 2013-10-25 DIAGNOSIS — K219 Gastro-esophageal reflux disease without esophagitis: Secondary | ICD-10-CM | POA: Diagnosis not present

## 2013-10-25 NOTE — Discharge Instructions (Signed)
Back Pain, Adult Low back pain is very common. About 1 in 5 people have back pain.The cause of low back pain is rarely dangerous. The pain often gets better over time.About half of people with a sudden onset of back pain feel better in just 2 weeks. About 8 in 10 people feel better by 6 weeks.  CAUSES Some common causes of back pain include:  Strain of the muscles or ligaments supporting the spine.  Wear and tear (degeneration) of the spinal discs.  Arthritis.  Direct injury to the back. DIAGNOSIS Most of the time, the direct cause of low back pain is not known.However, back pain can be treated effectively even when the exact cause of the pain is unknown.Answering your caregiver's questions about your overall health and symptoms is one of the most accurate ways to make sure the cause of your pain is not dangerous. If your caregiver needs more information, he or she may order lab work or imaging tests (X-rays or MRIs).However, even if imaging tests show changes in your back, this usually does not require surgery. HOME CARE INSTRUCTIONS For many people, back pain returns.Since low back pain is rarely dangerous, it is often a condition that people can learn to manageon their own.   Remain active. It is stressful on the back to sit or stand in one place. Do not sit, drive, or stand in one place for more than 30 minutes at a time. Take short walks on level surfaces as soon as pain allows.Try to increase the length of time you walk each day.  Do not stay in bed.Resting more than 1 or 2 days can delay your recovery.  Do not avoid exercise or work.Your body is made to move.It is not dangerous to be active, even though your back may hurt.Your back will likely heal faster if you return to being active before your pain is gone.  Pay attention to your body when you bend and lift. Many people have less discomfortwhen lifting if they bend their knees, keep the load close to their bodies,and  avoid twisting. Often, the most comfortable positions are those that put less stress on your recovering back.  Find a comfortable position to sleep. Use a firm mattress and lie on your side with your knees slightly bent. If you lie on your back, put a pillow under your knees.  Only take over-the-counter or prescription medicines as directed by your caregiver. Over-the-counter medicines to reduce pain and inflammation are often the most helpful.Your caregiver may prescribe muscle relaxant drugs.These medicines help dull your pain so you can more quickly return to your normal activities and healthy exercise.  Put ice on the injured area.  Put ice in a plastic bag.  Place a towel between your skin and the bag.  Leave the ice on for 15-20 minutes, 03-04 times a day for the first 2 to 3 days. After that, ice and heat may be alternated to reduce pain and spasms.  Ask your caregiver about trying back exercises and gentle massage. This may be of some benefit.  Avoid feeling anxious or stressed.Stress increases muscle tension and can worsen back pain.It is important to recognize when you are anxious or stressed and learn ways to manage it.Exercise is a great option. SEEK MEDICAL CARE IF:  You have pain that is not relieved with rest or medicine.  You have pain that does not improve in 1 week.  You have new symptoms.  You are generally not feeling well. SEEK   IMMEDIATE MEDICAL CARE IF:   You have pain that radiates from your back into your legs.  You develop new bowel or bladder control problems.  You have unusual weakness or numbness in your arms or legs.  You develop nausea or vomiting.  You develop abdominal pain.  You feel faint. Document Released: 02/21/2005 Document Revised: 08/23/2011 Document Reviewed: 06/25/2013 [employer] Patient Information 2015 [employer]. This information is not intended to replace advice given to you by your health care provider. Make sure you  discuss any questions you have with your health care provider.  

## 2013-10-25 NOTE — ED Notes (Signed)
Pt reports strenuous exercising with lifting dumbbells above head recently.

## 2013-10-25 NOTE — ED Provider Notes (Signed)
CSN: [id number]     Arrival date & time 10/25/13  1128 History   First MD Initiated Contact with Patient 10/25/13 1201     Chief Complaint  Patient presents with  . Back Pain     (Consider location/radiation/quality/duration/timing/severity/associated sxs/prior Treatment) HPI Comments: Patient is a 63 year old female with history of hypertension, migraines, arthritis, GERD, anxiety who presents to the emergency department today for evaluation of back pain. She states that the pain is sharp and between her shoulder blades. She has no associated chest pain, shortness of breath, nausea, vomiting. While she was in the emergency department she had a large belch which relieved all of her symptoms. She currently has no pain and feels very well. She takes aciphex  4 GERD and took this tablet much later than she normally takes it.   The history is provided by the patient. No language interpreter was used.    Past Medical History  Diagnosis Date  . Hypertension   . Migraine   . Arthritis   . GERD (gastroesophageal reflux disease)   . Panic attack   . Anxiety   . Panic disorder   . UTI (lower urinary tract infection)    Past Surgical History  Procedure Laterality Date  . Breast surgery    . Abdominal hysterectomy      partial   Family History  Problem Relation Age of Onset  . Hypertension Mother   . Diabetes Father    History  Substance Use Topics  . Smoking status: Former Smoker -- 2.00 packs/day for 30 years    Types: Cigarettes    Quit date: 04/01/2002  . Smokeless tobacco: Not on file  . Alcohol Use: No   OB History   Grav Para Term Preterm Abortions TAB SAB Ect Mult Living   4 3 1 2 1  1   3      Review of Systems  Constitutional: Negative for fever and chills.  Respiratory: Negative for shortness of breath.   Cardiovascular: Negative for chest pain.  Gastrointestinal: Negative for nausea, vomiting and abdominal pain.  Musculoskeletal: Positive for back pain.  All  other systems reviewed and are negative.     Allergies  Cephalexin; Ibuprofen; Amoxicillin; Bactrim; Ciprofloxacin; Imitrex; Morphine and related; Tetracyclines & related; and Zoloft  Home Medications   Prior to Admission medications   Medication Sig Start Date End Date Taking? Authorizing Provider  Cholecalciferol (VITAMIN D PO) Take 1 tablet by mouth daily.   Yes Historical Provider, MD  lisinopril (PRINIVIL,ZESTRIL) 5 MG tablet Take 2.5 mg by mouth every morning.    Yes Historical Provider, MD  promethazine (PHENERGAN) 12.5 MG tablet Take 1 tablet (12.5 mg total) by mouth every 6 (six) hours as needed for nausea. 8/41/66  Yes Delora Fuel, MD  propranolol (INDERAL) 10 MG tablet Take 10 mg by mouth at bedtime.     Yes Historical Provider, MD  RABEprazole (ACIPHEX) 20 MG tablet Take 20 mg by mouth every morning.    Yes Historical Provider, MD   BP 150/77  Pulse 84  Temp(Src) 98.1 F (36.7 C) (Oral)  Resp 18  Ht 5\' 8"  (1.727 m)  Wt 225 lb (102.059 kg)  BMI 34.22 kg/m2  SpO2 97% Physical Exam  Nursing note and vitals reviewed. Constitutional: She is oriented to person, place, and time. She appears well-developed and well-nourished. No distress.  HENT:  Head: Normocephalic and atraumatic.  Right Ear: External ear normal.  Left Ear: External ear normal.  Nose: Nose normal.  Mouth/Throat: Oropharynx is clear and moist.  Eyes: Conjunctivae are normal.  Neck: Normal range of motion.  Cardiovascular: Normal rate, regular rhythm, normal heart sounds, intact distal pulses and normal pulses.   Pulses:      Radial pulses are 2+ on the right side, and 2+ on the left side.       Posterior tibial pulses are 2+ on the right side, and 2+ on the left side.  Pulmonary/Chest: Effort normal and breath sounds normal. No stridor. No respiratory distress. She has no wheezes. She has no rales.  No tenderness to palpation  Abdominal: Soft. She exhibits no distension.  Musculoskeletal: Normal range  of motion.  No tenderness to palpation  Neurological: She is alert and oriented to person, place, and time. She has normal strength.  Skin: Skin is warm and dry. No rash noted. She is not diaphoretic. No erythema.  No rashes or lesions back or chest  Psychiatric: She has a normal mood and affect. Her behavior is normal.    ED Course  Procedures (including critical care time) Labs Review Labs Reviewed - No data to display  Imaging Review Dg Chest 2 View  10/25/2013   CLINICAL DATA:  Chest pain  EXAM: CHEST  2 VIEW  COMPARISON:  05/19/2013  FINDINGS: Lungs are hyper aerated and clear. No pneumothorax or pleural effusion. Upper normal heart size.  IMPRESSION: Changes related to COPD.  No active disease.   Electronically Signed   By: Maryclare Bean M.D.   On: 10/25/2013 13:10     EKG Interpretation None      MDM   Final diagnoses:  Midline thoracic back pain    Patient presents to emergency department for evaluation of back pain. Back pain is completely resolved upon my evaluation. It resolved with large belch. Chest x-ray is unremarkable. No mediastinal widening. EKG is unremarkable. Discussed reasons to return to the emergency department. Vital signs stable for discharge. Patient / Family / Caregiver informed of clinical course, understand medical decision-making process, and agree with plan.     Elwyn Lade, PA-C 10/25/13 1520

## 2013-10-25 NOTE — ED Notes (Signed)
Patient with no complaints at this time. Respirations even and unlabored. Skin warm/dry. Discharge instructions reviewed with patient at this time. Patient given opportunity to voice concerns/ask questions. Patient discharged at this time and left Emergency Department with steady gait.   

## 2013-10-25 NOTE — ED Notes (Signed)
Upper back pain between shoulder blades starting after waking this morning.  Denies injury.  Denies sob/cp/n/v.

## 2013-10-25 NOTE — ED Notes (Signed)
Patient states she just belched and her upper back is feeling better.  Relates she has acid reflux and has taken her Aciphex this morning, but has not eaten.  States she is now having some mild abdominal cramping, but relates this to not eating.  States she has been lifting dumbbell weights, but this is not a new activity for her and she has not recently increased the amount she lifts.

## 2013-10-28 NOTE — ED Provider Notes (Signed)
Medical screening examination/treatment/procedure(s) were performed by non-physician practitioner and as supervising physician I was immediately available for consultation/collaboration.   EKG Interpretation   Date/Time:  Friday October 25 2013 11:33:52 EDT Ventricular Rate:  83 PR Interval:  164 QRS Duration: 76 QT Interval:  360 QTC Calculation: 423 R Axis:   48 Text Interpretation:  Normal sinus rhythm Cannot rule out Anterior infarct  , age undetermined Abnormal ECG ED PHYSICIAN INTERPRETATION AVAILABLE IN  [facility] [place] Confirmed by TEST, Record ([id number]) on 10/27/2013 1:59:02 PM        Maudry Diego, MD 10/28/13 1701

## 2013-12-04 DIAGNOSIS — I10 Essential (primary) hypertension: Secondary | ICD-10-CM | POA: Diagnosis not present

## 2013-12-04 DIAGNOSIS — G43909 Migraine, unspecified, not intractable, without status migrainosus: Secondary | ICD-10-CM | POA: Diagnosis not present

## 2013-12-04 DIAGNOSIS — Z79899 Other long term (current) drug therapy: Secondary | ICD-10-CM | POA: Diagnosis not present

## 2013-12-04 DIAGNOSIS — F410 Panic disorder [episodic paroxysmal anxiety] without agoraphobia: Secondary | ICD-10-CM | POA: Diagnosis not present

## 2013-12-04 DIAGNOSIS — Z880 Allergy status to penicillin: Secondary | ICD-10-CM | POA: Diagnosis not present

## 2013-12-04 DIAGNOSIS — Z0000 Encounter for general adult medical examination without abnormal findings: Secondary | ICD-10-CM | POA: Insufficient documentation

## 2013-12-04 DIAGNOSIS — B029 Zoster without complications: Secondary | ICD-10-CM | POA: Diagnosis not present

## 2013-12-04 DIAGNOSIS — R21 Rash and other nonspecific skin eruption: Secondary | ICD-10-CM | POA: Diagnosis present

## 2013-12-04 DIAGNOSIS — Z8739 Personal history of other diseases of the musculoskeletal system and connective tissue: Secondary | ICD-10-CM | POA: Diagnosis not present

## 2013-12-04 DIAGNOSIS — K219 Gastro-esophageal reflux disease without esophagitis: Secondary | ICD-10-CM | POA: Diagnosis not present

## 2013-12-04 DIAGNOSIS — Z87891 Personal history of nicotine dependence: Secondary | ICD-10-CM | POA: Diagnosis not present

## 2013-12-04 DIAGNOSIS — Z87440 Personal history of urinary (tract) infections: Secondary | ICD-10-CM | POA: Diagnosis not present

## 2013-12-05 ENCOUNTER — Emergency Department (HOSPITAL_COMMUNITY)
Admission: EM | Admit: 2013-12-05 | Discharge: 2013-12-05 | Disposition: A | Discharge status: Home / Self Care | Payer: Medicaid Other | Attending: Emergency Medicine | Admitting: Emergency Medicine

## 2013-12-05 ENCOUNTER — Encounter (HOSPITAL_COMMUNITY): Payer: Self-pay | Admitting: Emergency Medicine

## 2013-12-05 DIAGNOSIS — B029 Zoster without complications: Secondary | ICD-10-CM

## 2013-12-05 MED ORDER — PREDNISONE 10 MG PO TABS: 20.0000 mg | ORAL_TABLET | Freq: Two times a day (BID) | ORAL | Status: DC

## 2013-12-05 MED ORDER — VALACYCLOVIR HCL 1 G PO TABS: 1000.0000 mg | ORAL_TABLET | Freq: Three times a day (TID) | ORAL | Status: DC

## 2013-12-05 NOTE — ED Notes (Signed)
Pt c/o rash to the rt buttocks since Sunday.

## 2013-12-05 NOTE — ED Provider Notes (Signed)
CSN: [id number]     Arrival date & time 12/04/13  2330 History  This chart was scribed for Emily Speak, MD by Lowella Petties, ED Scribe. The patient was seen in room APA14/APA14. Patient's care was started at 12:38 AM.   Chief Complaint  Patient presents with  . Rash   Patient is a 63 y.o. female presenting with rash. The history is provided by the patient. No language interpreter was used.  Rash Location:  Ano-genital Ano-genital rash location:  R buttock Quality: painful and redness   Quality: not itchy   Pain details:    Quality:  Burning   Severity:  Moderate   Onset quality:  Sudden   Duration:  3 days   Timing:  Intermittent Severity:  Moderate Onset quality:  Sudden Duration:  3 days Timing:  Constant Progression:  Worsening Chronicity:  New Relieved by:  Nothing Worsened by:  Nothing tried Ineffective treatments:  None tried  HPI Comments: Emily Cooke is a 63 y.o. female who presents to the Emergency Department complaining of a burning rash on the right buttocks onset three days ago. She reports that there are three painful red areas. She states she is concerned that she might have been bitten by something. She denies a history of symptoms like this in the past.   Past Medical History  Diagnosis Date  . Hypertension   . Migraine   . Arthritis   . GERD (gastroesophageal reflux disease)   . Panic attack   . Anxiety   . Panic disorder   . UTI (lower urinary tract infection)    Past Surgical History  Procedure Laterality Date  . Breast surgery    . Abdominal hysterectomy      partial   Family History  Problem Relation Age of Onset  . Hypertension Mother   . Diabetes Father    History  Substance Use Topics  . Smoking status: Former Smoker -- 2.00 packs/day for 30 years    Types: Cigarettes    Quit date: 04/01/2002  . Smokeless tobacco: Not on file  . Alcohol Use: No   OB History   Grav Para Term Preterm Abortions TAB SAB Ect Mult Living   4 3  1 2 1  1   3      Review of Systems  Skin: Positive for rash.  All other systems reviewed and are negative.  Allergies  Cephalexin; Ibuprofen; Amoxicillin; Bactrim; Ciprofloxacin; Imitrex; Morphine and related; Tetracyclines & related; and Zoloft  Home Medications   Prior to Admission medications   Medication Sig Start Date End Date Taking? Authorizing Provider  Cholecalciferol (VITAMIN D PO) Take 1 tablet by mouth daily.    Historical Provider, MD  lisinopril (PRINIVIL,ZESTRIL) 5 MG tablet Take 2.5 mg by mouth every morning.     Historical Provider, MD  promethazine (PHENERGAN) 12.5 MG tablet Take 1 tablet (12.5 mg total) by mouth every 6 (six) hours as needed for nausea. 1/47/82   Delora Fuel, MD  propranolol (INDERAL) 10 MG tablet Take 10 mg by mouth at bedtime.      Historical Provider, MD  RABEprazole (ACIPHEX) 20 MG tablet Take 20 mg by mouth every morning.     Historical Provider, MD   Triage Vitals: BP 119/72  Pulse 67  Temp(Src) 98.1 F (36.7 C) (Oral)  Resp 17  Ht 5\' 8"  (1.727 m)  Wt 210 lb (95.255 kg)  BMI 31.94 kg/m2  SpO2 98% Physical Exam  Nursing note and vitals reviewed.  Constitutional: She is oriented to person, place, and time. She appears well-developed and well-nourished. No distress.  HENT:  Head: Normocephalic and atraumatic.  Eyes: Conjunctivae and EOM are normal. Pupils are equal, round, and reactive to light.  Neck: Neck supple. No tracheal deviation present.  Cardiovascular: Normal rate, regular rhythm and normal heart sounds.   No murmur heard. Pulmonary/Chest: Effort normal and breath sounds normal. No respiratory distress.  Abdominal: Soft. There is no tenderness.  Musculoskeletal: Normal range of motion.  Neurological: She is alert and oriented to person, place, and time. No cranial nerve deficit. She exhibits normal muscle tone. Coordination normal.  Skin: Skin is warm and dry. Rash noted.  The medial aspect of the right buttock is noted to  have several vesicular lesions.  Psychiatric: She has a normal mood and affect. Her behavior is normal.    ED Course  Procedures (including critical care time) DIAGNOSTIC STUDIES: Oxygen Saturation is 98% on room air, normal by my interpretation.    COORDINATION OF CARE: 12:42 AM-Discussed treatment plan which includes Prednisone and Acyclovir with pt at bedside and pt agreed to plan.   Labs Review Labs Reviewed - No data to display  Imaging Review No results found.   EKG Interpretation None      MDM   Final diagnoses:  None    This appears to be a shingles to the inside of her right buttock. We'll treat with prednisone, valacyclovir, and when necessary followup.  I personally performed the services described in this documentation, which was scribed in my presence. The recorded information has been reviewed and is accurate.      Emily Speak, MD 12/05/13 [phone]

## 2013-12-05 NOTE — Discharge Instructions (Signed)
Valacyclovir and prednisone as prescribed.  Return to the emergency department if you develop worsening rash, high fever, severe pain, or other new and concerning symptoms.   Shingles Shingles (herpes zoster) is an infection that is caused by the same virus that causes chickenpox (varicella). The infection causes a painful skin rash and fluid-filled blisters, which eventually break open, crust over, and heal. It may occur in any area of the body, but it usually affects only one side of the body or face. The pain of shingles usually lasts about 1 month. However, some people with shingles may develop long-term (chronic) pain in the affected area of the body. Shingles often occurs many years after the person had chickenpox. It is more common:  In people older than 50 years.  In people with weakened immune systems, such as those with HIV, AIDS, or cancer.  In people taking medicines that weaken the immune system, such as transplant medicines.  In people under great stress. CAUSES  Shingles is caused by the varicella zoster virus (VZV), which also causes chickenpox. After a person is infected with the virus, it can remain in the person's body for years in an inactive state (dormant). To cause shingles, the virus reactivates and breaks out as an infection in a nerve root. The virus can be spread from person to person (contagious) through contact with open blisters of the shingles rash. It will only spread to people who have not had chickenpox. When these people are exposed to the virus, they may develop chickenpox. They will not develop shingles. Once the blisters scab over, the person is no longer contagious and cannot spread the virus to others. SIGNS AND SYMPTOMS  Shingles shows up in stages. The initial symptoms may be pain, itching, and tingling in an area of the skin. This pain is usually described as burning, stabbing, or throbbing.In a few days or weeks, a painful red rash will appear in the  area where the pain, itching, and tingling were felt. The rash is usually on one side of the body in a band or belt-like pattern. Then, the rash usually turns into fluid-filled blisters. They will scab over and dry up in approximately 2-3 weeks. Flu-like symptoms may also occur with the initial symptoms, the rash, or the blisters. These may include:  Fever.  Chills.  Headache.  Upset stomach. DIAGNOSIS  Your health care provider will perform a skin exam to diagnose shingles. Skin scrapings or fluid samples may also be taken from the blisters. This sample will be examined under a microscope or sent to a lab for further testing. TREATMENT  There is no specific cure for shingles. Your health care provider will likely prescribe medicines to help you manage the pain, recover faster, and avoid long-term problems. This may include antiviral drugs, anti-inflammatory drugs, and pain medicines. HOME CARE INSTRUCTIONS   Take a cool bath or apply cool compresses to the area of the rash or blisters as directed. This may help with the pain and itching.   Take medicines only as directed by your health care provider.   Rest as directed by your health care provider.  Keep your rash and blisters clean with mild soap and cool water or as directed by your health care provider.  Do not pick your blisters or scratch your rash. Apply an anti-itch cream or numbing creams to the affected area as directed by your health care provider.  Keep your shingles rash covered with a loose bandage (dressing).  Avoid skin  contact with:  Babies.   Pregnant women.   Children with eczema.   Elderly people with transplants.   People with chronic illnesses, such as leukemia or AIDS.   Wear loose-fitting clothing to help ease the pain of material rubbing against the rash.  Keep all follow-up visits as directed by your health care provider.If the area involved is on your face, you may receive a referral for a  specialist, such as an eye doctor (ophthalmologist) or an ear, nose, and throat (ENT) doctor. Keeping all follow-up visits will help you avoid eye problems, chronic pain, or disability.  SEEK IMMEDIATE MEDICAL CARE IF:   You have facial pain, pain around the eye area, or loss of feeling on one side of your face.  You have ear pain or ringing in your ear.  You have loss of taste.  Your pain is not relieved with prescribed medicines.   Your redness or swelling spreads.   You have more pain and swelling.  Your condition is worsening or has changed.   You have a fever. MAKE SURE YOU:  Understand these instructions.  Will watch your condition.  Will get help right away if you are not doing well or get worse. Document Released: 02/21/2005 Document Revised: 07/08/2013 Document Reviewed: 10/06/2011 [facility] Patient Information 2015 [place], [place]. This information is not intended to replace advice given to you by your health care provider. Make sure you discuss any questions you have with your health care provider.

## 2013-12-29 ENCOUNTER — Encounter (HOSPITAL_COMMUNITY): Payer: Self-pay | Admitting: Emergency Medicine

## 2013-12-29 ENCOUNTER — Emergency Department (HOSPITAL_COMMUNITY)
Admission: EM | Admit: 2013-12-29 | Discharge: 2013-12-29 | Disposition: A | Discharge status: Home / Self Care | Payer: Medicaid Other | Attending: Emergency Medicine | Admitting: Emergency Medicine

## 2013-12-29 DIAGNOSIS — Z8659 Personal history of other mental and behavioral disorders: Secondary | ICD-10-CM | POA: Insufficient documentation

## 2013-12-29 DIAGNOSIS — K219 Gastro-esophageal reflux disease without esophagitis: Secondary | ICD-10-CM | POA: Diagnosis not present

## 2013-12-29 DIAGNOSIS — R42 Dizziness and giddiness: Secondary | ICD-10-CM | POA: Diagnosis not present

## 2013-12-29 DIAGNOSIS — Z87440 Personal history of urinary (tract) infections: Secondary | ICD-10-CM | POA: Insufficient documentation

## 2013-12-29 DIAGNOSIS — Z79899 Other long term (current) drug therapy: Secondary | ICD-10-CM | POA: Diagnosis not present

## 2013-12-29 DIAGNOSIS — Z87891 Personal history of nicotine dependence: Secondary | ICD-10-CM | POA: Insufficient documentation

## 2013-12-29 DIAGNOSIS — M1990 Unspecified osteoarthritis, unspecified site: Secondary | ICD-10-CM | POA: Diagnosis not present

## 2013-12-29 DIAGNOSIS — I10 Essential (primary) hypertension: Secondary | ICD-10-CM | POA: Diagnosis not present

## 2013-12-29 DIAGNOSIS — G43909 Migraine, unspecified, not intractable, without status migrainosus: Secondary | ICD-10-CM | POA: Insufficient documentation

## 2013-12-29 DIAGNOSIS — Z7952 Long term (current) use of systemic steroids: Secondary | ICD-10-CM | POA: Diagnosis not present

## 2013-12-29 DIAGNOSIS — Z880 Allergy status to penicillin: Secondary | ICD-10-CM | POA: Insufficient documentation

## 2013-12-29 HISTORY — DX: Dizziness and giddiness: R42

## 2013-12-29 LAB — CBC WITH DIFFERENTIAL/PLATELET
Basophils Absolute: 0 10*3/uL (ref 0.0–0.1)
Basophils Relative: 0 % (ref 0–1)
EOS ABS: 0.2 10*3/uL (ref 0.0–0.7)
EOS PCT: 3 % (ref 0–5)
HEMATOCRIT: 38 % (ref 36.0–46.0)
HEMOGLOBIN: 13 g/dL (ref 12.0–15.0)
LYMPHS PCT: 43 % (ref 12–46)
Lymphs Abs: 2.6 10*3/uL (ref 0.7–4.0)
MCH: 30.5 pg (ref 26.0–34.0)
MCHC: 34.2 g/dL (ref 30.0–36.0)
MCV: 89.2 fL (ref 78.0–100.0)
MONOS PCT: 5 % (ref 3–12)
Monocytes Absolute: 0.3 10*3/uL (ref 0.1–1.0)
Neutro Abs: 2.9 10*3/uL (ref 1.7–7.7)
Neutrophils Relative %: 49 % (ref 43–77)
Platelets: 224 10*3/uL (ref 150–400)
RBC: 4.26 MIL/uL (ref 3.87–5.11)
RDW: 12.9 % (ref 11.5–15.5)
WBC: 5.9 10*3/uL (ref 4.0–10.5)

## 2013-12-29 LAB — BASIC METABOLIC PANEL
Anion gap: 12 (ref 5–15)
BUN: 8 mg/dL (ref 6–23)
CALCIUM: 9.2 mg/dL (ref 8.4–10.5)
CO2: 29 mEq/L (ref 19–32)
Chloride: 102 mEq/L (ref 96–112)
Creatinine, Ser: 0.6 mg/dL (ref 0.50–1.10)
GFR calc Af Amer: >90 mL/min (ref >90)
Glucose, Bld: 91 mg/dL (ref 70–99)
POTASSIUM: 3.5 meq/L — AB (ref 3.7–5.3)
SODIUM: 143 meq/L (ref 137–147)

## 2013-12-29 MED ORDER — MECLIZINE HCL 12.5 MG PO TABS
25.0000 mg | ORAL_TABLET | Freq: Once | ORAL | Status: AC
  Administered 2013-12-29: 25 mg via ORAL
  Filled 2013-12-29: qty 2

## 2013-12-29 MED ORDER — MECLIZINE HCL 50 MG PO TABS: 50.0000 mg | ORAL_TABLET | Freq: Three times a day (TID) | ORAL | Status: DC | PRN

## 2013-12-29 NOTE — ED Notes (Signed)
Pt with dizziness off and on today with HA earlier, denies HA at this time

## 2013-12-29 NOTE — Discharge Instructions (Signed)
Prescription for Meclizine [Antivert].   Rest

## 2013-12-29 NOTE — ED Provider Notes (Signed)
CSN: [id number]     Arrival date & time 12/29/13  1619 History  This chart was scribed for Emily Christen, MD by Molli Posey, ED Scribe. This patient was seen in room APA19/APA19 and the patient's care was started 5:24 PM.    Chief Complaint  Patient presents with  . Dizziness    The history is provided by the patient. No language interpreter was used.   HPI Comments: Emily Cooke is a 63 y.o. female with a history of migraines who presents to the Emergency Department complaining of intermittent dizziness that started this morning that began with a HA. She states she feels like the room is spinning and associated light headedness. She denies HA at this time. Pt reports she had a similar episode a while ago and was given Antivert which improved her symptoms. She reports she is ambulatory. She declines ear pain and nausea as symptoms.   PCP [place] [facility]   Past Medical History  Diagnosis Date  . Hypertension   . Migraine   . Arthritis   . GERD (gastroesophageal reflux disease)   . Panic attack   . Anxiety   . Panic disorder   . UTI (lower urinary tract infection)   . Vertigo    Past Surgical History  Procedure Laterality Date  . Breast surgery    . Abdominal hysterectomy      partial   Family History  Problem Relation Age of Onset  . Hypertension Mother   . Diabetes Father    History  Substance Use Topics  . Smoking status: Former Smoker -- 2.00 packs/day for 30 years    Types: Cigarettes    Quit date: 04/01/2002  . Smokeless tobacco: Not on file  . Alcohol Use: No   OB History   Grav Para Term Preterm Abortions TAB SAB Ect Mult Living   4 3 1 2 1  1   3      Review of Systems  HENT: Negative for ear pain.   Gastrointestinal: Negative for nausea.  Neurological: Positive for dizziness and light-headedness.  All other systems reviewed and are negative.     Allergies  Cephalexin; Ibuprofen; Amoxicillin; Bactrim; Ciprofloxacin; Imitrex; Morphine and related;  Tetracyclines & related; and Zoloft  Home Medications   Prior to Admission medications   Medication Sig Start Date End Date Taking? Authorizing Provider  Cholecalciferol (VITAMIN D PO) Take 1 tablet by mouth daily.    Historical Provider, MD  lisinopril (PRINIVIL,ZESTRIL) 5 MG tablet Take 2.5 mg by mouth every morning.     Historical Provider, MD  meclizine (ANTIVERT) 50 MG tablet Take 1 tablet (50 mg total) by mouth 3 (three) times daily as needed for dizziness. 12/29/13   Emily Christen, MD  predniSONE (DELTASONE) 10 MG tablet Take 2 tablets (20 mg total) by mouth 2 (two) times daily. 12/05/13   [name], MD  promethazine (PHENERGAN) 12.5 MG tablet Take 1 tablet (12.5 mg total) by mouth every 6 (six) hours as needed for nausea. 09/09/21   Delora Fuel, MD  propranolol (INDERAL) 10 MG tablet Take 10 mg by mouth at bedtime.      Historical Provider, MD  RABEprazole (ACIPHEX) 20 MG tablet Take 20 mg by mouth every morning.     Historical Provider, MD  valACYclovir (VALTREX) 1000 MG tablet Take 1 tablet (1,000 mg total) by mouth 3 (three) times daily. 12/05/13   [name], MD   BP 158/74  Pulse 76  Temp(Src) 98.2 F (36.8 C) (Oral)  Resp 19  Ht 5\' 8"  (1.727 m)  Wt 200 lb (90.719 kg)  BMI 30.42 kg/m2  SpO2 100%  Physical Exam  Nursing note and vitals reviewed. Constitutional: She is oriented to person, place, and time. She appears well-developed and well-nourished.  HENT:  Head: Normocephalic and atraumatic.  Eyes: Conjunctivae and EOM are normal. Pupils are equal, round, and reactive to light.  Neck: Normal range of motion. Neck supple.  Cardiovascular: Normal rate, regular rhythm and normal heart sounds.   Pulmonary/Chest: Effort normal and breath sounds normal.  Abdominal: Soft. Bowel sounds are normal.  Musculoskeletal: Normal range of motion.  Neurological: She is alert and oriented to person, place, and time.  Skin: Skin is warm and dry.  Psychiatric: She has a normal mood and  affect. Her behavior is normal.    ED Course  Procedures  DIAGNOSTIC STUDIES: Oxygen Saturation is 100% on RA, normal by my interpretation.    COORDINATION OF CARE: 5:29 PM Discussed treatment plan with pt at bedside and pt agreed to plan. Will treat with Meclizine.    Labs Review Labs Reviewed  CBC WITH DIFFERENTIAL  BASIC METABOLIC PANEL    Imaging Review No results found.   EKG Interpretation None      MDM   Final diagnoses:  Vertigo   No neurological deficits. Alert. Ambulatory. Suspect uncomplicated vertigo. Rx meclizine 25 mg when necessary.  I personally performed the services described in this documentation, which was scribed in my presence. The recorded information has been reviewed and is accurate.      Emily Christen, MD 12/29/13 [phone]

## 2013-12-29 NOTE — ED Notes (Addendum)
Pt states she is having dizziness intermittently since 1000 this morning. Pt has history of the same and was treated with Antivert.

## 2014-01-06 ENCOUNTER — Encounter (HOSPITAL_COMMUNITY): Payer: Self-pay | Admitting: Emergency Medicine

## 2014-01-08 ENCOUNTER — Emergency Department (HOSPITAL_COMMUNITY)
Admission: EM | Admit: 2014-01-08 | Discharge: 2014-01-08 | Disposition: A | Discharge status: Home / Self Care | Payer: Medicaid Other | Attending: Emergency Medicine | Admitting: Emergency Medicine

## 2014-01-08 ENCOUNTER — Encounter (HOSPITAL_COMMUNITY): Payer: Self-pay

## 2014-01-08 DIAGNOSIS — Z87891 Personal history of nicotine dependence: Secondary | ICD-10-CM | POA: Insufficient documentation

## 2014-01-08 DIAGNOSIS — Z87440 Personal history of urinary (tract) infections: Secondary | ICD-10-CM | POA: Insufficient documentation

## 2014-01-08 DIAGNOSIS — K219 Gastro-esophageal reflux disease without esophagitis: Secondary | ICD-10-CM | POA: Diagnosis not present

## 2014-01-08 DIAGNOSIS — R42 Dizziness and giddiness: Secondary | ICD-10-CM | POA: Insufficient documentation

## 2014-01-08 DIAGNOSIS — M1990 Unspecified osteoarthritis, unspecified site: Secondary | ICD-10-CM | POA: Diagnosis not present

## 2014-01-08 DIAGNOSIS — I10 Essential (primary) hypertension: Secondary | ICD-10-CM | POA: Insufficient documentation

## 2014-01-08 DIAGNOSIS — Z7952 Long term (current) use of systemic steroids: Secondary | ICD-10-CM | POA: Diagnosis not present

## 2014-01-08 DIAGNOSIS — Z79899 Other long term (current) drug therapy: Secondary | ICD-10-CM | POA: Insufficient documentation

## 2014-01-08 DIAGNOSIS — H6121 Impacted cerumen, right ear: Secondary | ICD-10-CM | POA: Diagnosis not present

## 2014-01-08 DIAGNOSIS — G43909 Migraine, unspecified, not intractable, without status migrainosus: Secondary | ICD-10-CM | POA: Insufficient documentation

## 2014-01-08 MED ORDER — ONDANSETRON 4 MG PO TBDP
4.0000 mg | ORAL_TABLET | Freq: Once | ORAL | Status: AC
  Administered 2014-01-08: 4 mg via ORAL
  Filled 2014-01-08: qty 1

## 2014-01-08 MED ORDER — ONDANSETRON 4 MG PO TBDP: ORAL_TABLET | ORAL | Status: DC

## 2014-01-08 NOTE — Discharge Instructions (Signed)

## 2014-01-08 NOTE — ED Notes (Signed)
Pt reports being dizzy. Started about 1 hour ago. Pt took a meclizine she was prescribed last month for the same but came to the ER before it had time to work.

## 2014-01-08 NOTE — ED Notes (Signed)
Pt left ED via wheelchair with family by private vehicle. No c/o pain or distress noted. Pt verbalizes understanding of discharge instructions.

## 2014-01-08 NOTE — ED Provider Notes (Signed)
CSN: [id number]     Arrival date & time 01/08/14  0122 History   First MD Initiated Contact with Patient 01/08/14 0131     Chief Complaint  Patient presents with  . Dizziness     (Consider location/radiation/quality/duration/timing/severity/associated sxs/prior Treatment) HPI Patient has a history of Mnire's disease and vertigo. She presents with acute onset room spinning sensation starting about 1 hour prior to presentation. She was seen last week for similar symptoms. She was prescribed meclizine and took a dose prior to coming into the emergency department.patient has a history of anxiety and states that she got scared and decided to be evaluated.she complains of bilateral ear fullness but denies any hearing changes. He's had no fever or chills. She denies any nasal congestion. No sinus fullness. She is no focal weakness or numbness. She states that her dizziness is improved but still has mild nausea. Past Medical History  Diagnosis Date  . Hypertension   . Migraine   . Arthritis   . GERD (gastroesophageal reflux disease)   . Panic attack   . Anxiety   . Panic disorder   . UTI (lower urinary tract infection)   . Vertigo    Past Surgical History  Procedure Laterality Date  . Breast surgery    . Abdominal hysterectomy      partial   Family History  Problem Relation Age of Onset  . Hypertension Mother   . Diabetes Father    History  Substance Use Topics  . Smoking status: Former Smoker -- 2.00 packs/day for 30 years    Types: Cigarettes    Quit date: 04/01/2002  . Smokeless tobacco: Not on file  . Alcohol Use: No   OB History    Gravida Para Term Preterm AB TAB SAB Ectopic Multiple Living   4 3 1 2 1  1   3      Review of Systems  Constitutional: Negative for fever and chills.  HENT: Negative for congestion, ear pain, hearing loss, rhinorrhea, sinus pressure, sore throat and tinnitus.   Eyes: Negative for photophobia and visual disturbance.  Respiratory: Negative  for chest tightness and shortness of breath.   Cardiovascular: Negative for chest pain and palpitations.  Gastrointestinal: Positive for nausea. Negative for vomiting, abdominal pain and diarrhea.  Musculoskeletal: Negative for back pain, neck pain and neck stiffness.  Skin: Negative for pallor and rash.  Neurological: Positive for dizziness. Negative for syncope, weakness, light-headedness, numbness and headaches.  Psychiatric/Behavioral: The patient is nervous/anxious.   All other systems reviewed and are negative.     Allergies  Cephalexin; Ibuprofen; Amoxicillin; Bactrim; Ciprofloxacin; Imitrex; Morphine and related; Tetracyclines & related; and Zoloft  Home Medications   Prior to Admission medications   Medication Sig Start Date End Date Taking? Authorizing Provider  Cholecalciferol (VITAMIN D PO) Take 1 tablet by mouth daily.   Yes Historical Provider, MD  lisinopril (PRINIVIL,ZESTRIL) 5 MG tablet Take 2.5 mg by mouth every morning.    Yes Historical Provider, MD  meclizine (ANTIVERT) 50 MG tablet Take 1 tablet (50 mg total) by mouth 3 (three) times daily as needed for dizziness. 12/29/13  Yes [name], MD  promethazine (PHENERGAN) 12.5 MG tablet Take 1 tablet (12.5 mg total) by mouth every 6 (six) hours as needed for nausea. 9/89/21  Yes Delora Fuel, MD  propranolol (INDERAL) 10 MG tablet Take 10 mg by mouth at bedtime.     Yes Historical Provider, MD  RABEprazole (ACIPHEX) 20 MG tablet Take 20 mg by  mouth every morning.    Yes Historical Provider, MD  predniSONE (DELTASONE) 10 MG tablet Take 2 tablets (20 mg total) by mouth 2 (two) times daily. 12/05/13   [name], MD  valACYclovir (VALTREX) 1000 MG tablet Take 1 tablet (1,000 mg total) by mouth 3 (three) times daily. 12/05/13   [name], MD   BP 154/75 mmHg  Pulse 75  Temp(Src) 97.7 F (36.5 C) (Oral)  Resp 18  Ht 5\' 8"  (1.727 m)  Wt 200 lb (90.719 kg)  BMI 30.42 kg/m2  SpO2 98% Physical Exam  Constitutional:  She is oriented to person, place, and time. She appears well-developed and well-nourished. No distress.  HENT:  Head: Normocephalic and atraumatic.  Mouth/Throat: Oropharynx is clear and moist.  Cerumen obstructing the right TM. Left TM mild bulging. No erythema. No sinus tenderness to percussion.  Eyes: EOM are normal. Pupils are equal, round, and reactive to light.  Mild fatigable rotary nystagmus.  Neck: Normal range of motion. Neck supple.  Cardiovascular: Normal rate and regular rhythm.   Pulmonary/Chest: Effort normal and breath sounds normal. No respiratory distress. She has no wheezes. She has no rales.  Abdominal: Soft. Bowel sounds are normal. She exhibits no distension and no mass. There is no tenderness. There is no rebound and no guarding.  Musculoskeletal: Normal range of motion. She exhibits no edema or tenderness.  Neurological: She is alert and oriented to person, place, and time.  Patient is alert and oriented x3 with clear, goal oriented speech. Patient has 5/5 motor in all extremities. Sensation is intact to light touch. Bilateral finger-to-nose is normal with no signs of dysmetria.   Skin: Skin is warm and dry. No rash noted. No erythema.  Psychiatric: She has a normal mood and affect. Her behavior is normal.  Nursing note and vitals reviewed.   ED Course  Procedures (including critical care time) Labs Review Labs Reviewed - No data to display  Imaging Review No results found.   EKG Interpretation None      Date: 01/08/2014  Rate: 85  Rhythm: normal sinus rhythm  QRS Axis: normal  Intervals: normal  ST/T Wave abnormalities: normal  Conduction Disutrbances:none  Narrative Interpretation:   Old EKG Reviewed: none available   MDM   Final diagnoses:  None    Patient with vertigo likely peripheral. Improve symptoms. Will reassess.  Patient living in the ED. Symptoms have resolved. Advised to follow-up with ENT as an outpatient for continued symptoms.  Return precautions given.    [name], MD 01/08/14 [phone]

## 2014-01-08 NOTE — ED Notes (Signed)
Pt ambulated to restroom & returned to room w/ no complications. 

## 2014-01-08 NOTE — ED Notes (Signed)
Dizzy, was seen on October 25 for the same thing. I took an antivert 15 minutes ago but I got scared and decided to come in. Started suddenly, ears feel full, and I am thirsty per pt.

## 2014-02-22 ENCOUNTER — Encounter (HOSPITAL_COMMUNITY): Payer: Self-pay | Admitting: Emergency Medicine

## 2014-02-22 ENCOUNTER — Emergency Department (HOSPITAL_COMMUNITY)
Admission: EM | Admit: 2014-02-22 | Discharge: 2014-02-23 | Disposition: A | Discharge status: Home / Self Care | Payer: Medicaid Other | Attending: Emergency Medicine | Admitting: Emergency Medicine

## 2014-02-22 DIAGNOSIS — Z8739 Personal history of other diseases of the musculoskeletal system and connective tissue: Secondary | ICD-10-CM | POA: Diagnosis not present

## 2014-02-22 DIAGNOSIS — G43909 Migraine, unspecified, not intractable, without status migrainosus: Secondary | ICD-10-CM | POA: Insufficient documentation

## 2014-02-22 DIAGNOSIS — Z87440 Personal history of urinary (tract) infections: Secondary | ICD-10-CM | POA: Diagnosis not present

## 2014-02-22 DIAGNOSIS — I10 Essential (primary) hypertension: Secondary | ICD-10-CM | POA: Insufficient documentation

## 2014-02-22 DIAGNOSIS — Z7952 Long term (current) use of systemic steroids: Secondary | ICD-10-CM | POA: Diagnosis not present

## 2014-02-22 DIAGNOSIS — F419 Anxiety disorder, unspecified: Secondary | ICD-10-CM | POA: Diagnosis not present

## 2014-02-22 DIAGNOSIS — K219 Gastro-esophageal reflux disease without esophagitis: Secondary | ICD-10-CM | POA: Insufficient documentation

## 2014-02-22 DIAGNOSIS — B349 Viral infection, unspecified: Secondary | ICD-10-CM

## 2014-02-22 DIAGNOSIS — Z880 Allergy status to penicillin: Secondary | ICD-10-CM | POA: Diagnosis not present

## 2014-02-22 DIAGNOSIS — Z87891 Personal history of nicotine dependence: Secondary | ICD-10-CM | POA: Diagnosis not present

## 2014-02-22 DIAGNOSIS — Z79899 Other long term (current) drug therapy: Secondary | ICD-10-CM | POA: Diagnosis not present

## 2014-02-22 DIAGNOSIS — J029 Acute pharyngitis, unspecified: Secondary | ICD-10-CM | POA: Diagnosis not present

## 2014-02-22 NOTE — ED Notes (Signed)
Patient states she has sore throat, bilateral ear pain and that she's "freezing" x 2-3 days.

## 2014-02-23 LAB — RAPID STREP SCREEN (MED CTR MEBANE ONLY): STREPTOCOCCUS, GROUP A SCREEN (DIRECT): NEGATIVE

## 2014-02-23 MED ORDER — ACETAMINOPHEN 500 MG PO TABS
1000.0000 mg | ORAL_TABLET | Freq: Once | ORAL | Status: AC
  Administered 2014-02-23: 1000 mg via ORAL
  Filled 2014-02-23: qty 2

## 2014-02-23 MED ORDER — NAPROXEN 250 MG PO TABS
500.0000 mg | ORAL_TABLET | Freq: Once | ORAL | Status: AC
  Administered 2014-02-23: 500 mg via ORAL
  Filled 2014-02-23: qty 2

## 2014-02-23 NOTE — ED Provider Notes (Signed)
CSN: [id number]     Arrival date & time 02/22/14  2328 History  This chart was scribed for Janice Norrie, MD by Peyton Bottoms, ED Scribe. This patient was seen in room APA10/APA10 and the patient's care was started at 12:11 AM.   Chief Complaint  Patient presents with  . Sore Throat   Patient is a 63 y.o. female presenting with pharyngitis. The history is provided by the patient. No language interpreter was used.  Sore Throat  HPI Comments: Emily Cooke is a 63 y.o. female with a prior history of hypertension, migraine, arthritis, GERD, panic attack, UTI, vertigo, and sore throat, who presents to the Emergency Department complaining of sore throat and otalgia that began 2 to 3 days ago. She states she had associated onset of chills earlier today. She also reports nonproductive cough. She denies associated nausea, vomiting, diarrhea. She has sick contact with her daughter who is having "allergy" symptoms.   She is currently taking Propanolol, Flexeril, Phenergan.   PCP [facility]   Past Medical History  Diagnosis Date  . Hypertension   . Migraine   . Arthritis   . GERD (gastroesophageal reflux disease)   . Panic attack   . Anxiety   . Panic disorder   . UTI (lower urinary tract infection)   . Vertigo    Past Surgical History  Procedure Laterality Date  . Breast surgery    . Abdominal hysterectomy      partial   Family History  Problem Relation Age of Onset  . Hypertension Mother   . Diabetes Father    History  Substance Use Topics  . Smoking status: Former Smoker -- 2.00 packs/day for 30 years    Types: Cigarettes    Quit date: 04/01/2002  . Smokeless tobacco: Not on file  . Alcohol Use: No   On disability for panic attacks, migraines and back spasms  OB History    Gravida Para Term Preterm AB TAB SAB Ectopic Multiple Living   4 3 1 2 1  1   3      Review of Systems  Constitutional: Positive for chills.  HENT: Positive for ear pain  and sore throat.   Respiratory: Positive for cough.   All other systems reviewed and are negative.  Allergies  Cephalexin; Ibuprofen; Amoxicillin; Bactrim; Ciprofloxacin; Imitrex; Morphine and related; Tetracyclines & related; and Zoloft  Home Medications   Prior to Admission medications   Medication Sig Start Date End Date Taking? Authorizing Provider  meclizine (ANTIVERT) 50 MG tablet Take 1 tablet (50 mg total) by mouth 3 (three) times daily as needed for dizziness. 12/29/13  Yes [name], MD  ondansetron (ZOFRAN ODT) 4 MG disintegrating tablet 4mg  ODT q4 hours prn nausea/vomit 01/08/14  Yes [name], MD  promethazine (PHENERGAN) 12.5 MG tablet Take 1 tablet (12.5 mg total) by mouth every 6 (six) hours as needed for nausea. 3/70/48  Yes Delora Fuel, MD  propranolol (INDERAL) 10 MG tablet Take 10 mg by mouth at bedtime.     Yes Historical Provider, MD  RABEprazole (ACIPHEX) 20 MG tablet Take 20 mg by mouth every morning.    Yes Historical Provider, MD  Cholecalciferol (VITAMIN D PO) Take 1 tablet by mouth daily.    Historical Provider, MD  lisinopril (PRINIVIL,ZESTRIL) 5 MG tablet Take 2.5 mg by mouth every morning.     Historical Provider, MD  predniSONE (DELTASONE) 10 MG tablet Take 2 tablets (20 mg total) by mouth 2 (  two) times daily. 12/05/13   [name], MD  valACYclovir (VALTREX) 1000 MG tablet Take 1 tablet (1,000 mg total) by mouth 3 (three) times daily. 12/05/13   [name], MD   Triage Vitals: BP 160/85 mmHg  Pulse 105  Temp(Src) 99.8 F (37.7 C) (Oral)  Resp 18  Ht 5\' 8"  (1.727 m)  Wt 200 lb (90.719 kg)  BMI 30.42 kg/m2  SpO2 98%  Vital signs normal    Physical Exam  Constitutional: She is oriented to person, place, and time. She appears well-developed and well-nourished.  Non-toxic appearance. She does not appear ill. No distress.  HENT:  Head: Normocephalic and atraumatic.  Right Ear: Tympanic membrane, external ear and ear canal normal. No drainage or  swelling. Tympanic membrane is not injected, not scarred, not perforated and not erythematous. No hemotympanum.  Left Ear: Tympanic membrane, external ear and ear canal normal. No drainage or swelling. Tympanic membrane is not injected, not scarred, not perforated and not erythematous. No hemotympanum.  Nose: Nose normal. No mucosal edema or rhinorrhea.  Mouth/Throat: Oropharynx is clear and moist and mucous membranes are normal. No dental abscesses or uvula swelling. No oropharyngeal exudate, posterior oropharyngeal edema or posterior oropharyngeal erythema.  Normal ear exam.  Patient's oral pharynx is normal.  Eyes: Conjunctivae and EOM are normal. Pupils are equal, round, and reactive to light.  Neck: Normal range of motion and full passive range of motion without pain. Neck supple.  No cervical lymadenopathy noted.  Cardiovascular: Normal rate, regular rhythm and normal heart sounds.  Exam reveals no gallop and no friction rub.   No murmur heard. Pulmonary/Chest: Effort normal and breath sounds normal. No respiratory distress. She has no wheezes. She has no rhonchi. She has no rales. She exhibits no tenderness and no crepitus.  Abdominal: Soft. Normal appearance and bowel sounds are normal. She exhibits no distension. There is no tenderness. There is no rebound and no guarding.  Musculoskeletal: Normal range of motion. She exhibits no edema or tenderness.  Moves all extremities well.   Lymphadenopathy:    She has no cervical adenopathy.  Neurological: She is alert and oriented to person, place, and time. She has normal strength. No cranial nerve deficit.  Skin: Skin is warm, dry and intact. No rash noted. No erythema. No pallor.  Psychiatric: She has a normal mood and affect. Her speech is normal and behavior is normal. Her mood appears not anxious.  Nursing note and vitals reviewed.  ED Course  Procedures (including critical care time)  Medications  acetaminophen (TYLENOL) tablet 1,000  mg (1,000 mg Oral Given 02/23/14 0040)  naproxen (NAPROSYN) tablet 500 mg (500 mg Oral Given 02/23/14 0041)   DIAGNOSTIC STUDIES: Oxygen Saturation is 98% on RA, normal by my interpretation.    COORDINATION OF CARE: 12:14 AM- Discussed plans to order diagnostic strep test. Will give patient Tylenol 1000mg  and Naprosyn 500mg . Pt advised of plan for treatment and pt agrees.  At time of discharge patient states she feels much improved.  Labs Review Results for orders placed or performed during the hospital encounter of 02/22/14  Rapid strep screen  Result Value Ref Range   Streptococcus, Group A Screen (Direct) NEGATIVE NEGATIVE       Imaging Review No results found.   EKG Interpretation None     MDM   Final diagnoses:  Pharyngitis with viral syndrome     Plan discharge  Rolland Porter, MD, FACEP   I personally performed the services described in  this documentation, which was scribed in my presence. The recorded information has been reviewed and considered.  Rolland Porter, MD, FACEP   Janice Norrie, MD 02/23/14 [phone]

## 2014-02-23 NOTE — Discharge Instructions (Signed)
Drink plenty of fluids. Take acetaminophen 1000 mg 4 times a day+ aleve 2 tabs OTC twice a day for fever, body aches and pain.  Recheck if you aren't improving in the next 2-3 days.     Pharyngitis Pharyngitis is a sore throat (pharynx). There is redness, pain, and swelling of your throat. HOME CARE   Drink enough fluids to keep your pee (urine) clear or pale yellow.  Only take medicine as told by your doctor.  You may get sick again if you do not take medicine as told. Finish your medicines, even if you start to feel better.  Do not take aspirin.  Rest.  Rinse your mouth (gargle) with salt water ( tsp of salt per 1 qt of water) every 1-2 hours. This will help the pain.  If you are not at risk for choking, you can suck on hard candy or sore throat lozenges. GET HELP IF:  You have large, tender lumps on your neck.  You have a rash.  You cough up green, yellow-brown, or bloody spit. GET HELP RIGHT AWAY IF:   You have a stiff neck.  You drool or cannot swallow liquids.  You throw up (vomit) or are not able to keep medicine or liquids down.  You have very bad pain that does not go away with medicine.  You have problems breathing (not from a stuffy nose). MAKE SURE YOU:   Understand these instructions.  Will watch your condition.  Will get help right away if you are not doing well or get worse. Document Released: 08/10/2007 Document Revised: 12/12/2012 Document Reviewed: 10/29/2012 [facility] Patient Information 2015 [place], [place]. This information is not intended to replace advice given to you by your health care provider. Make sure you discuss any questions you have with your health care provider.

## 2014-02-25 LAB — CULTURE, GROUP A STREP

## 2014-02-28 ENCOUNTER — Encounter (HOSPITAL_COMMUNITY): Payer: Self-pay | Admitting: *Deleted

## 2014-02-28 ENCOUNTER — Emergency Department (HOSPITAL_COMMUNITY)
Admission: EM | Admit: 2014-02-28 | Discharge: 2014-02-28 | Disposition: A | Discharge status: Home / Self Care | Payer: Medicaid Other | Attending: Emergency Medicine | Admitting: Emergency Medicine

## 2014-02-28 DIAGNOSIS — M1990 Unspecified osteoarthritis, unspecified site: Secondary | ICD-10-CM | POA: Insufficient documentation

## 2014-02-28 DIAGNOSIS — K219 Gastro-esophageal reflux disease without esophagitis: Secondary | ICD-10-CM | POA: Diagnosis not present

## 2014-02-28 DIAGNOSIS — Z87891 Personal history of nicotine dependence: Secondary | ICD-10-CM | POA: Insufficient documentation

## 2014-02-28 DIAGNOSIS — I10 Essential (primary) hypertension: Secondary | ICD-10-CM | POA: Diagnosis not present

## 2014-02-28 DIAGNOSIS — J029 Acute pharyngitis, unspecified: Secondary | ICD-10-CM | POA: Diagnosis present

## 2014-02-28 DIAGNOSIS — Z8659 Personal history of other mental and behavioral disorders: Secondary | ICD-10-CM | POA: Diagnosis not present

## 2014-02-28 DIAGNOSIS — Z79899 Other long term (current) drug therapy: Secondary | ICD-10-CM | POA: Diagnosis not present

## 2014-02-28 DIAGNOSIS — Z87440 Personal history of urinary (tract) infections: Secondary | ICD-10-CM | POA: Diagnosis not present

## 2014-02-28 DIAGNOSIS — J069 Acute upper respiratory infection, unspecified: Secondary | ICD-10-CM | POA: Diagnosis not present

## 2014-02-28 DIAGNOSIS — Z880 Allergy status to penicillin: Secondary | ICD-10-CM | POA: Diagnosis not present

## 2014-02-28 DIAGNOSIS — Z7952 Long term (current) use of systemic steroids: Secondary | ICD-10-CM | POA: Diagnosis not present

## 2014-02-28 NOTE — Discharge Instructions (Signed)
°Emergency Department Resource Guide °1) Find a Doctor and Pay Out of Pocket °Although you won't have to find out who is covered by your insurance plan, it is a good idea to ask around and get recommendations. You will then need to call the office and see if the doctor you have chosen will accept you as a new patient and what types of options they offer for patients who are self-pay. Some doctors offer discounts or will set up payment plans for their patients who do not have insurance, but you will need to ask so you aren't surprised when you get to your appointment. ° °2) Contact Your Local Health Department °Not all health departments have doctors that can see patients for sick visits, but many do, so it is worth a call to see if yours does. If you don't know where your local health department is, you can check in your phone book. The CDC also has a tool to help you locate your state's health department, and many state websites also have listings of all of their local health departments. ° °3) Find a Walk-in Clinic °If your illness is not likely to be very severe or complicated, you may want to try a walk in clinic. These are popping up all over the country in pharmacies, drugstores, and shopping centers. They're usually staffed by nurse practitioners or physician assistants that have been trained to treat common illnesses and complaints. They're usually fairly quick and inexpensive. However, if you have serious medical issues or chronic medical problems, these are probably not your best option. ° °No Primary Care Doctor: °- Call [facility] Connect at  832-8000 - they can help you locate a primary care doctor that  accepts your insurance, provides certain services, etc. °- Physician Referral Service- 1-800-533-3463 ° °Chronic Pain Problems: °Organization         Address  Phone   Notes  °[facility]  (336) 297-2271 Patients need to be referred by their primary care doctor.  ° °Medication  Assistance: °Organization         Address  Phone   Notes  °[place] [facility] [address] °[place], [place] [address] (336) 641-8030 --Must be a resident of [place] °-- Must have NO insurance coverage whatsoever (no Medicaid/ Medicare, etc.) °-- The pt. MUST have a primary care doctor that directs their care regularly and follows them in the community °  °[facility]  (866) 331-1348   °[employer]  (888) 892-1162   ° °Agencies that provide inexpensive medical care: °Organization         Address  Phone   Notes  °[facility]  (336) 832-8035   °[facility]    (336) 832-7272   °[facility] [address] °[place], [place] [address] (336) 832-4777   °[facility] [address], °[place] (336) 271-4999   °[facility]    (336) 373-0678   °[facility]    (336) 272-1050   °[facility] ° [address], [place] Phone:  (336) 832-4444, Fax:  (336) 832-4440 Hours of Operation:  9 am - 6 pm, M-F.  Also accepts Medicaid/Medicare and self-pay.  °[facility] ° [address], [place] Phone: (336) 832-3150, Fax: (336) [phone]. Hours of Operation:  8:30 am - 5:30 pm, M-F.  Also accepts Medicaid and self-pay.  °[facility] [place] [address], [place] Phone: (336) 878-6027   °[facility] [address], [place], [place] (336)723-1848, Ext. 123 Mondays & Thursdays: 7-9 AM.  First 15 patients are seen on a first come, first serve basis. °  ° °Medicaid-accepting [place] Providers: ° °Organization         Address  Phone   Notes  °[facility] [address], [place] (336) 641-2100 Also accepts self-pay patients.  °[facility] [address], [place] ° (336) 856-9996   °[facility] [address], [place]  (336) 288-8857   °[facility] [address] [place] [address], [place] (336) 299-7000   °Veita Bland [address], [place]  ° (336) 373-1557 Only accepts [place] Access Medicaid patients after they have their name applied to their card.  ° °Self-Pay (no insurance) in [place]: ° °Organization         Address  Phone   Notes  °Sickle Cell Patients, [facility] [address], [place] (336) 832-1970   °[facility] [address], [place] (336) 832-4400   °[facility] ° [address], [place] (336) 992-4800   °[facility]/Dr. Osei-Bonsu ° [address] [place] [address], [place] or [address], [place] (336) 841-8500 Phone number for both [place] and [place] locations is the same.  °[facility] [address], [place] (336) 299-0000   °[facility] [address] [place] [address], [place] or [address] (336) 852-7530 °(336) 878-2260   °[facility] [address], [place] (336) 350-1642, phone; (336) 294-5005, fax Sees patients 1st and 3rd Saturday of every month.  Must not qualify for public or private insurance (i.e. Medicaid, Medicare, [place] Health Choice, Veterans' Benefits) • Household income should be no more than 200% of the poverty level •The clinic cannot treat you if you are pregnant or think you are pregnant • Sexually transmitted diseases are not treated at the clinic.  ° ° °Dental Care: °Organization         Address  Phone  Notes  °[place] [facility] [address], [place] (336) 641-6152 Accepts children up to age 21 who are enrolled in Medicaid or [place] Health Choice; pregnant women with a Medicaid card; and children who have applied for Medicaid or [place] Health Choice, but were declined, whose parents can pay a reduced fee at time of service.  °[place]  [facility] [place]  [address], [place] (336) 641-7733 Accepts children up to age 21 who are enrolled in Medicaid or [place] Health Choice; pregnant women with a Medicaid card; and children who have applied for Medicaid or [place] Health Choice, but were declined, whose parents can pay a reduced fee at time of service.  °[facility] ° [address],  (336) 641-4533 Patients are seen by appointment only. Walk-ins are not accepted. [facility] will see patients 18 years of age and older. °Monday - Tuesday (8am-5pm) °Most Wednesdays (8:30-5pm) °$30 per visit, cash only  °[facility] ° [address], [place] (336) 641-4533 Patients are seen by appointment only. Walk-ins are not accepted. [facility] will see patients 18 years of age and older. °One   Wednesday Evening (Monthly: Volunteer Based).  $30 per visit, cash only  °[facility]  (919) 537-3737 for adults; Children under age 4, call Graduate Pediatric Dentistry at ([phone]) [phone]. Children aged 4-14, please call ([phone]) [phone] to request a pediatric application. ° Dental services are provided in all areas of dental care including fillings, crowns and bridges, complete and partial dentures, implants, gum treatment, root canals, and extractions. Preventive care is also provided. Treatment is provided to both adults and children. °Patients are selected via a lottery and there is often a waiting list. °  °[facility] [address], °[place] ° (336) 763-8833 [email or web address].[email or web address] °  °[facility] [address], [place], [place] (336)723-1848, Ext. 123 Second and Fourth Thursday of each month, opens at 6:30 AM; Clinic ends at 9 AM.  Patients are seen on a first-come first-served basis, and a limited number are seen during each clinic.  ° °[facility] ° [address], [place], [place] (336) 723-7904    Eligibility Requirements °You must have lived in [place], [place], or [place] counties for at least the last three months. °  You cannot be eligible for state or federal sponsored healthcare insurance, including Veterans Administration, Medicaid, or Medicare. °  You generally cannot be eligible for healthcare insurance through your employer.  °  How to apply: °Eligibility screenings are held every Tuesday and Wednesday afternoon from 1:00 pm until 4:00 pm. You do not need an appointment for the interview!  °[facility] [address], [place], [place] 336-631-2330   °[facility]  336-342-8273   °[facility]  336-703-3100   °[facility]  336-570-6415   ° °Behavioral Health Resources in the Community: °Intensive Outpatient Programs °Organization         Address  Phone  Notes  °[facility] [address], [place], [place] 336-878-6098   °[facility] [address], [place], [place] 336-832-9800   °[facility] [address], [place], [place] ° 336-882-2125   °[facility] [address],  °[place], [place] 1-800-853-5163 or 336-641-4981   °Substance Abuse Resources °Organization         Address  Phone  Notes  °[facility]  336-882-2125   °[facility]  336-784-9470   °[facility]  336-285-9073   °[facility]  336-845-3988   °Residential & Outpatient Substance Abuse Program  1-800-659-3381   °Psychological Services °Organization         Address  Phone  Notes  °[facility]  336- 832-9600   °[facility]  336- 378-7881   °[facility] [address], [place] 1-800-853-5163 or 336-641-4981   ° °Mobile Crisis Teams °Organization         Address  Phone  Notes  °[facility], Mobile Crisis Care Unit  1-877-626-1772   °Assertive °[facility] ° [address]  [place], [place] 336-834-9664   °[facility] De[facility]sch [address] ° [place] 336-554-5454   ° °Self-Help/Support Groups °Organization         Address  Phone             Notes  °[facility]  - variety of support groups  336- 373-1402 Call for more information  °Narcotics Anonymous (NA), [facility] [address], °[place]  2 meetings at this location  ° °  Residential Treatment Programs Organization         Address  Phone  Notes  [facility] [address]    [place]  1-636-275-3754   [facility]  [address] [place] [address], [place], [place]   [place], [place] [phone] Admissions: 8am-3pm M-F  [facility] [place] [address]    [place], [place] 564-332-9518   [facility] [address] [place], [place], [place]   [facility] [address],  [place], [place]   [facility] Intensive Outpatient [address], [place], [place]   [facility] [place]  [place], [place] 1-(309)519-1730 or (351)460-8637   [facility] [address], [place], [place] Accepts Medicaid  [facility] [address],  [place] 1-919-717-9304 Substance Abuse/Addiction Treatment   [facility]         Address  Phone  Notes  [facility]  2505062029   Domenic Schwab, PhD [address] Arlis [place]a [place], [place]   (828) 629-9000 or [phone]   [place], [place] [phone]   [facility] [address] [place], [place] [phone] Insurance/Medicaid/sponsorship through [facility] [address]                                    [place], [place] ([phone] [place] [address], [place] [phone]    Dr. Adele Schilder  [phone]   [facility] Dept. 1) [address], [place] 2) [place] 3)  [address], [place] [phone]  [phone]   [place] 920-474-7822 or [phone] (After Hours)      Take over the counter tylenol and ibuprofen, as directed on packaging, as needed for discomfort.  Gargle with warm water several times per day to help with discomfort.  May also use over the counter sore throat pain medicines such as chloraseptic or sucrets, as directed on packaging, as needed for discomfort.  Take over the counter decongestant (such as sudafed), as directed on packaging, for the next week.  Use over the counter normal saline nasal spray, as instructed in the Emergency Department, several times per day for the next 2 weeks.  Call your regular medical doctor on Monday to schedule a follow up appointment next week.  Return to the Emergency Department immediately if worsening.

## 2014-02-28 NOTE — ED Notes (Signed)
Pt reports her throat is no better. Was seen in the ER a few days ago. Now hurting in her ears also.

## 2014-02-28 NOTE — ED Provider Notes (Signed)
CSN: [id number]     Arrival date & time 02/28/14  0630 History   First MD Initiated Contact with Patient 02/28/14 [phone]     Chief Complaint  Patient presents with  . Sore Throat      HPI Pt was seen at 0725.  Per pt, c/o gradual onset and persistence of constant sore throat, runny/stuffy nose, sinus congestion, ears congestion, bilateral ears "pain," and cough for the past 1 week.  Symptoms have been gradual over the past week. Similar symptoms in other household members. Denies fevers, no rash, no CP/SOB, no N/V/D, no abd pain.     Past Medical History  Diagnosis Date  . Hypertension   . Migraine   . Arthritis   . GERD (gastroesophageal reflux disease)   . Panic attack   . Anxiety   . Panic disorder   . UTI (lower urinary tract infection)   . Vertigo    Past Surgical History  Procedure Laterality Date  . Breast surgery    . Abdominal hysterectomy      partial   Family History  Problem Relation Age of Onset  . Hypertension Mother   . Diabetes Father    History  Substance Use Topics  . Smoking status: Former Smoker -- 2.00 packs/day for 30 years    Types: Cigarettes    Quit date: 04/01/2002  . Smokeless tobacco: Not on file  . Alcohol Use: No   OB History    Gravida Para Term Preterm AB TAB SAB Ectopic Multiple Living   4 3 1 2 1  1   3      Review of Systems ROS: Statement: All systems negative except as marked or noted in the HPI; Constitutional: Negative for fever and chills. ; ; Eyes: Negative for eye pain, redness and discharge. ; ; ENMT: +ear pain, nasal congestion, sinus pressure and sore throat. ; ; Cardiovascular: Negative for chest pain, palpitations, diaphoresis, dyspnea and peripheral edema. ; ; Respiratory: +cough. Negative for wheezing and stridor. ; ; Gastrointestinal: Negative for nausea, vomiting, diarrhea, abdominal pain, blood in stool, hematemesis, jaundice and rectal bleeding. . ; ; Genitourinary: Negative for dysuria, flank pain and hematuria. ;  ; Musculoskeletal: Negative for back pain and neck pain. Negative for swelling and trauma.; ; Skin: Negative for pruritus, rash, abrasions, blisters, bruising and skin lesion.; ; Neuro: Negative for headache, lightheadedness and neck stiffness. Negative for weakness, altered level of consciousness , altered mental status, extremity weakness, paresthesias, involuntary movement, seizure and syncope.      Allergies  Cephalexin; Ibuprofen; Amoxicillin; Bactrim; Ciprofloxacin; Imitrex; Morphine and related; Tetracyclines & related; and Zoloft  Home Medications   Prior to Admission medications   Medication Sig Start Date End Date Taking? Authorizing Provider  Cholecalciferol (VITAMIN D PO) Take 1 tablet by mouth daily.   Yes Historical Provider, MD  lisinopril (PRINIVIL,ZESTRIL) 5 MG tablet Take 2.5 mg by mouth every morning.    Yes Historical Provider, MD  meclizine (ANTIVERT) 50 MG tablet Take 1 tablet (50 mg total) by mouth 3 (three) times daily as needed for dizziness. 12/29/13  Yes [name], MD  ondansetron (ZOFRAN ODT) 4 MG disintegrating tablet 4mg  ODT q4 hours prn nausea/vomit 01/08/14  Yes [name], MD  predniSONE (DELTASONE) 10 MG tablet Take 2 tablets (20 mg total) by mouth 2 (two) times daily. 12/05/13  Yes [name], MD  promethazine (PHENERGAN) 12.5 MG tablet Take 1 tablet (12.5 mg total) by mouth every 6 (six) hours as needed for  nausea. 07/[birth year]/59  Yes Delora Fuel, MD  propranolol (INDERAL) 10 MG tablet Take 10 mg by mouth at bedtime.     Yes Historical Provider, MD  RABEprazole (ACIPHEX) 20 MG tablet Take 20 mg by mouth every morning.    Yes Historical Provider, MD  valACYclovir (VALTREX) 1000 MG tablet Take 1 tablet (1,000 mg total) by mouth 3 (three) times daily. 12/05/13  Yes [name], MD   BP 143/64 mmHg  Pulse 81  Temp(Src) 97.8 F (36.6 C) (Oral)  Resp 15  Ht 5\' 8"  (1.727 m)  Wt 200 lb (90.719 kg)  BMI 30.42 kg/m2  SpO2 99% Physical Exam  0730: Physical  examination:  Nursing notes reviewed; Vital signs and O2 SAT reviewed;  Constitutional: Well developed, Well nourished, Well hydrated, In no acute distress; Head:  Normocephalic, atraumatic; Eyes: EOMI, PERRL, No scleral icterus; ENMT: +clear fluid behind TM's bilat. +edemetous nasal turbinates bilat with clear rhinorrhea. +visualized post-nasal drip in posterior pharynx. Mouth and pharynx without lesions. No tonsillar exudates. No intra-oral edema. No submandibular or sublingual edema. No hoarse voice, no drooling, no stridor. No soft palate or post pharyngeal bulging. No pain with manipulation of larynx. No trismus. Mouth and pharynx normal, Mucous membranes moist; Neck: Supple, Full range of motion, No lymphadenopathy; Cardiovascular: Regular rate and rhythm, No murmur, rub, or gallop; Respiratory: Breath sounds clear & equal bilaterally, No rales, rhonchi, wheezes.  Speaking full sentences with ease, Normal respiratory effort/excursion; Chest: Nontender, Movement normal; Abdomen: Soft, Nontender, Nondistended, Normal bowel sounds; Genitourinary: No CVA tenderness; Extremities: Pulses normal, No tenderness, No edema, No calf edema or asymmetry.; Neuro: AA&Ox3, Major CN grossly intact.  Speech clear. No gross focal motor or sensory deficits in extremities. Climbs on and off stretcher easily by herself. Gait steady.; Skin: Color normal, Warm, Dry.   ED Course  Procedures     EKG Interpretation None      MDM  MDM Reviewed: previous chart, nursing note and vitals Reviewed previous: labs      0735:  02/22/14 rapid strep and throat culture were negative.  Offered recheck of throat swab and CXR; pt refuses.  Modified [facility] criteria -1; doubt strep. Tx symptomatically at this time. Dx and previous testing d/w pt and family.  Questions answered.  Verb understanding, agreeable to d/c home with outpt f/u.   Francine Graven, DO 03/03/14 1105

## 2014-02-28 NOTE — ED Notes (Signed)
EDP in with pt 

## 2014-04-09 ENCOUNTER — Encounter (HOSPITAL_COMMUNITY): Payer: Self-pay | Admitting: *Deleted

## 2014-04-09 ENCOUNTER — Emergency Department (HOSPITAL_COMMUNITY)
Admission: EM | Admit: 2014-04-09 | Discharge: 2014-04-09 | Disposition: A | Discharge status: Home / Self Care | Payer: Medicaid Other | Attending: Emergency Medicine | Admitting: Emergency Medicine

## 2014-04-09 DIAGNOSIS — I10 Essential (primary) hypertension: Secondary | ICD-10-CM | POA: Diagnosis not present

## 2014-04-09 DIAGNOSIS — G40909 Epilepsy, unspecified, not intractable, without status epilepticus: Secondary | ICD-10-CM | POA: Diagnosis not present

## 2014-04-09 DIAGNOSIS — Z87440 Personal history of urinary (tract) infections: Secondary | ICD-10-CM | POA: Insufficient documentation

## 2014-04-09 DIAGNOSIS — Z880 Allergy status to penicillin: Secondary | ICD-10-CM | POA: Insufficient documentation

## 2014-04-09 DIAGNOSIS — Z87891 Personal history of nicotine dependence: Secondary | ICD-10-CM | POA: Insufficient documentation

## 2014-04-09 DIAGNOSIS — M1990 Unspecified osteoarthritis, unspecified site: Secondary | ICD-10-CM | POA: Insufficient documentation

## 2014-04-09 DIAGNOSIS — H9209 Otalgia, unspecified ear: Secondary | ICD-10-CM | POA: Insufficient documentation

## 2014-04-09 DIAGNOSIS — K219 Gastro-esophageal reflux disease without esophagitis: Secondary | ICD-10-CM | POA: Diagnosis not present

## 2014-04-09 DIAGNOSIS — R42 Dizziness and giddiness: Secondary | ICD-10-CM | POA: Diagnosis not present

## 2014-04-09 DIAGNOSIS — Z79899 Other long term (current) drug therapy: Secondary | ICD-10-CM | POA: Insufficient documentation

## 2014-04-09 DIAGNOSIS — Z8659 Personal history of other mental and behavioral disorders: Secondary | ICD-10-CM | POA: Diagnosis not present

## 2014-04-09 MED ORDER — MECLIZINE HCL 25 MG PO TABS: 25.0000 mg | ORAL_TABLET | Freq: Four times a day (QID) | ORAL | Status: DC

## 2014-04-09 MED ORDER — LISINOPRIL 5 MG PO TABS: 2.5000 mg | ORAL_TABLET | Freq: Every day | ORAL | Status: AC

## 2014-04-09 NOTE — ED Provider Notes (Signed)
CSN: [id number]     Arrival date & time 04/09/14  1942 History  This chart was scribed for Sharyon Cable, MD by Edison Simon, ED Scribe. This patient was seen in room APA06/APA06 and the patient's care was started at 11:08 PM.    Chief Complaint  Patient presents with  . Dizziness   Patient is a 64 y.o. female presenting with dizziness. The history is provided by the patient. No language interpreter was used.  Dizziness Quality:  Head spinning Severity:  Moderate Onset quality:  Sudden Duration:  1 day Timing:  Intermittent Progression:  Partially resolved Chronicity:  Recurrent Context comment:  Sitting down Relieved by:  Medication Worsened by:  Movement Ineffective treatments:  None tried Associated symptoms: no chest pain, no headaches, no shortness of breath and no vomiting   Risk factors: hx of vertigo   Risk factors: no hx of stroke     HPI Comments: Emily Cooke is a 64 y.o. female who presents to the Emergency Department complaining of sudden onset, intermittent, head-spinning dizziness with onset this evening while sitting down. She reports  with associated fullness and pain to bilateral ears. She states she often has inner ear infections and vertigo. She states she used Antivert at 1800 with improvement, which she has been prescribed for vertigo. She denies prior CVA and states she has been evaluated multiple times for dizziness. She states she was been on 2.5mg  Lisinopril and Propanolol for many years; her PCP took her off Lisinopril and increased her Propanolol dosage. She states she has been using less Proponolol than she was instructed. She states she is currently trying to find a new PCP. She denies fever, vomiting, headache, vision loss, double vision, chest pain, SOB, abdominal pain, weakness in arms or legs, slurred speech, or syncope.  Past Medical History  Diagnosis Date  . Hypertension   . Migraine   . Arthritis   . GERD (gastroesophageal reflux disease)    . Panic attack   . Anxiety   . Panic disorder   . UTI (lower urinary tract infection)   . Vertigo    Past Surgical History  Procedure Laterality Date  . Breast surgery    . Abdominal hysterectomy      partial   Family History  Problem Relation Age of Onset  . Hypertension Mother   . Diabetes Father    History  Substance Use Topics  . Smoking status: Former Smoker -- 2.00 packs/day for 30 years    Types: Cigarettes    Quit date: 04/01/2002  . Smokeless tobacco: Not on file  . Alcohol Use: No   OB History    Gravida Para Term Preterm AB TAB SAB Ectopic Multiple Living   4 3 1 2 1  1   3      Review of Systems  Constitutional: Negative for fever.  HENT: Positive for ear pain.   Eyes: Negative for visual disturbance.  Respiratory: Negative for shortness of breath.   Cardiovascular: Negative for chest pain.  Gastrointestinal: Negative for vomiting and abdominal pain.  Neurological: Positive for dizziness. Negative for syncope, speech difficulty, weakness and headaches.  All other systems reviewed and are negative.     Allergies  Cephalexin; Ibuprofen; Amoxicillin; Bactrim; Ciprofloxacin; Imitrex; Morphine and related; Tetracyclines & related; and Zoloft  Home Medications   Prior to Admission medications   Medication Sig Start Date End Date Taking? Authorizing Provider  Cholecalciferol (VITAMIN D PO) Take 1 tablet by mouth daily.  Historical Provider, MD  lisinopril (PRINIVIL,ZESTRIL) 5 MG tablet Take 0.5 tablets (2.5 mg total) by mouth daily. 04/09/14   Sharyon Cable, MD  meclizine (ANTIVERT) 25 MG tablet Take 1 tablet (25 mg total) by mouth 4 (four) times daily. 04/09/14   Sharyon Cable, MD  promethazine (PHENERGAN) 12.5 MG tablet Take 1 tablet (12.5 mg total) by mouth every 6 (six) hours as needed for nausea. 6/76/72   Delora Fuel, MD  propranolol (INDERAL) 10 MG tablet Take 10 mg by mouth at bedtime.      Historical Provider, MD  RABEprazole (ACIPHEX) 20 MG  tablet Take 20 mg by mouth every morning.     Historical Provider, MD  valACYclovir (VALTREX) 1000 MG tablet Take 1 tablet (1,000 mg total) by mouth 3 (three) times daily. 12/05/13   [name], MD   BP 146/63 mmHg  Pulse 68  Temp(Src) 98.9 F (37.2 C) (Oral)  Resp 18  Ht 5\' 8"  (1.727 m)  Wt 200 lb (90.719 kg)  BMI 30.42 kg/m2  SpO2 100% Physical Exam  Nursing note and vitals reviewed.  CONSTITUTIONAL: Well developed/well nourished HEAD: Normocephalic/atraumatic EYES: EOMI/PERRL, no nystagmus, no ptosis ENMT: Mucous membranes moist, TMs intact bilaterally, no evidence of otitis media NECK: supple no meningeal signs, no bruits CV: S1/S2 noted, no murmurs/rubs/gallops noted LUNGS: Lungs are clear to auscultation bilaterally, no apparent distress ABDOMEN: soft, nontender, no rebound or guarding GU:no cva tenderness NEURO:Awake/alert, facies symmetric, no arm or leg drift is noted Equal 5/5 strength with shoulder abduction, elbow flex/extension, wrist flex/extension in upper extremities and equal hand grips bilaterally Equal 5/5 strength with hip flexion,knee flex/extension, foot dorsi/plantar flexion Cranial nerves 3/4/5/6/09/12/08/11/12 tested and intact Gait normal without ataxia No past pointing Sensation to light touch intact in all extremities EXTREMITIES: pulses normal, full ROM SKIN: warm, color normal PSYCH: no abnormalities of mood noted    ED Course  Procedures   DIAGNOSTIC STUDIES: Oxygen Saturation is 100% on room air, normal by my interpretation.    COORDINATION OF CARE: 11:17 PM Discussed treatment plan with patient at beside, including prescription for Antivert and discharge. The patient agrees with the plan and has no further questions at this time.  Pt already improved by the time of my evaluation She is ambulatory without any neuro deficits She is requesting d/c home I doubt acute CVA given history/physical She requests refill for antivert and  lisinopril  MDM   Final diagnoses:  Vertigo    Nursing notes including past medical history and social history reviewed and considered in documentation   I personally performed the services described in this documentation, which was scribed in my presence. The recorded information has been reviewed and is accurate.      Sharyon Cable, MD 04/10/14 [phone]

## 2014-04-09 NOTE — Discharge Instructions (Signed)

## 2014-04-09 NOTE — ED Notes (Signed)
Pt denies any dizziness at present. C/o bilateral ear "fullness" and feeling as though "fluid" is in the ear. Nad noted. Pt alert and orientated.

## 2014-04-09 NOTE — ED Notes (Addendum)
Pt reporting dizziness starting this evening.  States that she feels right ear is full of fluid.  Reports some minor relief from Antivert taken about 6 pm.  Reports some ringing in right ear as well. Pt also reports that she has been trying to adjust her BP medications.

## 2014-04-09 NOTE — ED Notes (Signed)
MD at bedside. 

## 2014-04-25 ENCOUNTER — Emergency Department (HOSPITAL_COMMUNITY): Payer: MEDICAID

## 2014-04-25 ENCOUNTER — Emergency Department (HOSPITAL_COMMUNITY)
Admission: EM | Admit: 2014-04-25 | Discharge: 2014-04-25 | Disposition: A | Discharge status: Home / Self Care | Payer: MEDICAID | Attending: Emergency Medicine | Admitting: Emergency Medicine

## 2014-04-25 ENCOUNTER — Encounter (HOSPITAL_COMMUNITY): Payer: Self-pay | Admitting: *Deleted

## 2014-04-25 DIAGNOSIS — F410 Panic disorder [episodic paroxysmal anxiety] without agoraphobia: Secondary | ICD-10-CM | POA: Insufficient documentation

## 2014-04-25 DIAGNOSIS — M1990 Unspecified osteoarthritis, unspecified site: Secondary | ICD-10-CM | POA: Insufficient documentation

## 2014-04-25 DIAGNOSIS — Z87891 Personal history of nicotine dependence: Secondary | ICD-10-CM | POA: Diagnosis not present

## 2014-04-25 DIAGNOSIS — Z87440 Personal history of urinary (tract) infections: Secondary | ICD-10-CM | POA: Diagnosis not present

## 2014-04-25 DIAGNOSIS — F419 Anxiety disorder, unspecified: Secondary | ICD-10-CM

## 2014-04-25 DIAGNOSIS — K219 Gastro-esophageal reflux disease without esophagitis: Secondary | ICD-10-CM | POA: Insufficient documentation

## 2014-04-25 DIAGNOSIS — R002 Palpitations: Secondary | ICD-10-CM | POA: Diagnosis present

## 2014-04-25 DIAGNOSIS — Z79899 Other long term (current) drug therapy: Secondary | ICD-10-CM | POA: Insufficient documentation

## 2014-04-25 DIAGNOSIS — R079 Chest pain, unspecified: Secondary | ICD-10-CM | POA: Diagnosis not present

## 2014-04-25 DIAGNOSIS — I10 Essential (primary) hypertension: Secondary | ICD-10-CM | POA: Diagnosis not present

## 2014-04-25 DIAGNOSIS — Z880 Allergy status to penicillin: Secondary | ICD-10-CM | POA: Insufficient documentation

## 2014-04-25 LAB — COMPREHENSIVE METABOLIC PANEL
ALT: 19 U/L (ref 0–35)
ANION GAP: 4 — AB (ref 5–15)
AST: 25 U/L (ref 0–37)
Albumin: 4.3 g/dL (ref 3.5–5.2)
Alkaline Phosphatase: 78 U/L (ref 39–117)
BUN: 10 mg/dL (ref 6–23)
CALCIUM: 8.6 mg/dL (ref 8.4–10.5)
CO2: 27 mmol/L (ref 19–32)
CREATININE: 0.63 mg/dL (ref 0.50–1.10)
Chloride: 107 mmol/L (ref 96–112)
GFR calc Af Amer: >90 mL/min (ref >90)
Glucose, Bld: 93 mg/dL (ref 70–99)
Potassium: 3.6 mmol/L (ref 3.5–5.1)
Sodium: 138 mmol/L (ref 135–145)
Total Bilirubin: 0.5 mg/dL (ref 0.3–1.2)
Total Protein: 7.2 g/dL (ref 6.0–8.3)

## 2014-04-25 LAB — CBC WITH DIFFERENTIAL/PLATELET
BASOS ABS: 0 10*3/uL (ref 0.0–0.1)
Basophils Relative: 1 % (ref 0–1)
Eosinophils Absolute: 0.1 10*3/uL (ref 0.0–0.7)
Eosinophils Relative: 2 % (ref 0–5)
HCT: 38.8 % (ref 36.0–46.0)
Hemoglobin: 12.8 g/dL (ref 12.0–15.0)
LYMPHS PCT: 37 % (ref 12–46)
Lymphs Abs: 2.4 10*3/uL (ref 0.7–4.0)
MCH: 30.1 pg (ref 26.0–34.0)
MCHC: 33 g/dL (ref 30.0–36.0)
MCV: 91.3 fL (ref 78.0–100.0)
Monocytes Absolute: 0.4 10*3/uL (ref 0.1–1.0)
Monocytes Relative: 6 % (ref 3–12)
NEUTROS ABS: 3.6 10*3/uL (ref 1.7–7.7)
NEUTROS PCT: 54 % (ref 43–77)
PLATELETS: 214 10*3/uL (ref 150–400)
RBC: 4.25 MIL/uL (ref 3.87–5.11)
RDW: 12.6 % (ref 11.5–15.5)
WBC: 6.6 10*3/uL (ref 4.0–10.5)

## 2014-04-25 LAB — TROPONIN I: Troponin I: <0.03 ng/mL (ref <0.031)

## 2014-04-25 NOTE — ED Provider Notes (Signed)
CSN: [id number]     Arrival date & time 04/25/14  1642 History  This chart was scribed for Emily Diego, MD by Tula Nakayama, ED Scribe. This patient was seen in room APA09/APA09 and the patient's care was started at 6:04 PM.    No chief complaint on file.  Patient is a 64 y.o. female presenting with palpitations. The history is provided by the patient. No language interpreter was used.  Palpitations Onset quality:  Sudden Timing:  Intermittent Progression:  Unable to specify Chronicity:  New Context: anxiety   Relieved by:  Nothing Worsened by:  Nothing Associated symptoms: chest pain   Associated symptoms: no back pain, no cough and no shortness of breath     HPI Comments: Emily Cooke is a 64 y.o. female who presents to the Emergency Department complaining of intermittent heart palpitations. She notes increased belching and mild chest pain as associated symptom. Pt thinks that current symptoms are due to anxiety. She regularly takes hydroxyzine for anxiety, but has not taken treatment today. Pt also takes Propranolol and Lisinopril on a regular basis.  Past Medical History  Diagnosis Date  . Hypertension   . Migraine   . Arthritis   . GERD (gastroesophageal reflux disease)   . Panic attack   . Anxiety   . Panic disorder   . UTI (lower urinary tract infection)   . Vertigo    Past Surgical History  Procedure Laterality Date  . Breast surgery    . Abdominal hysterectomy      partial   Family History  Problem Relation Age of Onset  . Hypertension Mother   . Diabetes Father    History  Substance Use Topics  . Smoking status: Former Smoker -- 2.00 packs/day for 30 years    Types: Cigarettes    Quit date: 04/01/2002  . Smokeless tobacco: Not on file  . Alcohol Use: No   OB History    Gravida Para Term Preterm AB TAB SAB Ectopic Multiple Living   4 3 1 2 1  1   3      Review of Systems  Constitutional: Negative for appetite change and fatigue.  HENT:  Negative for congestion, ear discharge and sinus pressure.   Eyes: Negative for discharge.  Respiratory: Negative for cough and shortness of breath.   Cardiovascular: Positive for chest pain and palpitations.  Gastrointestinal: Negative for abdominal pain and diarrhea.  Genitourinary: Negative for frequency and hematuria.  Musculoskeletal: Negative for back pain.  Skin: Negative for rash.  Neurological: Negative for seizures and headaches.  Psychiatric/Behavioral: Negative for hallucinations.  All other systems reviewed and are negative.  Allergies  Cephalexin; Ibuprofen; Amoxicillin; Bactrim; Ciprofloxacin; Imitrex; Morphine and related; Tetracyclines & related; and Zoloft  Home Medications   Prior to Admission medications   Medication Sig Start Date End Date Taking? Authorizing Provider  Cholecalciferol (VITAMIN D PO) Take 1 tablet by mouth daily.    Historical Provider, MD  lisinopril (PRINIVIL,ZESTRIL) 5 MG tablet Take 0.5 tablets (2.5 mg total) by mouth daily. 04/09/14   Sharyon Cable, MD  meclizine (ANTIVERT) 25 MG tablet Take 1 tablet (25 mg total) by mouth 4 (four) times daily. 04/09/14   Sharyon Cable, MD  promethazine (PHENERGAN) 12.5 MG tablet Take 1 tablet (12.5 mg total) by mouth every 6 (six) hours as needed for nausea. 5/62/56   Delora Fuel, MD  propranolol (INDERAL) 10 MG tablet Take 10 mg by mouth at bedtime.  Historical Provider, MD  RABEprazole (ACIPHEX) 20 MG tablet Take 20 mg by mouth every morning.     Historical Provider, MD  valACYclovir (VALTREX) 1000 MG tablet Take 1 tablet (1,000 mg total) by mouth 3 (three) times daily. 12/05/13   [name], MD   BP 139/69 mmHg  Pulse 77  Temp(Src) 98 F (36.7 C) (Oral)  Resp 20  Ht 5\' 8"  (1.727 m)  Wt 200 lb (90.719 kg)  BMI 30.42 kg/m2  SpO2 97% Physical Exam  Constitutional: She is oriented to person, place, and time. She appears well-developed.  HENT:  Head: Normocephalic.  Eyes: Conjunctivae and EOM  are normal. No scleral icterus.  Neck: Neck supple. No thyromegaly present.  Cardiovascular: Normal rate and regular rhythm.  Exam reveals no gallop and no friction rub.   No murmur heard. Pulmonary/Chest: No stridor. She has no wheezes. She has no rales. She exhibits no tenderness.  Abdominal: She exhibits no distension. There is no tenderness. There is no rebound.  Musculoskeletal: Normal range of motion. She exhibits no edema.  Lymphadenopathy:    She has no cervical adenopathy.  Neurological: She is oriented to person, place, and time. She exhibits normal muscle tone. Coordination normal.  Skin: No rash noted. No erythema.  Psychiatric: She has a normal mood and affect. Her behavior is normal.  Nursing note and vitals reviewed.   ED Course  Procedures (including critical care time) DIAGNOSTIC STUDIES: Oxygen Saturation is 97% on RA, normal by my interpretation.    COORDINATION OF CARE: 6:11 PM Discussed treatment plan with pt at bedside and pt agreed to plan.  Labs Review Labs Reviewed  CBC WITH DIFFERENTIAL/PLATELET  COMPREHENSIVE METABOLIC PANEL  TROPONIN I    Imaging Review Dg Chest 2 View  04/25/2014   CLINICAL DATA:  Acute mid chest pain, weakness and dizziness. Palpitations.  EXAM: CHEST  2 VIEW  COMPARISON:  10/25/2013  FINDINGS: Stable mild hyperinflation. Normal heart size and vascularity. No focal pneumonia, collapse or consolidation. No edema, effusion or pneumothorax. Artifact over the upper lobes bilaterally. Degenerative changes of the mid thoracic spine with anterior endplate osteophytes. No significant interval change.  IMPRESSION: Stable hyperinflation.  No superimposed acute process.   Electronically Signed   By: Jerilynn Mages.  Shick M.D.   On: 04/25/2014 17:33     EKG Interpretation   Date/Time:  Friday April 25 2014 16:50:17 EST Ventricular Rate:  76 PR Interval:  160 QRS Duration: 72 QT Interval:  384 QTC Calculation: 432 R Axis:   18 Text Interpretation:   Normal sinus rhythm Normal ECG Confirmed by POLLINA   MD, CHRISTOPHER [phone]) on 04/25/2014 4:56:47 PM      MDM   Final diagnoses:  None   Palpitations and anxiety,  Improved with vistaril,   The chart was scribed for me under my direct supervision.  I personally performed the history, physical, and medical decision making and all procedures in the evaluation of this patient.Emily Diego, MD 04/25/14 [phone]

## 2014-04-25 NOTE — Discharge Instructions (Signed)
Take your medicine as needed.  Follow up with your md if not improving

## 2014-04-25 NOTE — ED Notes (Signed)
Feeling palpitations, "just got out of a bad relationship, it might be that"  Chest hurts. No sob.feels weak , dizzy,

## 2014-04-25 NOTE — ED Notes (Signed)
Pt reports relief of symptoms after taking home med for anxiety. Pt in room talking with family member.

## 2014-04-25 NOTE — ED Notes (Signed)
Pain lt side of head also

## 2014-06-15 ENCOUNTER — Encounter (HOSPITAL_COMMUNITY): Payer: Self-pay | Admitting: Emergency Medicine

## 2014-06-15 ENCOUNTER — Emergency Department (HOSPITAL_COMMUNITY)
Admission: EM | Admit: 2014-06-15 | Discharge: 2014-06-15 | Disposition: A | Discharge status: Home / Self Care | Payer: Medicaid Other | Attending: Emergency Medicine | Admitting: Emergency Medicine

## 2014-06-15 DIAGNOSIS — H9201 Otalgia, right ear: Secondary | ICD-10-CM | POA: Diagnosis not present

## 2014-06-15 DIAGNOSIS — Z79899 Other long term (current) drug therapy: Secondary | ICD-10-CM | POA: Insufficient documentation

## 2014-06-15 DIAGNOSIS — K219 Gastro-esophageal reflux disease without esophagitis: Secondary | ICD-10-CM | POA: Insufficient documentation

## 2014-06-15 DIAGNOSIS — J069 Acute upper respiratory infection, unspecified: Secondary | ICD-10-CM

## 2014-06-15 DIAGNOSIS — J029 Acute pharyngitis, unspecified: Secondary | ICD-10-CM | POA: Diagnosis present

## 2014-06-15 DIAGNOSIS — M791 Myalgia: Secondary | ICD-10-CM | POA: Diagnosis not present

## 2014-06-15 DIAGNOSIS — I10 Essential (primary) hypertension: Secondary | ICD-10-CM | POA: Insufficient documentation

## 2014-06-15 DIAGNOSIS — Z87440 Personal history of urinary (tract) infections: Secondary | ICD-10-CM | POA: Diagnosis not present

## 2014-06-15 DIAGNOSIS — Z8659 Personal history of other mental and behavioral disorders: Secondary | ICD-10-CM | POA: Diagnosis not present

## 2014-06-15 DIAGNOSIS — Z880 Allergy status to penicillin: Secondary | ICD-10-CM | POA: Insufficient documentation

## 2014-06-15 DIAGNOSIS — Z87891 Personal history of nicotine dependence: Secondary | ICD-10-CM | POA: Insufficient documentation

## 2014-06-15 MED ORDER — AZITHROMYCIN 250 MG PO TABS: ORAL_TABLET | ORAL | Status: DC

## 2014-06-15 MED ORDER — AZITHROMYCIN 250 MG PO TABS
500.0000 mg | ORAL_TABLET | Freq: Once | ORAL | Status: AC
  Administered 2014-06-15: 500 mg via ORAL
  Filled 2014-06-15: qty 2

## 2014-06-15 MED ORDER — ACETAMINOPHEN 325 MG PO TABS
650.0000 mg | ORAL_TABLET | Freq: Once | ORAL | Status: AC
  Administered 2014-06-15: 650 mg via ORAL
  Filled 2014-06-15: qty 2

## 2014-06-15 NOTE — Discharge Instructions (Signed)
Saltwater gargles maybe helpful. Please increase fluids. Please wash hands frequently. Please use your mask until symptoms have resolved. Zithromax daily with food starting tomorrow April 11, until all taken. Please use Tylenol every 4 hours for aching and for fever. Please see your primary physician next week for recheck. Pharyngitis Pharyngitis is a sore throat (pharynx). There is redness, pain, and swelling of your throat. HOME CARE   Drink enough fluids to keep your pee (urine) clear or pale yellow.  Only take medicine as told by your doctor.  You may get sick again if you do not take medicine as told. Finish your medicines, even if you start to feel better.  Do not take aspirin.  Rest.  Rinse your mouth (gargle) with salt water ( tsp of salt per 1 qt of water) every 1-2 hours. This will help the pain.  If you are not at risk for choking, you can suck on hard candy or sore throat lozenges. GET HELP IF:  You have large, tender lumps on your neck.  You have a rash.  You cough up green, yellow-brown, or bloody spit. GET HELP RIGHT AWAY IF:   You have a stiff neck.  You drool or cannot swallow liquids.  You throw up (vomit) or are not able to keep medicine or liquids down.  You have very bad pain that does not go away with medicine.  You have problems breathing (not from a stuffy nose). MAKE SURE YOU:   Understand these instructions.  Will watch your condition.  Will get help right away if you are not doing well or get worse. Document Released: 08/10/2007 Document Revised: 12/12/2012 Document Reviewed: 10/29/2012 [facility] Patient Information 2015 [place], [place]. This information is not intended to replace advice given to you by your health care provider. Make sure you discuss any questions you have with your health care provider.

## 2014-06-15 NOTE — ED Provider Notes (Signed)
CSN: [id number]     Arrival date & time 06/15/14  0945 History   First MD Initiated Contact with Patient 06/15/14 [phone]     Chief Complaint  Patient presents with  . Sore Throat  . Otalgia     (Consider location/radiation/quality/duration/timing/severity/associated sxs/prior Treatment) Patient is a 64 y.o. female presenting with pharyngitis and ear pain. The history is provided by the patient.  Sore Throat This is a new problem. The current episode started in the past 7 days. The problem occurs intermittently. The problem has been gradually worsening. Associated symptoms include coughing, myalgias and a sore throat. Pertinent negatives include no diaphoresis, nausea, rash or vomiting. Associated symptoms comments: Hoarseness Ear pain. The symptoms are aggravated by swallowing. She has tried nothing for the symptoms. The treatment provided no relief.  Otalgia Associated symptoms: cough and sore throat   Associated symptoms: no rash and no vomiting     Past Medical History  Diagnosis Date  . Hypertension   . Migraine   . Arthritis   . GERD (gastroesophageal reflux disease)   . Panic attack   . Anxiety   . Panic disorder   . UTI (lower urinary tract infection)   . Vertigo    Past Surgical History  Procedure Laterality Date  . Breast surgery    . Abdominal hysterectomy      partial   Family History  Problem Relation Age of Onset  . Hypertension Mother   . Diabetes Father    History  Substance Use Topics  . Smoking status: Former Smoker -- 2.00 packs/day for 30 years    Types: Cigarettes    Quit date: 04/01/2002  . Smokeless tobacco: Not on file  . Alcohol Use: No   OB History    Gravida Para Term Preterm AB TAB SAB Ectopic Multiple Living   4 3 1 2 1  1   3      Review of Systems  Constitutional: Negative for diaphoresis.  HENT: Positive for ear pain and sore throat.   Respiratory: Positive for cough. Negative for wheezing and stridor.   Cardiovascular: Negative  for palpitations and leg swelling.  Gastrointestinal: Negative for nausea and vomiting.  Endocrine: Negative.   Genitourinary: Negative.   Musculoskeletal: Positive for myalgias.  Skin: Negative for rash.  Neurological: Negative.   Hematological: Negative.   Psychiatric/Behavioral: Negative.       Allergies  Cephalexin; Ibuprofen; Amoxicillin; Bactrim; Ciprofloxacin; Imitrex; Morphine and related; Tetracyclines & related; and Zoloft  Home Medications   Prior to Admission medications   Medication Sig Start Date End Date Taking? Authorizing Provider  Cholecalciferol (VITAMIN D PO) Take 1 tablet by mouth daily.    Historical Provider, MD  lisinopril (PRINIVIL,ZESTRIL) 5 MG tablet Take 0.5 tablets (2.5 mg total) by mouth daily. 04/09/14   [name], MD  meclizine (ANTIVERT) 25 MG tablet Take 1 tablet (25 mg total) by mouth 4 (four) times daily. 04/09/14   [name], MD  promethazine (PHENERGAN) 12.5 MG tablet Take 1 tablet (12.5 mg total) by mouth every 6 (six) hours as needed for nausea. 03/25/12   Delora Fuel, MD  propranolol (INDERAL) 10 MG tablet Take 10 mg by mouth at bedtime.      Historical Provider, MD  RABEprazole (ACIPHEX) 20 MG tablet Take 20 mg by mouth every morning.     Historical Provider, MD  valACYclovir (VALTREX) 1000 MG tablet Take 1 tablet (1,000 mg total) by mouth 3 (three) times daily. 12/05/13   [name], MD  BP 144/74 mmHg  Pulse 86  Temp(Src) 98 F (36.7 C) (Oral)  Resp 16  Ht 5\' 8"  (1.727 m)  Wt 220 lb (99.791 kg)  BMI 33.46 kg/m2  SpO2 98% Physical Exam  Constitutional: She is oriented to person, place, and time. She appears well-developed and well-nourished.  Non-toxic appearance.  HENT:  Head: Normocephalic.  Right Ear: There is tenderness. Tympanic membrane is injected. A middle ear effusion is present.  Left Ear: Tympanic membrane and external ear normal.  Mouth/Throat: No trismus in the jaw. Uvula swelling present. Posterior  oropharyngeal erythema present. No oropharyngeal exudate or tonsillar abscesses.  Eyes: EOM and lids are normal. Pupils are equal, round, and reactive to light.  Neck: Normal range of motion. Neck supple. Carotid bruit is not present.  Cardiovascular: Normal rate, regular rhythm, normal heart sounds, intact distal pulses and normal pulses.   Pulmonary/Chest: Breath sounds normal. No respiratory distress.  Abdominal: Soft. Bowel sounds are normal. There is no tenderness. There is no guarding.  Musculoskeletal: Normal range of motion.  Lymphadenopathy:       Head (right side): No submandibular adenopathy present.       Head (left side): No submandibular adenopathy present.    She has no cervical adenopathy.  Neurological: She is alert and oriented to person, place, and time. She has normal strength. No cranial nerve deficit or sensory deficit.  Skin: Skin is warm and dry.  Psychiatric: She has a normal mood and affect. Her speech is normal.  Nursing note and vitals reviewed.   ED Course  Procedures (including critical care time) Labs Review Labs Reviewed - No data to display  Imaging Review No results found.   EKG Interpretation None      MDM  Examination suggest pharyngitis and upper respiratory infection. The patient will be treated with Tylenol extra strength and Zithromax. The patient is to follow with her primary physician next week. I've asked the patient to increase fluids, wash hands frequently.    Final diagnoses:  None    *I have reviewed nursing notes, vital signs, and all appropriate lab and imaging results for this patient.[address] PA-C 06/15/14 1038  Milton Ferguson, MD 06/15/14 [phone]

## 2014-06-15 NOTE — ED Notes (Addendum)
Pt presents to ED complaining of severe throat and ear pain rated as 10/10 and described as throbbing and aching.  She has had difficulty swallowing and is developing hoarseness.  Her hearing is described as muffled but she denies any discharge from her ears.  She has been coughing but it is nonproductive and she states that it feels like a dry cough due to a scratchy sensation in her throat.  She has not taken any medications to treat her current symptoms.  She states that she has felt chilled but has not taken her temperature at home.  She has had generalized muscle aches but no sweating.  Denies nausea, vomiting, diarrhea, and urinary symptoms.  Chest expansion symmetrical, lung sounds clear bilaterally. Dry cough noted on deep inhalation. No redness noted upon visualization of inner ear using otoscope.

## 2014-08-24 ENCOUNTER — Emergency Department (HOSPITAL_COMMUNITY)
Admission: EM | Admit: 2014-08-24 | Discharge: 2014-08-24 | Disposition: A | Discharge status: Home / Self Care | Payer: Medicaid Other | Attending: Emergency Medicine | Admitting: Emergency Medicine

## 2014-08-24 ENCOUNTER — Encounter (HOSPITAL_COMMUNITY): Payer: Self-pay | Admitting: Emergency Medicine

## 2014-08-24 DIAGNOSIS — Z87891 Personal history of nicotine dependence: Secondary | ICD-10-CM | POA: Diagnosis not present

## 2014-08-24 DIAGNOSIS — I10 Essential (primary) hypertension: Secondary | ICD-10-CM | POA: Insufficient documentation

## 2014-08-24 DIAGNOSIS — G43909 Migraine, unspecified, not intractable, without status migrainosus: Secondary | ICD-10-CM | POA: Diagnosis not present

## 2014-08-24 DIAGNOSIS — Z880 Allergy status to penicillin: Secondary | ICD-10-CM | POA: Diagnosis not present

## 2014-08-24 DIAGNOSIS — K219 Gastro-esophageal reflux disease without esophagitis: Secondary | ICD-10-CM | POA: Diagnosis not present

## 2014-08-24 DIAGNOSIS — Z87440 Personal history of urinary (tract) infections: Secondary | ICD-10-CM | POA: Insufficient documentation

## 2014-08-24 DIAGNOSIS — Z8659 Personal history of other mental and behavioral disorders: Secondary | ICD-10-CM | POA: Insufficient documentation

## 2014-08-24 DIAGNOSIS — H9203 Otalgia, bilateral: Secondary | ICD-10-CM | POA: Diagnosis present

## 2014-08-24 DIAGNOSIS — Z79899 Other long term (current) drug therapy: Secondary | ICD-10-CM | POA: Insufficient documentation

## 2014-08-24 DIAGNOSIS — Z8739 Personal history of other diseases of the musculoskeletal system and connective tissue: Secondary | ICD-10-CM | POA: Diagnosis not present

## 2014-08-24 NOTE — Discharge Instructions (Signed)
Restart her Claritin. Make an appointment to follow up with ear nose and throat. Referral information provided above. Follow-up with your primary care doctor as needed.

## 2014-08-24 NOTE — ED Notes (Addendum)
Pt reports left ear pain and headache,light sensitivity since last night. nad noted. Pt denies any dizziness.

## 2014-08-24 NOTE — ED Notes (Signed)
EDP at bedside  

## 2014-08-24 NOTE — ED Provider Notes (Addendum)
CSN: [id number]     Arrival date & time 08/24/14  4944 History  This chart was scribed for Fredia Sorrow, MD by Marlowe Kays, ED Scribe. This patient was seen in room APA06/APA06 and the patient's care was started at 9:18 AM.  Chief Complaint  Patient presents with  . Otalgia   The history is provided by the patient and medical records. No language interpreter was used.    HPI Comments:  Emily Cooke is a 64 y.o. female who presents to the Emergency Department complaining of bilateral ear pain with the left ear being worse that began yesterday. She reports associated left ear drainage. She states she has Meniere's disease but has not had further testing and reports ongoing issues with her ears. She has not done anything to treat her ear pain but reports taking Claritin intermittently. She denies any modifying factors of her symptoms. Denies fever, chills, visual changes, cough, rhinorrhea, CP, SOB, abdominal pain, nausea, vomiting, dysuria, leg swelling, rash or HA.   Past Medical History  Diagnosis Date  . Hypertension   . Migraine   . Arthritis   . GERD (gastroesophageal reflux disease)   . Panic attack   . Anxiety   . Panic disorder   . UTI (lower urinary tract infection)   . Vertigo    Past Surgical History  Procedure Laterality Date  . Breast surgery    . Abdominal hysterectomy      partial   Family History  Problem Relation Age of Onset  . Hypertension Mother   . Diabetes Father    History  Substance Use Topics  . Smoking status: Former Smoker -- 2.00 packs/day for 30 years    Types: Cigarettes    Quit date: 04/01/2002  . Smokeless tobacco: Not on file  . Alcohol Use: No   OB History    Gravida Para Term Preterm AB TAB SAB Ectopic Multiple Living   4 3 1 2 1  1   3      Review of Systems  Constitutional: Negative for fever and chills.  HENT: Positive for ear discharge and ear pain. Negative for rhinorrhea and sore throat.   Eyes: Negative for visual  disturbance.  Respiratory: Negative for cough and shortness of breath.   Cardiovascular: Negative for chest pain and leg swelling.  Gastrointestinal: Negative for nausea, vomiting, abdominal pain and diarrhea.  Genitourinary: Negative for dysuria.  Musculoskeletal: Negative for back pain and neck pain.  Skin: Negative for rash.  Neurological: Negative for dizziness, light-headedness and headaches.  Hematological: Does not bruise/bleed easily.  Psychiatric/Behavioral: Negative for confusion.    Allergies  Cephalexin; Ibuprofen; Amoxicillin; Bactrim; Ciprofloxacin; Imitrex; Morphine and related; Tetracyclines & related; and Zoloft  Home Medications   Prior to Admission medications   Medication Sig Start Date End Date Taking? Authorizing Provider  azithromycin (ZITHROMAX) 250 MG tablet 1 po daily with food 06/15/14   Lily Kocher, PA-C  lisinopril (PRINIVIL,ZESTRIL) 5 MG tablet Take 0.5 tablets (2.5 mg total) by mouth daily. 04/09/14   [name], MD  meclizine (ANTIVERT) 25 MG tablet Take 1 tablet (25 mg total) by mouth 4 (four) times daily. Patient taking differently: Take 25 mg by mouth 4 (four) times daily as needed for dizziness.  04/09/14   [name], MD  promethazine (PHENERGAN) 12.5 MG tablet Take 1 tablet (12.5 mg total) by mouth every 6 (six) hours as needed for nausea. 9/67/59   Delora Fuel, MD  propranolol (INDERAL) 10 MG tablet Take 10 mg  by mouth at bedtime.      Historical Provider, MD  RABEprazole (ACIPHEX) 20 MG tablet Take 20 mg by mouth every morning.     Historical Provider, MD  valACYclovir (VALTREX) 1000 MG tablet Take 1 tablet (1,000 mg total) by mouth 3 (three) times daily. Patient not taking: Reported on 06/15/2014 12/05/13   [name], MD   Triage Vitals: BP 136/59 mmHg  Pulse 82  Temp(Src) 97.9 F (36.6 C) (Oral)  Resp 18  Ht 5\' 8"  (1.727 m)  Wt 220 lb (99.791 kg)  BMI 33.46 kg/m2  SpO2 98% Physical Exam  Constitutional: She is oriented to  person, place, and time. She appears well-developed and well-nourished.  HENT:  Head: Normocephalic and atraumatic.  Right Ear: Tympanic membrane, external ear and ear canal normal. Tympanic membrane is not erythematous.  Left Ear: Tympanic membrane, external ear and ear canal normal. Tympanic membrane is not erythematous.  Mouth/Throat: Oropharynx is clear and moist.  Eyes: Conjunctivae and EOM are normal. Pupils are equal, round, and reactive to light. No scleral icterus.  Neck: Normal range of motion.  Cardiovascular: Normal rate, regular rhythm and normal heart sounds.   No murmur heard. Pulmonary/Chest: Effort normal and breath sounds normal. No respiratory distress. She has no wheezes. She has no rales.  Abdominal: Soft. Bowel sounds are normal. There is no tenderness.  Musculoskeletal: Normal range of motion. She exhibits no edema.  Neurological: She is alert and oriented to person, place, and time. No cranial nerve deficit. She exhibits normal muscle tone. Coordination normal.  Skin: Skin is warm and dry.  Psychiatric: She has a normal mood and affect. Her behavior is normal.  Nursing note and vitals reviewed.   ED Course  Procedures (including critical care time) DIAGNOSTIC STUDIES: Oxygen Saturation is 98% on RA, normal by my interpretation.   COORDINATION OF CARE: 9:26 AM- Will give referral to ENT. Pt verbalizes understanding and agrees to plan.  Medications - No data to display  Labs Review Labs Reviewed - No data to display  Imaging Review No results found.   EKG Interpretation None      MDM   Final diagnoses:  Ear pain, bilateral    Physical examination without evidence of any external ear canal problems bilaterally. In addition TMs are normal. Patient's had some difficulties with chronic pain. States that she's been diagnosed with Mnire's disease in the past. Patient new to the area. Patient will be referred to ear nose and throat for follow-up. Patient  states she used to take Claritin in the past she has not been taking that. Recommend that she restart that.  In addition notes from triage were reviewed. Patient denied any problem with headache or dizziness at this point in time. Patient also denied any visual changes to include photophobia.  I personally performed the services described in this documentation, which was scribed in my presence. The recorded information has been reviewed and is accurate.    Fredia Sorrow, MD 08/24/14 [id number]  Fredia Sorrow, MD 08/24/14 [phone]

## 2014-11-03 ENCOUNTER — Other Ambulatory Visit (HOSPITAL_COMMUNITY): Payer: Self-pay | Admitting: Internal Medicine

## 2014-11-03 DIAGNOSIS — Z1231 Encounter for screening mammogram for malignant neoplasm of breast: Secondary | ICD-10-CM

## 2014-11-05 ENCOUNTER — Ambulatory Visit (HOSPITAL_COMMUNITY): Payer: Medicaid Other

## 2014-11-06 ENCOUNTER — Ambulatory Visit (HOSPITAL_COMMUNITY)
Admission: RE | Admit: 2014-11-06 | Discharge: 2014-11-06 | Disposition: A | Discharge status: Home / Self Care | Payer: Medicaid Other | Source: Ambulatory Visit | Attending: Internal Medicine | Admitting: Internal Medicine

## 2014-11-06 DIAGNOSIS — Z1231 Encounter for screening mammogram for malignant neoplasm of breast: Secondary | ICD-10-CM | POA: Diagnosis present

## 2014-11-15 ENCOUNTER — Emergency Department (HOSPITAL_COMMUNITY)
Admission: EM | Admit: 2014-11-15 | Discharge: 2014-11-15 | Disposition: A | Discharge status: Home / Self Care | Payer: Medicaid Other | Attending: Emergency Medicine | Admitting: Emergency Medicine

## 2014-11-15 ENCOUNTER — Encounter (HOSPITAL_COMMUNITY): Payer: Self-pay | Admitting: Emergency Medicine

## 2014-11-15 DIAGNOSIS — K219 Gastro-esophageal reflux disease without esophagitis: Secondary | ICD-10-CM | POA: Insufficient documentation

## 2014-11-15 DIAGNOSIS — B356 Tinea cruris: Secondary | ICD-10-CM | POA: Insufficient documentation

## 2014-11-15 DIAGNOSIS — F419 Anxiety disorder, unspecified: Secondary | ICD-10-CM | POA: Diagnosis not present

## 2014-11-15 DIAGNOSIS — Z79899 Other long term (current) drug therapy: Secondary | ICD-10-CM | POA: Insufficient documentation

## 2014-11-15 DIAGNOSIS — Z90710 Acquired absence of both cervix and uterus: Secondary | ICD-10-CM | POA: Diagnosis not present

## 2014-11-15 DIAGNOSIS — Z880 Allergy status to penicillin: Secondary | ICD-10-CM | POA: Insufficient documentation

## 2014-11-15 DIAGNOSIS — M545 Low back pain: Secondary | ICD-10-CM | POA: Insufficient documentation

## 2014-11-15 DIAGNOSIS — I10 Essential (primary) hypertension: Secondary | ICD-10-CM | POA: Insufficient documentation

## 2014-11-15 DIAGNOSIS — Z87891 Personal history of nicotine dependence: Secondary | ICD-10-CM | POA: Insufficient documentation

## 2014-11-15 DIAGNOSIS — Z87440 Personal history of urinary (tract) infections: Secondary | ICD-10-CM | POA: Diagnosis not present

## 2014-11-15 DIAGNOSIS — R1030 Lower abdominal pain, unspecified: Secondary | ICD-10-CM | POA: Diagnosis present

## 2014-11-15 MED ORDER — FLUCONAZOLE 100 MG PO TABS
200.0000 mg | ORAL_TABLET | Freq: Once | ORAL | Status: AC
  Administered 2014-11-15: 200 mg via ORAL
  Filled 2014-11-15: qty 2

## 2014-11-15 MED ORDER — MICONAZOLE NITRATE 2 % EX AERP: INHALATION_SPRAY | CUTANEOUS | Status: DC

## 2014-11-15 MED ORDER — ACETAMINOPHEN 500 MG PO TABS
1000.0000 mg | ORAL_TABLET | Freq: Once | ORAL | Status: AC
  Administered 2014-11-15: 1000 mg via ORAL
  Filled 2014-11-15: qty 2

## 2014-11-15 NOTE — Discharge Instructions (Signed)
You retreated in the emergency department with Diflucan to assist with the fungal type infection present in the groin area. Please use Lotrimin powder 3 times daily. Please expose this area to air as much as possible. Please see Dr. Legrand Rams for follow-up in the office.

## 2014-11-15 NOTE — ED Notes (Signed)
Pt c/o back pain and gaulding sensation to bilateral groin areas.

## 2014-11-15 NOTE — ED Provider Notes (Signed)
CSN: [id number]     Arrival date & time 11/15/14  1807 History   First MD Initiated Contact with Patient 11/15/14 1907     Chief Complaint  Patient presents with  . Back Pain  . Groin Pain     (Consider location/radiation/quality/duration/timing/severity/associated sxs/prior Treatment) HPI Comments: Right groin pain   Patient is a 64 y.o. female presenting with rash. The history is provided by the patient.  Rash Location: bilateral groin. Quality: dryness, itchiness and peeling   Severity:  Moderate Onset quality:  Gradual Timing:  Constant Progression:  Worsening Chronicity:  New Context: not hot tub use, not insect bite/sting, not medications and not new detergent/soap   Context comment:  Itchy rash between the legs in the groin. Relieved by:  Nothing Ineffective treatments: cleansing with soap and water. Associated symptoms: no joint pain and no myalgias     Past Medical History  Diagnosis Date  . Hypertension   . Migraine   . Arthritis   . GERD (gastroesophageal reflux disease)   . Panic attack   . Anxiety   . Panic disorder   . UTI (lower urinary tract infection)   . Vertigo    Past Surgical History  Procedure Laterality Date  . Breast surgery    . Abdominal hysterectomy      partial   Family History  Problem Relation Age of Onset  . Hypertension Mother   . Diabetes Father    Social History  Substance Use Topics  . Smoking status: Former Smoker -- 2.00 packs/day for 30 years    Types: Cigarettes    Quit date: 04/01/2002  . Smokeless tobacco: None  . Alcohol Use: No   OB History    Gravida Para Term Preterm AB TAB SAB Ectopic Multiple Living   4 3 1 2 1  1   3      Review of Systems  Musculoskeletal: Positive for back pain. Negative for myalgias and arthralgias.  Skin: Positive for rash.  All other systems reviewed and are negative.     Allergies  Cephalexin; Ibuprofen; Amoxicillin; Bactrim; Ciprofloxacin; Imitrex; Morphine and related;  Tetracyclines & related; and Zoloft  Home Medications   Prior to Admission medications   Medication Sig Start Date End Date Taking? Authorizing Provider  azithromycin (ZITHROMAX) 250 MG tablet 1 po daily with food 06/15/14   Lily Kocher, PA-C  lisinopril (PRINIVIL,ZESTRIL) 5 MG tablet Take 0.5 tablets (2.5 mg total) by mouth daily. 04/09/14   [name], MD  meclizine (ANTIVERT) 25 MG tablet Take 1 tablet (25 mg total) by mouth 4 (four) times daily. Patient taking differently: Take 25 mg by mouth 4 (four) times daily as needed for dizziness.  04/09/14   [name], MD  promethazine (PHENERGAN) 12.5 MG tablet Take 1 tablet (12.5 mg total) by mouth every 6 (six) hours as needed for nausea. 3/50/09   Delora Fuel, MD  propranolol (INDERAL) 10 MG tablet Take 10 mg by mouth at bedtime.      Historical Provider, MD  RABEprazole (ACIPHEX) 20 MG tablet Take 20 mg by mouth every morning.     Historical Provider, MD  valACYclovir (VALTREX) 1000 MG tablet Take 1 tablet (1,000 mg total) by mouth 3 (three) times daily. Patient not taking: Reported on 06/15/2014 12/05/13   [name], MD   BP 139/59 mmHg  Pulse 77  Temp(Src) 97.6 F (36.4 C)  Resp 18  Ht 5\' 8"  (1.727 m)  Wt 225 lb (102.059 kg)  BMI 34.22 kg/m2  SpO2 100% Physical Exam  Constitutional: She is oriented to person, place, and time. She appears well-developed and well-nourished.  Non-toxic appearance.  HENT:  Head: Normocephalic.  Right Ear: Tympanic membrane and external ear normal.  Left Ear: Tympanic membrane and external ear normal.  Eyes: EOM and lids are normal. Pupils are equal, round, and reactive to light.  Neck: Normal range of motion. Neck supple. Carotid bruit is not present.  Cardiovascular: Normal rate, regular rhythm, normal heart sounds, intact distal pulses and normal pulses.   Pulmonary/Chest: Breath sounds normal. No respiratory distress.  Abdominal: Soft. Bowel sounds are normal. There is no tenderness.  There is no guarding.  Musculoskeletal: Normal range of motion.       Lumbar back: She exhibits tenderness and pain. She exhibits normal range of motion.  Lymphadenopathy:       Head (right side): No submandibular adenopathy present.       Head (left side): No submandibular adenopathy present.    She has no cervical adenopathy.  Neurological: She is alert and oriented to person, place, and time. She has normal strength. No cranial nerve deficit or sensory deficit.  Skin: Skin is warm and dry. Rash noted. Rash is macular.     Psychiatric: She has a normal mood and affect. Her speech is normal.  Nursing note and vitals reviewed.   ED Course  Procedures (including critical care time) Labs Review Labs Reviewed - No data to display  Imaging Review No results found. I have personally reviewed and evaluated these images and lab results as part of my medical decision-making.   EKG Interpretation None      MDM  The exam favors fungal infection. Pt has a history of arthritis and back pain. Exam suggest exacerbation of this issue. Rx for Lortimin power given to the patient. Pt given Diflucan in ED. Pt to follow up with her primary MD for recheck.   Final diagnoses:  Tinea cruris    **I have reviewed nursing notes, vital signs, and all appropriate lab and imaging results for this patient.Lily Kocher, PA-C 11/21/14 [place], MD 11/22/14 [phone]

## 2014-12-02 ENCOUNTER — Emergency Department (HOSPITAL_COMMUNITY)
Admission: EM | Admit: 2014-12-02 | Discharge: 2014-12-02 | Disposition: A | Discharge status: Home / Self Care | Payer: Medicaid Other | Attending: Emergency Medicine | Admitting: Emergency Medicine

## 2014-12-02 ENCOUNTER — Emergency Department (HOSPITAL_COMMUNITY): Payer: Medicaid Other

## 2014-12-02 ENCOUNTER — Encounter (HOSPITAL_COMMUNITY): Payer: Self-pay | Admitting: Emergency Medicine

## 2014-12-02 DIAGNOSIS — K219 Gastro-esophageal reflux disease without esophagitis: Secondary | ICD-10-CM | POA: Diagnosis not present

## 2014-12-02 DIAGNOSIS — Z79899 Other long term (current) drug therapy: Secondary | ICD-10-CM | POA: Insufficient documentation

## 2014-12-02 DIAGNOSIS — J209 Acute bronchitis, unspecified: Secondary | ICD-10-CM | POA: Diagnosis not present

## 2014-12-02 DIAGNOSIS — Z87891 Personal history of nicotine dependence: Secondary | ICD-10-CM | POA: Diagnosis not present

## 2014-12-02 DIAGNOSIS — Z87440 Personal history of urinary (tract) infections: Secondary | ICD-10-CM | POA: Diagnosis not present

## 2014-12-02 DIAGNOSIS — Z8659 Personal history of other mental and behavioral disorders: Secondary | ICD-10-CM | POA: Diagnosis not present

## 2014-12-02 DIAGNOSIS — Z8739 Personal history of other diseases of the musculoskeletal system and connective tissue: Secondary | ICD-10-CM | POA: Insufficient documentation

## 2014-12-02 DIAGNOSIS — Z880 Allergy status to penicillin: Secondary | ICD-10-CM | POA: Insufficient documentation

## 2014-12-02 DIAGNOSIS — I10 Essential (primary) hypertension: Secondary | ICD-10-CM | POA: Diagnosis not present

## 2014-12-02 DIAGNOSIS — R05 Cough: Secondary | ICD-10-CM | POA: Diagnosis present

## 2014-12-02 NOTE — ED Notes (Signed)
Pt refusing to go to chest xray. States she wants to wait and see her PCP.

## 2014-12-02 NOTE — Discharge Instructions (Signed)
If you were given medicines take as directed.  If you are on coumadin or contraceptives realize their levels and effectiveness is altered by many different medicines.  If you have any reaction (rash, tongues swelling, other) to the medicines stop taking and see a physician.    If your blood pressure was elevated in the ER make sure you follow up for management with a primary doctor or return for chest pain, shortness of breath or stroke symptoms.  Please follow up as directed and return to the ER or see a physician for new or worsening symptoms.  Thank you. Filed Vitals:   12/02/14 0954  BP: 132/79  Pulse: 92  Temp: 98.4 F (36.9 C)  TempSrc: Oral  Resp: 16  Height: 5\' 8"  (1.727 m)  Weight: 220 lb (99.791 kg)  SpO2: 96%

## 2014-12-02 NOTE — ED Notes (Signed)
Pt c/o productive cough, pt states phlem is clear. Pt states she ran a fever last night. Pt denies SOB, N/V.

## 2014-12-02 NOTE — ED Provider Notes (Signed)
CSN: [id number]     Arrival date & time 12/02/14  3976 History  By signing my name below, I, Terressa Koyanagi, attest that this documentation has been prepared under the direction and in the presence of Elnora Morrison, MD. Electronically Signed: Terressa Koyanagi, ED Scribe. 12/02/2014. 10:33 AM.   Chief Complaint  Patient presents with  . Cough   HPI  PCP: Rosita Fire, MD HPI Comments: Emily Cooke is a 64 y.o. female, with PMHx noted below, who presents to the Emergency Department complaining of productive cough and congestion onset 4-5 days ago with associated fever onset last night. Pt reports taking tylenol at home with minimal relief. Pt denies any swelling of LE, Hx of lung disease,  SOB, n/v.   Past Medical History  Diagnosis Date  . Hypertension   . Migraine   . Arthritis   . GERD (gastroesophageal reflux disease)   . Panic attack   . Anxiety   . Panic disorder   . UTI (lower urinary tract infection)   . Vertigo    Past Surgical History  Procedure Laterality Date  . Breast surgery    . Abdominal hysterectomy      partial   Family History  Problem Relation Age of Onset  . Hypertension Mother   . Diabetes Father    Social History  Substance Use Topics  . Smoking status: Former Smoker -- 2.00 packs/day for 30 years    Types: Cigarettes    Quit date: 04/01/2002  . Smokeless tobacco: None  . Alcohol Use: No   OB History    Gravida Para Term Preterm AB TAB SAB Ectopic Multiple Living   4 3 1 2 1  1   3      Review of Systems  Constitutional: Positive for fever.  HENT: Positive for congestion.   Respiratory: Positive for cough. Negative for shortness of breath.   Cardiovascular: Negative for leg swelling.  Gastrointestinal: Negative for nausea and vomiting.   Allergies  Cephalexin; Ibuprofen; Amoxicillin; Bactrim; Ciprofloxacin; Imitrex; Morphine and related; Tetracyclines & related; and Zoloft  Home Medications   Prior to Admission medications    Medication Sig Start Date End Date Taking? Authorizing Provider  azithromycin (ZITHROMAX) 250 MG tablet 1 po daily with food 06/15/14   Lily Kocher, PA-C  lisinopril (PRINIVIL,ZESTRIL) 5 MG tablet Take 0.5 tablets (2.5 mg total) by mouth daily. 04/09/14   [name], MD  meclizine (ANTIVERT) 25 MG tablet Take 1 tablet (25 mg total) by mouth 4 (four) times daily. Patient taking differently: Take 25 mg by mouth 4 (four) times daily as needed for dizziness.  04/09/14   [name], MD  Miconazole Nitrate (LOTRIMIN AF POWDER) 2 % AERP Apply to right groin tid 11/15/14   Lily Kocher, PA-C  promethazine (PHENERGAN) 12.5 MG tablet Take 1 tablet (12.5 mg total) by mouth every 6 (six) hours as needed for nausea. 7/34/19   Delora Fuel, MD  propranolol (INDERAL) 10 MG tablet Take 10 mg by mouth at bedtime.      Historical Provider, MD  RABEprazole (ACIPHEX) 20 MG tablet Take 20 mg by mouth every morning.     Historical Provider, MD  valACYclovir (VALTREX) 1000 MG tablet Take 1 tablet (1,000 mg total) by mouth 3 (three) times daily. Patient not taking: Reported on 06/15/2014 12/05/13   [name], MD   Triage Vitals: BP 132/79 mmHg  Pulse 92  Temp(Src) 98.4 F (36.9 C) (Oral)  Resp 16  Ht 5\' 8"  (1.727 m)  Wt 220 lb (99.791 kg)  BMI 33.46 kg/m2  SpO2 96% Physical Exam  Constitutional: She is oriented to person, place, and time. She appears well-developed and well-nourished.  HENT:  Head: Normocephalic.  Mouth/Throat: Uvula is midline, oropharynx is clear and moist and mucous membranes are normal.  Eyes: EOM are normal.  Neck: Normal range of motion.  Cardiovascular: Normal rate and regular rhythm.   Pulmonary/Chest: Effort normal and breath sounds normal.  Congested clinically.    Abdominal: She exhibits no distension.  Musculoskeletal: Normal range of motion.  Neurological: She is alert and oriented to person, place, and time.  Psychiatric: She has a normal mood and affect.  Nursing  note and vitals reviewed.  ED Course  Procedures (including critical care time) DIAGNOSTIC STUDIES: Oxygen Saturation is 96% on ra, nl by my interpretation.    COORDINATION OF CARE: 10:09 AM: Discussed treatment plan which includes imaging or f/u with PCP with pt at bedside; patient verbalizes understanding and states she wishes to f/u with PCP instead of completing imaging at the ED today.   MDM   Final diagnoses:  Acute bronchitis, unspecified organism   Patient presents with low-grade fever and minimally productive cough. Well-appearing lungs clear no fever no focal rales. Discussed likely viral and discuss option of chest x-ray, patient prefers to follow-up with her primary Dr. and hold on chest x-ray.  Results and differential diagnosis were discussed with the patient/parent/guardian. Xrays were independently reviewed by myself.  Close follow up outpatient was discussed, comfortable with the plan.   Medications - No data to display  Filed Vitals:   12/02/14 0954  BP: 132/79  Pulse: 92  Temp: 98.4 F (36.9 C)  TempSrc: Oral  Resp: 16  Height: 5\' 8"  (1.727 m)  Weight: 220 lb (99.791 kg)  SpO2: 96%    Final diagnoses:  Acute bronchitis, unspecified organism      Elnora Morrison, MD 12/02/14 1036

## 2015-01-08 ENCOUNTER — Emergency Department (HOSPITAL_COMMUNITY)
Admission: EM | Admit: 2015-01-08 | Discharge: 2015-01-09 | Disposition: A | Discharge status: Home / Self Care | Payer: Medicaid Other | Attending: Emergency Medicine | Admitting: Emergency Medicine

## 2015-01-08 ENCOUNTER — Encounter (HOSPITAL_COMMUNITY): Payer: Self-pay | Admitting: *Deleted

## 2015-01-08 DIAGNOSIS — Z79899 Other long term (current) drug therapy: Secondary | ICD-10-CM | POA: Diagnosis not present

## 2015-01-08 DIAGNOSIS — Z880 Allergy status to penicillin: Secondary | ICD-10-CM | POA: Diagnosis not present

## 2015-01-08 DIAGNOSIS — Y93E1 Activity, personal bathing and showering: Secondary | ICD-10-CM | POA: Insufficient documentation

## 2015-01-08 DIAGNOSIS — Y9289 Other specified places as the place of occurrence of the external cause: Secondary | ICD-10-CM | POA: Insufficient documentation

## 2015-01-08 DIAGNOSIS — Z87891 Personal history of nicotine dependence: Secondary | ICD-10-CM | POA: Diagnosis not present

## 2015-01-08 DIAGNOSIS — I10 Essential (primary) hypertension: Secondary | ICD-10-CM | POA: Diagnosis not present

## 2015-01-08 DIAGNOSIS — G43909 Migraine, unspecified, not intractable, without status migrainosus: Secondary | ICD-10-CM | POA: Insufficient documentation

## 2015-01-08 DIAGNOSIS — Y998 Other external cause status: Secondary | ICD-10-CM | POA: Diagnosis not present

## 2015-01-08 DIAGNOSIS — W57XXXA Bitten or stung by nonvenomous insect and other nonvenomous arthropods, initial encounter: Secondary | ICD-10-CM | POA: Diagnosis not present

## 2015-01-08 DIAGNOSIS — K219 Gastro-esophageal reflux disease without esophagitis: Secondary | ICD-10-CM | POA: Diagnosis not present

## 2015-01-08 DIAGNOSIS — S20161A Insect bite (nonvenomous) of breast, right breast, initial encounter: Secondary | ICD-10-CM | POA: Insufficient documentation

## 2015-01-08 DIAGNOSIS — F410 Panic disorder [episodic paroxysmal anxiety] without agoraphobia: Secondary | ICD-10-CM | POA: Diagnosis not present

## 2015-01-08 DIAGNOSIS — Z87440 Personal history of urinary (tract) infections: Secondary | ICD-10-CM | POA: Insufficient documentation

## 2015-01-08 DIAGNOSIS — Z8739 Personal history of other diseases of the musculoskeletal system and connective tissue: Secondary | ICD-10-CM | POA: Diagnosis not present

## 2015-01-08 MED ORDER — DIPHENHYDRAMINE HCL 25 MG PO TABS: 25.0000 mg | ORAL_TABLET | Freq: Four times a day (QID) | ORAL | Status: DC

## 2015-01-08 MED ORDER — CLINDAMYCIN HCL 150 MG PO CAPS: 150.0000 mg | ORAL_CAPSULE | Freq: Four times a day (QID) | ORAL | Status: DC

## 2015-01-08 MED ORDER — DIPHENHYDRAMINE HCL 25 MG PO CAPS
25.0000 mg | ORAL_CAPSULE | Freq: Once | ORAL | Status: AC
  Administered 2015-01-09: 25 mg via ORAL
  Filled 2015-01-08: qty 1

## 2015-01-08 MED ORDER — CLINDAMYCIN HCL 150 MG PO CAPS
300.0000 mg | ORAL_CAPSULE | Freq: Once | ORAL | Status: AC
  Administered 2015-01-09: 300 mg via ORAL
  Filled 2015-01-08: qty 2

## 2015-01-08 NOTE — Discharge Instructions (Signed)
The area under your breast appears to the an insect bite that has caused infection of the skin. Take the antibiotics for infection and Benadryl for itching and allergic reaction. Take tylenol as needed for discomfort. Follow up with Dr. Legrand Rams.

## 2015-01-08 NOTE — ED Provider Notes (Signed)
CSN: [id number]     Arrival date & time 01/08/15  2331 History   First MD Initiated Contact with Patient 01/08/15 2332     Chief Complaint  Patient presents with  . Abscess     (Consider location/radiation/quality/duration/timing/severity/associated sxs/prior Treatment) The history is provided by the patient.   Emily Cooke is a 64 y.o. female who presents to the ED with a tender red area just below the right breast. She describes the area as burning and felling like an insect sting. She noted a white center with surrounding redness. She is not sure how long the area has been there.  Past Medical History  Diagnosis Date  . Hypertension   . Migraine   . Arthritis   . GERD (gastroesophageal reflux disease)   . Panic attack   . Anxiety   . Panic disorder   . UTI (lower urinary tract infection)   . Vertigo    Past Surgical History  Procedure Laterality Date  . Breast surgery    . Abdominal hysterectomy      partial   Family History  Problem Relation Age of Onset  . Hypertension Mother   . Diabetes Father    Social History  Substance Use Topics  . Smoking status: Former Smoker -- 2.00 packs/day for 30 years    Types: Cigarettes    Quit date: 04/01/2002  . Smokeless tobacco: None  . Alcohol Use: No   OB History    Gravida Para Term Preterm AB TAB SAB Ectopic Multiple Living   4 3 1 2 1  1   3      Review of Systems Negative except as stated in HPI   Allergies  Cephalexin; Ibuprofen; Amoxicillin; Bactrim; Ciprofloxacin; Imitrex; Morphine and related; Tetracyclines & related; and Zoloft  Home Medications   Prior to Admission medications   Medication Sig Start Date End Date Taking? Authorizing Provider  lisinopril (PRINIVIL,ZESTRIL) 5 MG tablet Take 0.5 tablets (2.5 mg total) by mouth daily. 04/09/14  Yes [name], MD  meclizine (ANTIVERT) 25 MG tablet Take 1 tablet (25 mg total) by mouth 4 (four) times daily. Patient taking differently: Take 25 mg by  mouth 4 (four) times daily as needed for dizziness.  04/09/14  Yes [name], MD  Miconazole Nitrate (LOTRIMIN AF POWDER) 2 % AERP Apply to right groin tid 11/15/14  Yes Lily Kocher, PA-C  promethazine (PHENERGAN) 12.5 MG tablet Take 1 tablet (12.5 mg total) by mouth every 6 (six) hours as needed for nausea. 0/86/57  Yes Delora Fuel, MD  propranolol (INDERAL) 10 MG tablet Take 10 mg by mouth at bedtime.     Yes Historical Provider, MD  RABEprazole (ACIPHEX) 20 MG tablet Take 20 mg by mouth every morning.    Yes Historical Provider, MD  clindamycin (CLEOCIN) 150 MG capsule Take 1 capsule (150 mg total) by mouth every 6 (six) hours. 01/08/15   Hope Bunnie Pion, NP  diphenhydrAMINE (BENADRYL) 25 MG tablet Take 1 tablet (25 mg total) by mouth every 6 (six) hours. 01/08/15   Hope Bunnie Pion, NP   BP 127/69 mmHg  Pulse 63  Temp(Src) 97.9 F (36.6 C) (Oral)  Resp 18  Ht 5\' 8"  (1.727 m)  Wt 220 lb (99.791 kg)  BMI 33.46 kg/m2  SpO2 98% Physical Exam  Constitutional: She is oriented to person, place, and time. She appears well-developed and well-nourished.  Eyes: Conjunctivae and EOM are normal.  Neck: Normal range of motion. Neck supple.  Cardiovascular: Normal  rate.   Pulmonary/Chest: Effort normal.    Small red, raised, tender area with white center that appears as an insect bite.   Abdominal: She exhibits no distension.  Musculoskeletal: Normal range of motion.  Neurological: She is alert and oriented to person, place, and time. No cranial nerve deficit.  Skin: Skin is warm and dry.  Psychiatric: She has a normal mood and affect. Her behavior is normal.  Nursing note and vitals reviewed.   ED Course  Procedures (including critical care time) Labs Review Labs Reviewed - No data to display   MDM  64 y.o. female with redness and tenderness under the right breast. Stable for d/c without fever or red streaking. Will start antibiotics and Benadryl. She will follow up with Dr. Legrand Rams or  return here as needed for worsening symptoms.   Final diagnoses:  Infected insect bite        [facility], NP 01/09/15 0003  Rolland Porter, MD 01/09/15 [phone]

## 2015-01-08 NOTE — ED Notes (Signed)
Pt c/o reddened raised area under right breast; pt states she noticed it tonight while she was in the shower

## 2015-03-29 ENCOUNTER — Encounter (HOSPITAL_COMMUNITY): Payer: Self-pay | Admitting: Emergency Medicine

## 2015-03-29 ENCOUNTER — Emergency Department (HOSPITAL_COMMUNITY)
Admission: EM | Admit: 2015-03-29 | Discharge: 2015-03-29 | Disposition: A | Discharge status: Home / Self Care | Payer: Medicaid Other | Attending: Emergency Medicine | Admitting: Emergency Medicine

## 2015-03-29 DIAGNOSIS — G43909 Migraine, unspecified, not intractable, without status migrainosus: Secondary | ICD-10-CM | POA: Insufficient documentation

## 2015-03-29 DIAGNOSIS — Z87891 Personal history of nicotine dependence: Secondary | ICD-10-CM | POA: Insufficient documentation

## 2015-03-29 DIAGNOSIS — F410 Panic disorder [episodic paroxysmal anxiety] without agoraphobia: Secondary | ICD-10-CM | POA: Insufficient documentation

## 2015-03-29 DIAGNOSIS — Z8739 Personal history of other diseases of the musculoskeletal system and connective tissue: Secondary | ICD-10-CM | POA: Insufficient documentation

## 2015-03-29 DIAGNOSIS — I10 Essential (primary) hypertension: Secondary | ICD-10-CM | POA: Insufficient documentation

## 2015-03-29 DIAGNOSIS — Z880 Allergy status to penicillin: Secondary | ICD-10-CM | POA: Diagnosis not present

## 2015-03-29 DIAGNOSIS — R519 Headache, unspecified: Secondary | ICD-10-CM

## 2015-03-29 DIAGNOSIS — R51 Headache: Secondary | ICD-10-CM | POA: Diagnosis present

## 2015-03-29 DIAGNOSIS — Z87440 Personal history of urinary (tract) infections: Secondary | ICD-10-CM | POA: Insufficient documentation

## 2015-03-29 DIAGNOSIS — K219 Gastro-esophageal reflux disease without esophagitis: Secondary | ICD-10-CM | POA: Insufficient documentation

## 2015-03-29 MED ORDER — CYCLOBENZAPRINE HCL 10 MG PO TABS: 10.0000 mg | ORAL_TABLET | Freq: Two times a day (BID) | ORAL | Status: DC | PRN

## 2015-03-29 NOTE — ED Notes (Signed)
Pt suffers from migraines, pt has had one for last 3 days, has head pain in posterior left side of head.  Pt has not taken anything for pain.  Pt alert and oriented. Pt reports dizziness yesterday, but denies any blurred vision. Ambulatory.

## 2015-03-29 NOTE — ED Provider Notes (Signed)
CSN: [id number]     Arrival date & time 03/29/15  1013 History  By signing my name below, I, Jolayne Panther, attest that this documentation has been prepared under the direction and in the presence of [name], MD. Electronically Signed: Jolayne Panther, Scribe. 03/29/2015. 11:23 AM.   Chief Complaint  Patient presents with  . Headache   The history is provided by the patient. No language interpreter was used.   HPI Comments: Emily Cooke is a 65 y.o. female with a hx of migraines, who presents to the Emergency Department complaining of mild, intermittent spasms to her left parietal scalp for the past two days. She also notes that she has been under a lot of stress recently. Pt was taking 2.5 mg of lisinopril in the morning and 10 mg of propanol at night before her physician took her off of lisinopril. Pt recently began taking lisinopril again this morning. She reports that for treatment of her past migraines she has been advised to take half of a phenergan pill before going to sleep. She has not taken phenergan recently but states that she still has a supply at home.  Past Medical History  Diagnosis Date  . Hypertension   . Migraine   . Arthritis   . GERD (gastroesophageal reflux disease)   . Panic attack   . Anxiety   . Panic disorder   . UTI (lower urinary tract infection)   . Vertigo    Past Surgical History  Procedure Laterality Date  . Breast surgery    . Abdominal hysterectomy      partial   Family History  Problem Relation Age of Onset  . Hypertension Mother   . Diabetes Father    Social History  Substance Use Topics  . Smoking status: Former Smoker -- 2.00 packs/day for 30 years    Types: Cigarettes    Quit date: 04/01/2002  . Smokeless tobacco: None  . Alcohol Use: No   OB History    Gravida Para Term Preterm AB TAB SAB Ectopic Multiple Living   4 3 1 2 1  1   3      Review of Systems  A complete 10 system review of systems was obtained and  all systems are negative except as noted in the HPI and PMH.   Allergies  Cephalexin; Ibuprofen; Amoxicillin; Bactrim; Ciprofloxacin; Imitrex; Morphine and related; Tetracyclines & related; and Zoloft  Home Medications   Prior to Admission medications   Medication Sig Start Date End Date Taking? Authorizing Provider  hydrOXYzine (ATARAX/VISTARIL) 25 MG tablet TK 1 T PO  TID PRN 01/12/15  Yes Historical Provider, MD  lisinopril (PRINIVIL,ZESTRIL) 5 MG tablet Take 0.5 tablets (2.5 mg total) by mouth daily. 04/09/14  Yes [name], MD  loratadine (CLARITIN) 10 MG tablet Take 10 mg by mouth daily.   Yes Historical Provider, MD  meclizine (ANTIVERT) 25 MG tablet Take 1 tablet (25 mg total) by mouth 4 (four) times daily. Patient taking differently: Take 25 mg by mouth 4 (four) times daily as needed for dizziness.  04/09/14  Yes [name], MD  promethazine (PHENERGAN) 12.5 MG tablet Take 1 tablet (12.5 mg total) by mouth every 6 (six) hours as needed for nausea. [id number]  Yes Delora Fuel, MD  propranolol (INDERAL) 10 MG tablet Take 10 mg by mouth at bedtime.     Yes Historical Provider, MD  RABEprazole (ACIPHEX) 20 MG tablet Take 20 mg by mouth every morning.  Yes Historical Provider, MD  clindamycin (CLEOCIN) 150 MG capsule Take 1 capsule (150 mg total) by mouth every 6 (six) hours. Patient not taking: Reported on 03/29/2015 01/08/15   Ashley Murrain, NP  cyclobenzaprine (FLEXERIL) 10 MG tablet Take 1 tablet (10 mg total) by mouth 2 (two) times daily as needed for muscle spasms. 03/29/15   [name], MD  diphenhydrAMINE (BENADRYL) 25 MG tablet Take 1 tablet (25 mg total) by mouth every 6 (six) hours. Patient not taking: Reported on 03/29/2015 01/08/15   Ashley Murrain, NP  Miconazole Nitrate (LOTRIMIN AF POWDER) 2 % AERP Apply to right groin tid Patient not taking: Reported on 03/29/2015 11/15/14   Lily Kocher, PA-C   BP 156/77 mmHg  Pulse 82  Temp(Src) 98.4 F (36.9 C) (Oral)  Resp 16  Ht 5'  8" (1.727 m)  Wt 220 lb (99.791 kg)  BMI 33.46 kg/m2  SpO2 98% Physical Exam  Constitutional: She is oriented to person, place, and time. She appears well-developed and well-nourished.  HENT:  Head: Normocephalic and atraumatic.  Eyes: Conjunctivae and EOM are normal. Pupils are equal, round, and reactive to light.  Neck: Normal range of motion. Neck supple.  Cardiovascular: Normal rate and regular rhythm.   Pulmonary/Chest: Effort normal and breath sounds normal.  Abdominal: Soft. Bowel sounds are normal.  Musculoskeletal: Normal range of motion.  Neurological: She is alert and oriented to person, place, and time.  No neurological deficits   Skin: Skin is warm and dry.  Psychiatric: She has a normal mood and affect. Her behavior is normal.  Nursing note and vitals reviewed.   ED Course  Procedures  DIAGNOSTIC STUDIES:    Oxygen Saturation is 98% on RA, normal by my interpretation.   COORDINATION OF CARE:  11:04 AM Will administer pt 10 mg flexeril. Discussed treatment plan with pt at bedside and pt agreed to plan.    MDM   Final diagnoses:  Intractable headache, unspecified chronicity pattern, unspecified headache type    No evidence of neurological deficits or stiff neck. CT head not indicated. Flexeril has helped in the past. Rx Flexeril 10 mg. Follow-up primary care doctor.   I personally performed the services described in this documentation, which was scribed in my presence. The recorded information has been reviewed and is accurate.     [name], MD 03/29/15 [phone]

## 2015-03-29 NOTE — Discharge Instructions (Signed)
Prescription for muscle spasm medicine. Take  Phenergan as needed. Follow-up your primary care doctor

## 2015-04-18 ENCOUNTER — Emergency Department (HOSPITAL_COMMUNITY)
Admission: EM | Admit: 2015-04-18 | Discharge: 2015-04-18 | Disposition: A | Discharge status: Home / Self Care | Payer: Medicaid Other | Attending: Emergency Medicine | Admitting: Emergency Medicine

## 2015-04-18 ENCOUNTER — Encounter (HOSPITAL_COMMUNITY): Payer: Self-pay | Admitting: Emergency Medicine

## 2015-04-18 DIAGNOSIS — Z880 Allergy status to penicillin: Secondary | ICD-10-CM | POA: Diagnosis not present

## 2015-04-18 DIAGNOSIS — Z87440 Personal history of urinary (tract) infections: Secondary | ICD-10-CM | POA: Insufficient documentation

## 2015-04-18 DIAGNOSIS — Z8739 Personal history of other diseases of the musculoskeletal system and connective tissue: Secondary | ICD-10-CM | POA: Insufficient documentation

## 2015-04-18 DIAGNOSIS — F410 Panic disorder [episodic paroxysmal anxiety] without agoraphobia: Secondary | ICD-10-CM | POA: Diagnosis not present

## 2015-04-18 DIAGNOSIS — I10 Essential (primary) hypertension: Secondary | ICD-10-CM | POA: Insufficient documentation

## 2015-04-18 DIAGNOSIS — Z87891 Personal history of nicotine dependence: Secondary | ICD-10-CM | POA: Insufficient documentation

## 2015-04-18 DIAGNOSIS — Z79899 Other long term (current) drug therapy: Secondary | ICD-10-CM | POA: Insufficient documentation

## 2015-04-18 DIAGNOSIS — K219 Gastro-esophageal reflux disease without esophagitis: Secondary | ICD-10-CM | POA: Insufficient documentation

## 2015-04-18 DIAGNOSIS — R102 Pelvic and perineal pain: Secondary | ICD-10-CM | POA: Diagnosis present

## 2015-04-18 DIAGNOSIS — L739 Follicular disorder, unspecified: Secondary | ICD-10-CM | POA: Diagnosis not present

## 2015-04-18 LAB — URINALYSIS, ROUTINE W REFLEX MICROSCOPIC
Bilirubin Urine: NEGATIVE
GLUCOSE, UA: NEGATIVE mg/dL
Hgb urine dipstick: NEGATIVE
LEUKOCYTES UA: NEGATIVE
Nitrite: NEGATIVE
PH: 5.5 (ref 5.0–8.0)
PROTEIN: NEGATIVE mg/dL
Specific Gravity, Urine: 1.025 (ref 1.005–1.030)

## 2015-04-18 LAB — WET PREP, GENITAL
CLUE CELLS WET PREP: NONE SEEN
Sperm: NONE SEEN
TRICH WET PREP: NONE SEEN
YEAST WET PREP: NONE SEEN

## 2015-04-18 NOTE — ED Notes (Signed)
Pelvic materials set up at bedside.

## 2015-04-18 NOTE — Discharge Instructions (Signed)
Folliculitis °Folliculitis is redness, soreness, and swelling (inflammation) of the hair follicles. This condition can occur anywhere on the body. People with weakened immune systems, diabetes, or obesity have a greater risk of getting folliculitis. °CAUSES °· Bacterial infection. This is the most common cause. °· Fungal infection. °· Viral infection. °· Contact with certain chemicals, especially oils and tars. °Long-term folliculitis can result from bacteria that live in the nostrils. The bacteria may trigger multiple outbreaks of folliculitis over time. °SYMPTOMS °Folliculitis most commonly occurs on the scalp, thighs, legs, back, buttocks, and areas where hair is shaved frequently. An early sign of folliculitis is a small, white or yellow, pus-filled, itchy lesion (pustule). These lesions appear on a red, inflamed follicle. They are usually less than 0.2 inches (5 mm) wide. When there is an infection of the follicle that goes deeper, it becomes a boil or furuncle. A group of closely packed boils creates a larger lesion (carbuncle). Carbuncles tend to occur in hairy, sweaty areas of the body. °DIAGNOSIS  °Your caregiver can usually tell what is wrong by doing a physical exam. A sample may be taken from one of the lesions and tested in a lab. This can help determine what is causing your folliculitis. °TREATMENT  °Treatment may include: °· Applying warm compresses to the affected areas. °· Taking antibiotic medicines orally or applying them to the skin. °· Draining the lesions if they contain a large amount of pus or fluid. °· Laser hair removal for cases of long-lasting folliculitis. This helps to prevent regrowth of the hair. °HOME CARE INSTRUCTIONS °· Apply warm compresses to the affected areas as directed by your caregiver. °· If antibiotics are prescribed, take them as directed. Finish them even if you start to feel better. °· You may take over-the-counter medicines to relieve itching. °· Do not shave irritated  skin. °· Follow up with your caregiver as directed. °SEEK IMMEDIATE MEDICAL CARE IF:  °· You have increasing redness, swelling, or pain in the affected area. °· You have a fever. °MAKE SURE YOU: °· Understand these instructions. °· Will watch your condition. °· Will get help right away if you are not doing well or get worse. °  °This information is not intended to replace advice given to you by your health care provider. Make sure you discuss any questions you have with your health care provider. °  °Document Released: 05/02/2001 Document Revised: 03/14/2014 Document Reviewed: 05/24/2011 °Elsevier Interactive Patient Education ©2016 Else[employer] Inc[employer] ° °

## 2015-04-18 NOTE — ED Provider Notes (Signed)
CSN: [id number]     Arrival date & time 04/18/15  [id number] History   First MD Initiated Contact with Patient 04/18/15 [id number]     Chief Complaint  Patient presents with  . Abscess     (Consider location/radiation/quality/duration/timing/severity/associated sxs/prior Treatment) HPI  ROSELENA REYMAN is a 65 y.o. female who presents to the Emergency Department complaining of irritation, redness to her left labia majora for several days.  She states she has burning to the area with urination.  She has been applying warm compresses with some relief.  She also reports having a new sexual partner and since intercourse, she has noticed a discharge and pelvic pain.  She denies vaginal bleeding, abdominal pain, fever, chills, N/V.  Patient also reports cleaning her face with vinegar this morning, and having redness and burning of her face, but sh states the symptoms have begun to improve after cleaning her face with soap and water and applying moisturizer.  She denies swelling of her face.     Past Medical History  Diagnosis Date  . Hypertension   . Migraine   . Arthritis   . GERD (gastroesophageal reflux disease)   . Panic attack   . Anxiety   . Panic disorder   . UTI (lower urinary tract infection)   . Vertigo    Past Surgical History  Procedure Laterality Date  . Breast surgery    . Abdominal hysterectomy      partial   Family History  Problem Relation Age of Onset  . Hypertension Mother   . Diabetes Father    Social History  Substance Use Topics  . Smoking status: Former Smoker -- 2.00 packs/day for 30 years    Types: Cigarettes    Quit date: 04/01/2002  . Smokeless tobacco: None  . Alcohol Use: No   OB History    Gravida Para Term Preterm AB TAB SAB Ectopic Multiple Living   4 3 1 2 1  1   3      Review of Systems  Constitutional: Negative for fever and chills.  Gastrointestinal: Negative for nausea and vomiting.  Musculoskeletal: Negative for joint swelling and  arthralgias.  Skin: Positive for color change.       Abscess   Hematological: Negative for adenopathy.  All other systems reviewed and are negative.     Allergies  Cephalexin; Ibuprofen; Amoxicillin; Bactrim; Ciprofloxacin; Imitrex; Morphine and related; Tetracyclines & related; and Zoloft  Home Medications   Prior to Admission medications   Medication Sig Start Date End Date Taking? Authorizing Provider  clindamycin (CLEOCIN) 150 MG capsule Take 1 capsule (150 mg total) by mouth every 6 (six) hours. Patient not taking: Reported on 03/29/2015 01/08/15   Ashley Murrain, NP  cyclobenzaprine (FLEXERIL) 10 MG tablet Take 1 tablet (10 mg total) by mouth 2 (two) times daily as needed for muscle spasms. 03/29/15   [name], MD  diphenhydrAMINE (BENADRYL) 25 MG tablet Take 1 tablet (25 mg total) by mouth every 6 (six) hours. Patient not taking: Reported on 03/29/2015 01/08/15   Ashley Murrain, NP  hydrOXYzine (ATARAX/VISTARIL) 25 MG tablet TK 1 T PO  TID PRN 01/12/15   Historical Provider, MD  lisinopril (PRINIVIL,ZESTRIL) 5 MG tablet Take 0.5 tablets (2.5 mg total) by mouth daily. 04/09/14   [name], MD  loratadine (CLARITIN) 10 MG tablet Take 10 mg by mouth daily.    Historical Provider, MD  meclizine (ANTIVERT) 25 MG tablet Take 1 tablet (25 mg total) by mouth  4 (four) times daily. Patient taking differently: Take 25 mg by mouth 4 (four) times daily as needed for dizziness.  04/09/14   [name], MD  Miconazole Nitrate (LOTRIMIN AF POWDER) 2 % AERP Apply to right groin tid Patient not taking: Reported on 03/29/2015 11/15/14   Lily Kocher, PA-C  promethazine (PHENERGAN) 12.5 MG tablet Take 1 tablet (12.5 mg total) by mouth every 6 (six) hours as needed for nausea. [id number]   Delora Fuel, MD  propranolol (INDERAL) 10 MG tablet Take 10 mg by mouth at bedtime.      Historical Provider, MD  RABEprazole (ACIPHEX) 20 MG tablet Take 20 mg by mouth every morning.     Historical Provider, MD   BP  166/78 mmHg  Pulse 74  Temp(Src) 98 F (36.7 C)  Resp 18  Ht 5\' 8"  (1.727 m)  Wt 99.791 kg  BMI 33.46 kg/m2  SpO2 100% Physical Exam  Constitutional: She is oriented to person, place, and time. She appears well-developed and well-nourished. No distress.  HENT:  Head: Normocephalic and atraumatic.  Cardiovascular: Normal rate, regular rhythm and normal heart sounds.   No murmur heard. Pulmonary/Chest: Effort normal and breath sounds normal. No respiratory distress.  Genitourinary:  Exam chaperoned by nursing.  S/p partial hysterectomy.  No significant vaginal d/c.  No adnexal masses or tenderness.   Neurological: She is alert and oriented to person, place, and time. She exhibits normal muscle tone. Coordination normal.  Skin: Skin is warm and dry. No erythema.  <1 cm flesh colored papule to the left labia majora.  No induration, fluctuance or surrounding erythema   Nursing note and vitals reviewed.   ED Course  Procedures (including critical care time) Labs Review Labs Reviewed  WET PREP, GENITAL  URINALYSIS, ROUTINE W REFLEX MICROSCOPIC (NOT AT [facility])  RPR  GC/CHLAMYDIA PROBE AMP ([facility]) NOT AT [facility]    Imaging Review No results found. I have personally reviewed and evaluated these images and lab results as part of my medical decision-making.   EKG Interpretation None      MDM   Final diagnoses:  Folliculitis    Pt is well appearing.  Vitals stable.  No abscess of the labia.  Very small, single pustule to the left labia majora without induration or surrounding erythema.  Cultures pending  Pt appears stable for d/c.  Agrees to PMD f/u if needed.    Kem Parkinson, PA-C 04/19/15 2143  Tanna Furry, MD 04/28/15 2255

## 2015-04-18 NOTE — ED Notes (Signed)
Pt states that she has a boil on the inside of her labia, also applied vinegar to her face as a beauty remedy and now her face is red and peeling.

## 2015-04-19 LAB — RPR: RPR: NONREACTIVE

## 2015-04-20 LAB — GC/CHLAMYDIA PROBE AMP (~~LOC~~) NOT AT ARMC
Chlamydia: NEGATIVE
Neisseria Gonorrhea: NEGATIVE

## 2015-07-27 ENCOUNTER — Emergency Department (HOSPITAL_COMMUNITY)
Admission: EM | Admit: 2015-07-27 | Discharge: 2015-07-27 | Disposition: A | Discharge status: Home / Self Care | Payer: Medicaid Other | Attending: Emergency Medicine | Admitting: Emergency Medicine

## 2015-07-27 ENCOUNTER — Encounter (HOSPITAL_COMMUNITY): Payer: Self-pay | Admitting: Emergency Medicine

## 2015-07-27 DIAGNOSIS — I10 Essential (primary) hypertension: Secondary | ICD-10-CM | POA: Diagnosis not present

## 2015-07-27 DIAGNOSIS — Z79899 Other long term (current) drug therapy: Secondary | ICD-10-CM | POA: Diagnosis not present

## 2015-07-27 DIAGNOSIS — L0231 Cutaneous abscess of buttock: Secondary | ICD-10-CM | POA: Diagnosis present

## 2015-07-27 DIAGNOSIS — B029 Zoster without complications: Secondary | ICD-10-CM | POA: Diagnosis not present

## 2015-07-27 DIAGNOSIS — Z87891 Personal history of nicotine dependence: Secondary | ICD-10-CM | POA: Insufficient documentation

## 2015-07-27 DIAGNOSIS — M1990 Unspecified osteoarthritis, unspecified site: Secondary | ICD-10-CM | POA: Diagnosis not present

## 2015-07-27 DIAGNOSIS — R51 Headache: Secondary | ICD-10-CM | POA: Insufficient documentation

## 2015-07-27 MED ORDER — HYDROCODONE-ACETAMINOPHEN 7.5-325 MG PO TABS: 1.0000 | ORAL_TABLET | ORAL | Status: DC | PRN

## 2015-07-27 MED ORDER — IBUPROFEN 600 MG PO TABS
600.0000 mg | ORAL_TABLET | Freq: Four times a day (QID) | ORAL | Status: DC
Start: 2015-07-27 — End: 2016-01-04

## 2015-07-27 MED ORDER — ACYCLOVIR 800 MG PO TABS: 800.0000 mg | ORAL_TABLET | Freq: Four times a day (QID) | ORAL | Status: DC

## 2015-07-27 MED ORDER — HYDROCODONE-ACETAMINOPHEN 5-325 MG PO TABS
2.0000 | ORAL_TABLET | Freq: Once | ORAL | Status: DC
  Filled 2015-07-27: qty 2

## 2015-07-27 MED ORDER — IBUPROFEN 400 MG PO TABS
400.0000 mg | ORAL_TABLET | Freq: Once | ORAL | Status: DC
  Filled 2015-07-27: qty 1

## 2015-07-27 MED ORDER — ACYCLOVIR 800 MG PO TABS
800.0000 mg | ORAL_TABLET | Freq: Once | ORAL | Status: AC
  Administered 2015-07-27: 800 mg via ORAL
  Filled 2015-07-27: qty 1

## 2015-07-27 NOTE — ED Notes (Signed)
Pt alert & oriented x4, stable gait. Patient given discharge instructions, paperwork & prescription(s). Patient informed not to drive, operate any equipment & handel any important documents 4 hours after taking pain medication. Patient  instructed to stop at the registration desk to finish any additional paperwork. Patient  verbalized understanding. Pt left department w/ no further questions. 

## 2015-07-27 NOTE — ED Notes (Signed)
Patient complaining of abscess to right side of buttock since Saturday. Denies drainage from area.

## 2015-07-27 NOTE — Discharge Instructions (Signed)
Please use a pillow to sit on. Please use acyclovir 800mg  daily with food. Use norco for more severe pain. See Dr Legrand Rams for follow up in the office. Shingles Shingles is an infection that causes a painful skin rash and fluid-filled blisters. Shingles is caused by the same virus that causes chickenpox. Shingles only develops in people who:  Have had chickenpox.  Have gotten the chickenpox vaccine. (This is rare.) The first symptoms of shingles may be itching, tingling, or pain in an area on your skin. A rash will follow in a few days or weeks. The rash is usually on one side of the body in a bandlike or beltlike pattern. Over time, the rash turns into fluid-filled blisters that break open, scab over, and dry up. Medicines may:  Help you manage pain.  Help you recover more quickly.  Help to prevent long-term problems. HOME CARE Medicines  Take medicines only as told by your doctor.  Apply an anti-itch or numbing cream to the affected area as told by your doctor. Blister and Rash Care  Take a cool bath or put cool compresses on the area of the rash or blisters as told by your doctor. This may help with pain and itching.  Keep your rash covered with a loose bandage (dressing). Wear loose-fitting clothing.  Keep your rash and blisters clean with mild soap and cool water or as told by your doctor.  Check your rash every day for signs of infection. These include redness, swelling, and pain that lasts or gets worse.  Do not pick your blisters.  Do not scratch your rash. General Instructions  Rest as told by your doctor.  Keep all follow-up visits as told by your doctor. This is important.  Until your blisters scab over, your infection can cause chickenpox in people who have never had it or been vaccinated against it. To prevent this from happening, avoid touching other people or being around other people, especially:  Babies.  Pregnant women.  Children who have  eczema.  Elderly people who have transplants.  People who have chronic illnesses, such as leukemia or AIDS. GET HELP IF:  Your pain does not get better with medicine.  Your pain does not get better after the rash heals.  Your rash looks infected. Signs of infection include:  Redness.  Swelling.  Pain that lasts or gets worse. GET HELP RIGHT AWAY IF:  The rash is on your face or nose.  You have pain in your face, pain around your eye area, or loss of feeling on one side of your face.  You have ear pain or you have ringing in your ear.  You have loss of taste.  Your condition gets worse.   This information is not intended to replace advice given to you by your health care provider. Make sure you discuss any questions you have with your health care provider.   Document Released: 08/10/2007 Document Revised: 03/14/2014 Document Reviewed: 12/03/2013 Elsevier Interactive Patient Education [employer].

## 2015-07-27 NOTE — ED Provider Notes (Signed)
CSN: [id number]     Arrival date & time 07/27/15  1833 History  By signing my name below, I, Emily Cooke, attest that this documentation has been prepared under the direction and in the presence of Treatment Team:  Attending Provider: Fredia Sorrow, MD Physician Assistant: Lily Kocher, PA-C.  Electronically Signed: Verlee Monte, Medical Scribe. 07/27/2015. 7:24 PM.   Chief Complaint  Patient presents with  . Abscess   The history is provided by the patient. No language interpreter was used.    HPI Comments: Emily Cooke is a 65 y.o. female who presents to the Emergency Department complaining of abscess on her right buttock cheek onset 2 days ago. She states that she is having associated symptoms of pain on right buttock when she sits. Pt mentions having chicken pox as a child. Pt reports red spot on her lower legs but thinks it's unrelated. Pt has been having HA on the right side of her head.  Past Medical History  Diagnosis Date  . Hypertension   . Migraine   . Arthritis   . GERD (gastroesophageal reflux disease)   . Panic attack   . Anxiety   . Panic disorder   . UTI (lower urinary tract infection)   . Vertigo    Past Surgical History  Procedure Laterality Date  . Breast surgery    . Abdominal hysterectomy      partial   Family History  Problem Relation Age of Onset  . Hypertension Mother   . Diabetes Father    Social History  Substance Use Topics  . Smoking status: Former Smoker -- 2.00 packs/day for 30 years    Types: Cigarettes    Quit date: 04/01/2002  . Smokeless tobacco: None  . Alcohol Use: No   OB History    Gravida Para Term Preterm AB TAB SAB Ectopic Multiple Living   4 3 1 2 1  1   3      Review of Systems  Skin: Positive for rash.  Neurological: Positive for headaches (right side).  All other systems reviewed and are negative.  Allergies  Cephalexin; Ibuprofen; Amoxicillin; Bactrim; Ciprofloxacin; Imitrex; Morphine and related;  Tetracyclines & related; and Zoloft  Home Medications   Prior to Admission medications   Medication Sig Start Date End Date Taking? Authorizing Provider  clindamycin (CLEOCIN) 150 MG capsule Take 1 capsule (150 mg total) by mouth every 6 (six) hours. Patient not taking: Reported on 03/29/2015 01/08/15   Ashley Murrain, NP  cyclobenzaprine (FLEXERIL) 10 MG tablet Take 1 tablet (10 mg total) by mouth 2 (two) times daily as needed for muscle spasms. 03/29/15   [name], MD  diphenhydrAMINE (BENADRYL) 25 MG tablet Take 1 tablet (25 mg total) by mouth every 6 (six) hours. Patient not taking: Reported on 03/29/2015 01/08/15   Ashley Murrain, NP  hydrOXYzine (ATARAX/VISTARIL) 25 MG tablet Take 1 tablet by mouth three times daily as needed anxiety. 01/12/15   Historical Provider, MD  lisinopril (PRINIVIL,ZESTRIL) 5 MG tablet Take 0.5 tablets (2.5 mg total) by mouth daily. 04/09/14   [name], MD  loratadine (CLARITIN) 10 MG tablet Take 10 mg by mouth daily as needed for allergies.     Historical Provider, MD  meclizine (ANTIVERT) 25 MG tablet Take 1 tablet (25 mg total) by mouth 4 (four) times daily. Patient taking differently: Take 25 mg by mouth 4 (four) times daily as needed for dizziness.  04/09/14   [name], MD  Miconazole Nitrate (LOTRIMIN  AF POWDER) 2 % AERP Apply to right groin tid Patient not taking: Reported on 03/29/2015 11/15/14   Lily Kocher, PA-C  promethazine (PHENERGAN) 12.5 MG tablet Take 1 tablet (12.5 mg total) by mouth every 6 (six) hours as needed for nausea. Patient taking differently: Take 12.5 mg by mouth every 6 (six) hours as needed for nausea (for migraines).  [id number]   Delora Fuel, MD  propranolol (INDERAL) 10 MG tablet Take 10 mg by mouth at bedtime.      Historical Provider, MD  RABEprazole (ACIPHEX) 20 MG tablet Take 20 mg by mouth every morning.     Historical Provider, MD   BP 157/87 mmHg  Pulse 84  Temp(Src) 98.2 F (36.8 C) (Oral)  Resp 16  Wt 224 lb (101.606  kg)  SpO2 100% Physical Exam  Constitutional: She is oriented to person, place, and time. She appears well-developed and well-nourished.  Non-toxic appearance.  HENT:  Head: Normocephalic.  Right Ear: Tympanic membrane and external ear normal.  Left Ear: Tympanic membrane and external ear normal.  Eyes: EOM and lids are normal. Pupils are equal, round, and reactive to light.  Neck: Normal range of motion. Neck supple. Carotid bruit is not present.  Cardiovascular: Normal rate, regular rhythm, normal heart sounds, intact distal pulses and normal pulses.   Pulmonary/Chest: Breath sounds normal. No respiratory distress.  Abdominal: Soft. Bowel sounds are normal. There is no tenderness. There is no guarding.  Musculoskeletal: Normal range of motion.  Lymphadenopathy:       Head (right side): No submandibular adenopathy present.       Head (left side): No submandibular adenopathy present.    She has no cervical adenopathy.  Neurological: She is alert and oriented to person, place, and time. She has normal strength. No cranial nerve deficit or sensory deficit.  Skin: Skin is warm and dry.  1x2 cm area of redness of the upper inner aspect of the right buttock Several small pinpoint small pustules present There are two other areas of increased redness present No red streaks appreciated Buttocks is not hot  Psychiatric: She has a normal mood and affect. Her speech is normal.  Nursing note and vitals reviewed.  ED Course  Procedures  DIAGNOSTIC STUDIES: Oxygen Saturation is 100% on RA, NL by my interpretation.    COORDINATION OF CARE: 7:24 PM Discussed treatment plan with pt at bedside and pt agreed to plan.  MDM  No reported insect bites. The pattern does not favor a contact dermatitis. Suspect shingles. Rx for acyclovir and norco given to the patient. Pt to see PCP as soon as possible for follow up and management.   Final diagnoses:  Shingles    *I have reviewed nursing notes,  vital signs, and all appropriate lab and imaging results for this patient.**  **I personally performed the services described in this documentation, which was scribed in my presence. The recorded information has been reviewed and is accurate.Lily Kocher, PA-C 08/01/15 [id number]  Fredia Sorrow, MD 08/25/15 [phone]

## 2015-11-21 ENCOUNTER — Encounter (HOSPITAL_COMMUNITY): Payer: Self-pay | Admitting: Emergency Medicine

## 2015-11-21 ENCOUNTER — Emergency Department (HOSPITAL_COMMUNITY)
Admission: EM | Admit: 2015-11-21 | Discharge: 2015-11-21 | Disposition: A | Discharge status: Home / Self Care | Payer: Medicaid Other | Attending: Emergency Medicine | Admitting: Emergency Medicine

## 2015-11-21 DIAGNOSIS — Z79899 Other long term (current) drug therapy: Secondary | ICD-10-CM | POA: Insufficient documentation

## 2015-11-21 DIAGNOSIS — H6091 Unspecified otitis externa, right ear: Secondary | ICD-10-CM

## 2015-11-21 DIAGNOSIS — Z87891 Personal history of nicotine dependence: Secondary | ICD-10-CM | POA: Insufficient documentation

## 2015-11-21 DIAGNOSIS — R42 Dizziness and giddiness: Secondary | ICD-10-CM | POA: Diagnosis not present

## 2015-11-21 MED ORDER — MECLIZINE HCL 25 MG PO TABS: 25.0000 mg | ORAL_TABLET | Freq: Three times a day (TID) | ORAL | 0 refills | Status: DC | PRN

## 2015-11-21 MED ORDER — NEOMYCIN-POLYMYXIN-HC 3.5-10000-1 OT SUSP: 4.0000 [drp] | Freq: Four times a day (QID) | OTIC | 0 refills | Status: DC

## 2015-11-21 NOTE — Discharge Instructions (Signed)
Cortisporin drops as prescribed.  Meclizine as prescribed as needed for dizziness.  Return to the ER if your symptoms significantly worsen or change.

## 2015-11-21 NOTE — ED Triage Notes (Signed)
PT c/o bilateral ear pain with hx of recurrent ear infections. PT also states dizziness at times x3 days. PT also states is out of her prescription medication for meclizine.

## 2015-11-21 NOTE — ED Provider Notes (Signed)
[facility] DEPT Provider Note   CSN: [id number] Arrival date & time: 11/21/15  1149  By signing my name below, I, Emily Cooke, attest that this documentation has been prepared under the direction and in the presence of Emily Speak, MD. Electronically Signed: Reola Cooke, ED Scribe. 11/21/15. 12:55 PM.  History   Chief Complaint Chief Complaint  Patient presents with  . Dizziness   The history is provided by the patient. No language interpreter was used.  Dizziness  Quality:  Room spinning Severity:  Mild Onset quality:  Gradual Duration:  3 days Progression:  Waxing and waning Chronicity:  New Context: head movement   Relieved by:  Nothing Worsened by:  Turning head Ineffective treatments:  None tried Associated symptoms: no hearing loss and no weakness   Risk factors: Meniere's disease    HPI Comments: Emily Cooke is a 65 y.o. female with a PMHx of Meniere's, who presents to the Emergency Department complaining of gradual onset, constant bilateral ear pain and sensation of fullness onset PTA. She states that her pain in the right ear is more worse compared to the left. Pt reports associated room-spinning dizziness x ~3 days. She states that she has a hx of similar ear pain w/ recurrent ear infections. Her dizziness is exacerbated with turning her head. Pt typically takes Meclizine for this issue, but notes that she has recently ran out. Denies numbness, weakness, new hearing loss, or any other associated symptoms.   PCP: Rosita Fire, MD  Past Medical History:  Diagnosis Date  . Anxiety   . Arthritis   . GERD (gastroesophageal reflux disease)   . Hypertension   . Migraine   . Panic attack   . Panic disorder   . UTI (lower urinary tract infection)   . Vertigo    There are no active problems to display for this patient.  Past Surgical History:  Procedure Laterality Date  . ABDOMINAL HYSTERECTOMY     partial  . BREAST SURGERY     OB  History    Gravida Para Term Preterm AB Living   4 3 1 2 1 3    SAB TAB Ectopic Multiple Live Births   1             Home Medications    Prior to Admission medications   Medication Sig Start Date End Date Taking? Authorizing Provider  acyclovir (ZOVIRAX) 800 MG tablet Take 1 tablet (800 mg total) by mouth 4 (four) times daily. 07/27/15   Lily Kocher, PA-C  clindamycin (CLEOCIN) 150 MG capsule Take 1 capsule (150 mg total) by mouth every 6 (six) hours. Patient not taking: Reported on 03/29/2015 01/08/15   Ashley Murrain, NP  cyclobenzaprine (FLEXERIL) 10 MG tablet Take 1 tablet (10 mg total) by mouth 2 (two) times daily as needed for muscle spasms. 03/29/15   [name], MD  diphenhydrAMINE (BENADRYL) 25 MG tablet Take 1 tablet (25 mg total) by mouth every 6 (six) hours. Patient not taking: Reported on 03/29/2015 01/08/15   Ashley Murrain, NP  HYDROcodone-acetaminophen (NORCO) 7.5-325 MG tablet Take 1 tablet by mouth every 4 (four) hours as needed. 07/27/15   Lily Kocher, PA-C  hydrOXYzine (ATARAX/VISTARIL) 25 MG tablet Take 1 tablet by mouth three times daily as needed anxiety. 01/12/15   Historical Provider, MD  ibuprofen (ADVIL,MOTRIN) 600 MG tablet Take 1 tablet (600 mg total) by mouth 4 (four) times daily. 07/27/15   Lily Kocher, PA-C  lisinopril (PRINIVIL,ZESTRIL) 5 MG tablet  Take 0.5 tablets (2.5 mg total) by mouth daily. 04/09/14   [name], MD  loratadine (CLARITIN) 10 MG tablet Take 10 mg by mouth daily as needed for allergies.     Historical Provider, MD  meclizine (ANTIVERT) 25 MG tablet Take 1 tablet (25 mg total) by mouth 4 (four) times daily. Patient taking differently: Take 25 mg by mouth 4 (four) times daily as needed for dizziness.  04/09/14   [name], MD  Miconazole Nitrate (LOTRIMIN AF POWDER) 2 % AERP Apply to right groin tid Patient not taking: Reported on 03/29/2015 11/15/14   Lily Kocher, PA-C  promethazine (PHENERGAN) 12.5 MG tablet Take 1 tablet (12.5 mg total)  by mouth every 6 (six) hours as needed for nausea. Patient taking differently: Take 12.5 mg by mouth every 6 (six) hours as needed for nausea (for migraines).  [id number]   Delora Fuel, MD  propranolol (INDERAL) 10 MG tablet Take 10 mg by mouth at bedtime.      Historical Provider, MD  RABEprazole (ACIPHEX) 20 MG tablet Take 20 mg by mouth every morning.     Historical Provider, MD   Family History Family History  Problem Relation Age of Onset  . Hypertension Mother   . Diabetes Father    Social History Social History  Substance Use Topics  . Smoking status: Former Smoker    Packs/day: 2.00    Years: 30.00    Types: Cigarettes    Quit date: 04/01/2002  . Smokeless tobacco: Never Used  . Alcohol use No   Allergies   Cephalexin; Ibuprofen; Amoxicillin; Bactrim [sulfamethoxazole-trimethoprim]; Ciprofloxacin; Imitrex [sumatriptan succinate]; Morphine and related; Tetracyclines & related; and Zoloft [sertraline hcl]  Review of Systems Review of Systems  HENT: Positive for ear pain. Negative for hearing loss.   Neurological: Positive for dizziness. Negative for weakness and numbness.  All other systems reviewed and are negative.  Physical Exam Updated Vital Signs BP 165/90 (BP Location: Left Arm)   Pulse 78   Temp 98 F (36.7 C) (Oral)   Resp 18   Ht 5\' 8"  (1.727 m)   Wt 220 lb (99.8 kg)   SpO2 99%   BMI 33.45 kg/m   Physical Exam  Constitutional: She is oriented to person, place, and time. She appears well-developed and well-nourished. No distress.  HENT:  Head: Normocephalic and atraumatic.  Right ear canal is somewhat erythematous. There is pain with manipulation of the ear lobe and speculum insertion. Bilateral TMs are clear.   Eyes: EOM are normal.  Neck: Normal range of motion.  Cardiovascular: Normal rate, regular rhythm and normal heart sounds.   Pulmonary/Chest: Effort normal and breath sounds normal.  Abdominal: Soft. She exhibits no distension. There is no  tenderness.  Musculoskeletal: Normal range of motion.  Neurological: She is alert and oriented to person, place, and time.  Skin: Skin is warm and dry.  Psychiatric: She has a normal mood and affect. Judgment normal.  Nursing note and vitals reviewed.  ED Treatments / Results  DIAGNOSTIC STUDIES: Oxygen Saturation is 99% on RA, normal by my interpretation.   COORDINATION OF CARE: 12:55 PM-Discussed next steps with pt. Pt verbalized understanding and is agreeable with the plan.   Labs (all labs ordered are listed, but only abnormal results are displayed) Labs Reviewed - No data to display  EKG  EKG Interpretation None       Radiology No results found.  Procedures Procedures (including critical care time)  Medications Ordered in ED Medications -  No data to display  Initial Impression / Assessment and Plan / ED Course  I have reviewed the triage vital signs and the nursing notes.  Pertinent labs & imaging results that were available during my care of the patient were reviewed by me and considered in my medical decision making (see chart for details).  Clinical Course   Patient presents with complaints of dizziness that she describes as an off-balance/spinning sensation. She has a history of vertigo and this feels similar. She is out of her meclizine. Her physical examination is unremarkable and neurologic exam is nonfocal. She will be discharged with meclizine and when necessary return. I see no indication for further workup. She does have evidence for an otitis externa which will be treated with Cortisporin.  Final Clinical Impressions(s) / ED Diagnoses   Final diagnoses:  None    New Prescriptions New Prescriptions   No medications on file   I personally performed the services described in this documentation, which was scribed in my presence. The recorded information has been reviewed and is accurate.       Emily Speak, MD 11/21/15 [phone]

## 2015-12-02 ENCOUNTER — Encounter (HOSPITAL_COMMUNITY): Payer: Self-pay

## 2015-12-02 DIAGNOSIS — Z79899 Other long term (current) drug therapy: Secondary | ICD-10-CM | POA: Insufficient documentation

## 2015-12-02 DIAGNOSIS — J029 Acute pharyngitis, unspecified: Secondary | ICD-10-CM | POA: Diagnosis present

## 2015-12-02 DIAGNOSIS — I10 Essential (primary) hypertension: Secondary | ICD-10-CM | POA: Diagnosis not present

## 2015-12-02 DIAGNOSIS — Z87891 Personal history of nicotine dependence: Secondary | ICD-10-CM | POA: Insufficient documentation

## 2015-12-02 DIAGNOSIS — Z792 Long term (current) use of antibiotics: Secondary | ICD-10-CM | POA: Diagnosis not present

## 2015-12-02 DIAGNOSIS — K122 Cellulitis and abscess of mouth: Secondary | ICD-10-CM | POA: Insufficient documentation

## 2015-12-02 DIAGNOSIS — Z791 Long term (current) use of non-steroidal anti-inflammatories (NSAID): Secondary | ICD-10-CM | POA: Insufficient documentation

## 2015-12-02 NOTE — ED Triage Notes (Signed)
Pt reports sore throat x 1 week

## 2015-12-03 ENCOUNTER — Emergency Department (HOSPITAL_COMMUNITY)
Admission: EM | Admit: 2015-12-03 | Discharge: 2015-12-03 | Disposition: A | Discharge status: Home / Self Care | Payer: Medicaid Other | Attending: Emergency Medicine | Admitting: Emergency Medicine

## 2015-12-03 DIAGNOSIS — K122 Cellulitis and abscess of mouth: Secondary | ICD-10-CM

## 2015-12-03 LAB — RAPID STREP SCREEN (MED CTR MEBANE ONLY): Streptococcus, Group A Screen (Direct): NEGATIVE

## 2015-12-03 MED ORDER — PREDNISONE 20 MG PO TABS: ORAL_TABLET | ORAL | 0 refills | Status: DC

## 2015-12-03 MED ORDER — AZITHROMYCIN 250 MG PO TABS: ORAL_TABLET | ORAL | 0 refills | Status: DC

## 2015-12-03 MED ORDER — DEXAMETHASONE SODIUM PHOSPHATE 10 MG/ML IJ SOLN
10.0000 mg | Freq: Once | INTRAMUSCULAR | Status: AC
  Administered 2015-12-03: 10 mg via INTRAMUSCULAR
  Filled 2015-12-03: qty 1

## 2015-12-03 NOTE — Discharge Instructions (Signed)
Take the antibiotic until gone. Try to drink plenty of cold liquids and ice cream which will help with the pain and make it easier to swallow. You can take children's Motrin 600 mg and/or children's acetaminophen 1000 mg every 6 hours as needed for pain. Take the prednisone until gone. Recheck if you are unable to swallow or you have difficulty breathing.

## 2015-12-03 NOTE — ED Notes (Signed)
Pt given cup of water 

## 2015-12-03 NOTE — ED Provider Notes (Signed)
[facility] DEPT Provider Note   CSN: [id number] Arrival date & time: 12/02/15  2350  Time seen 01:28 AM   History   Chief Complaint Chief Complaint  Patient presents with  . Sore Throat    HPI Emily Cooke is a 65 y.o. female.  HPI patient reports swollen painful throat for the past week with subjective fever. She states it's painful to swallow however she is able to swallow. She denies any swelling of her neck, trouble breathing or shortness of breath, she states she feels thirsty but is been drinking less due to the pain. She denies nausea, vomiting, or diarrhea. She states she's had a mild cough. She denies feeling she has something touching her tongue. She states she's tried no medications. Patient states she gets a sore throat about twice a year that they treat with a Z-Pak.   PCP Dr Legrand Rams  Past Medical History:  Diagnosis Date  . Anxiety   . Arthritis   . GERD (gastroesophageal reflux disease)   . Hypertension   . Migraine   . Panic attack   . Panic disorder   . UTI (lower urinary tract infection)   . Vertigo     There are no active problems to display for this patient.   Past Surgical History:  Procedure Laterality Date  . ABDOMINAL HYSTERECTOMY     partial  . BREAST SURGERY      OB History    Gravida Para Term Preterm AB Living   4 3 1 2 1 3    SAB TAB Ectopic Multiple Live Births   1               Home Medications    Prior to Admission medications   Medication Sig Start Date End Date Taking? Authorizing Provider  cyclobenzaprine (FLEXERIL) 10 MG tablet Take 1 tablet (10 mg total) by mouth 2 (two) times daily as needed for muscle spasms. 03/29/15  Yes [name], MD  diphenhydrAMINE (BENADRYL) 25 MG tablet Take 1 tablet (25 mg total) by mouth every 6 (six) hours. 01/08/15  Yes [place], NP  HYDROcodone-acetaminophen (NORCO) 7.5-325 MG tablet Take 1 tablet by mouth every 4 (four) hours as needed. 07/27/15  Yes Lily Kocher, PA-C    hydrOXYzine (ATARAX/VISTARIL) 25 MG tablet Take 1 tablet by mouth three times daily as needed anxiety. 01/12/15  Yes Historical Provider, MD  lisinopril (PRINIVIL,ZESTRIL) 5 MG tablet Take 0.5 tablets (2.5 mg total) by mouth daily. 04/09/14  Yes [name], MD  loratadine (CLARITIN) 10 MG tablet Take 10 mg by mouth daily as needed for allergies.    Yes Historical Provider, MD  meclizine (ANTIVERT) 25 MG tablet Take 1 tablet (25 mg total) by mouth 3 (three) times daily as needed for dizziness. 11/21/15  Yes [name], MD  Miconazole Nitrate (LOTRIMIN AF POWDER) 2 % AERP Apply to right groin tid 11/15/14  Yes Lily Kocher, PA-C  promethazine (PHENERGAN) 12.5 MG tablet Take 1 tablet (12.5 mg total) by mouth every 6 (six) hours as needed for nausea. Patient taking differently: Take 12.5 mg by mouth every 6 (six) hours as needed for nausea (for migraines).  123456  Yes Delora Fuel, MD  propranolol (INDERAL) 10 MG tablet Take 10 mg by mouth at bedtime.     Yes Historical Provider, MD  RABEprazole (ACIPHEX) 20 MG tablet Take 20 mg by mouth every morning.    Yes Historical Provider, MD  acyclovir (ZOVIRAX) 800 MG tablet Take 1 tablet (800  mg total) by mouth 4 (four) times daily. 07/27/15   Lily Kocher, PA-C  azithromycin (ZITHROMAX) 250 MG tablet Take 2 po the first day then once a day for the next 4 days. 12/03/15   Rolland Porter, MD  clindamycin (CLEOCIN) 150 MG capsule Take 1 capsule (150 mg total) by mouth every 6 (six) hours. Patient not taking: Reported on 03/29/2015 01/08/15   Ashley Murrain, NP  ibuprofen (ADVIL,MOTRIN) 600 MG tablet Take 1 tablet (600 mg total) by mouth 4 (four) times daily. 07/27/15   Lily Kocher, PA-C  neomycin-polymyxin-hydrocortisone (CORTISPORIN) 3.5-10000-1 otic suspension Place 4 drops into both ears 4 (four) times daily. X 7 days 11/21/15   [name], MD  predniSONE (DELTASONE) 20 MG tablet Take 3 po QD x 3d , then 2 po QD x 3d then 1 po QD x 3d 12/03/15   Rolland Porter, MD     Family History Family History  Problem Relation Age of Onset  . Hypertension Mother   . Diabetes Father     Social History Social History  Substance Use Topics  . Smoking status: Former Smoker    Packs/day: 2.00    Years: 30.00    Types: Cigarettes    Quit date: 04/01/2002  . Smokeless tobacco: Never Used  . Alcohol use No  on disability for migraines and back spasms   Allergies   Cephalexin; Ibuprofen; Amoxicillin; Bactrim [sulfamethoxazole-trimethoprim]; Ciprofloxacin; Imitrex [sumatriptan succinate]; Morphine and related; Tetracyclines & related; and Zoloft [sertraline hcl]   Review of Systems Review of Systems  All other systems reviewed and are negative.    Physical Exam Updated Vital Signs BP 144/73 (BP Location: Left Arm)   Pulse 81   Temp 98.7 F (37.1 C) (Oral)   Resp 18   Ht 5\' 8"  (1.727 m)   Wt 224 lb (101.6 kg)   SpO2 98%   BMI 34.06 kg/m   Vital signs normal    Physical Exam  Constitutional: She is oriented to person, place, and time. She appears well-developed and well-nourished.  Non-toxic appearance. She does not appear ill. No distress.  HENT:  Head: Normocephalic and atraumatic.  Right Ear: External ear normal.  Left Ear: External ear normal.  Nose: Nose normal. No mucosal edema or rhinorrhea.  Mouth/Throat: Oropharynx is clear and moist and mucous membranes are normal. No dental abscesses or uvula swelling.  Patient is not drooling, when I examine her throat her uvula appears to be read and swollen, the tonsils do not appear to be enlarged, I do not see any exudates, there is no soft palate swelling seen.  Eyes: Conjunctivae and EOM are normal. Pupils are equal, round, and reactive to light.  Neck: Normal range of motion and full passive range of motion without pain. Neck supple.  Her anterior neck has a lot of loose skin however there are no swollen lymph nodes, or masses, there is no asymmetry, there is no thickness to the  subcutaneous tissue, the skin is without erythema. Her family do not report that her neck looks different from normal.  Cardiovascular: Normal rate, regular rhythm and normal heart sounds.  Exam reveals no gallop and no friction rub.   No murmur heard. Pulmonary/Chest: Effort normal and breath sounds normal. No respiratory distress. She has no wheezes. She has no rhonchi. She has no rales. She exhibits no tenderness and no crepitus.  Abdominal: Soft. Normal appearance and bowel sounds are normal. She exhibits no distension. There is no tenderness. There is no  rebound and no guarding.  Musculoskeletal: Normal range of motion. She exhibits no edema or tenderness.  Moves all extremities well.   Neurological: She is alert and oriented to person, place, and time. She has normal strength. No cranial nerve deficit.  Skin: Skin is warm, dry and intact. No rash noted. No erythema. No pallor.  Psychiatric: She has a normal mood and affect. Her speech is normal and behavior is normal. Her mood appears not anxious.  Nursing note and vitals reviewed.    ED Treatments / Results  Labs (all labs ordered are listed, but only abnormal results are displayed) Results for orders placed or performed during the hospital encounter of 12/03/15  Rapid strep screen  Result Value Ref Range   Streptococcus, Group A Screen (Direct) NEGATIVE NEGATIVE      Procedures Procedures (including critical care time)  Medications Ordered in ED Medications  dexamethasone (DECADRON) injection 10 mg (10 mg Intramuscular Given 12/03/15 0148)     Initial Impression / Assessment and Plan / ED Course  I have reviewed the triage vital signs and the nursing notes.  Pertinent labs & imaging results that were available during my care of the patient were reviewed by me and considered in my medical decision making (see chart for details).  Clinical Course   She was given Decadron IM.  Recheck at time of discharge patient states  her throat is feeling better. We discussed she has some inflammation of her uvula or uvulitis. She was discharged home on antibiotics and steroids. She was advised to drink plenty of cold liquids so she does not get dehydrated. She can also take over-the-counter children's liquid ibuprofen or acetaminophen for pain.  Final Clinical Impressions(s) / ED Diagnoses   Final diagnoses:  Uvulitis    New Prescriptions New Prescriptions   AZITHROMYCIN (ZITHROMAX) 250 MG TABLET    Take 2 po the first day then once a day for the next 4 days.   PREDNISONE (DELTASONE) 20 MG TABLET    Take 3 po QD x 3d , then 2 po QD x 3d then 1 po QD x 3d    Plan discharge  Rolland Porter, MD, Barbette Or, MD 12/03/15 [phone]

## 2015-12-03 NOTE — ED Notes (Signed)
Pt's family member out at the desk, ready for discharge

## 2015-12-05 LAB — CULTURE, GROUP A STREP (THRC)

## 2016-01-04 ENCOUNTER — Emergency Department (HOSPITAL_COMMUNITY)
Admission: EM | Admit: 2016-01-04 | Discharge: 2016-01-04 | Disposition: A | Discharge status: Home / Self Care | Payer: Medicaid Other | Attending: Emergency Medicine | Admitting: Emergency Medicine

## 2016-01-04 ENCOUNTER — Encounter (HOSPITAL_COMMUNITY): Payer: Self-pay | Admitting: Emergency Medicine

## 2016-01-04 DIAGNOSIS — Z792 Long term (current) use of antibiotics: Secondary | ICD-10-CM | POA: Insufficient documentation

## 2016-01-04 DIAGNOSIS — R300 Dysuria: Secondary | ICD-10-CM

## 2016-01-04 DIAGNOSIS — I10 Essential (primary) hypertension: Secondary | ICD-10-CM | POA: Diagnosis not present

## 2016-01-04 DIAGNOSIS — Z79899 Other long term (current) drug therapy: Secondary | ICD-10-CM | POA: Diagnosis not present

## 2016-01-04 DIAGNOSIS — Z87891 Personal history of nicotine dependence: Secondary | ICD-10-CM | POA: Diagnosis not present

## 2016-01-04 LAB — URINALYSIS, ROUTINE W REFLEX MICROSCOPIC
Bilirubin Urine: NEGATIVE
Glucose, UA: NEGATIVE mg/dL
Hgb urine dipstick: NEGATIVE
Ketones, ur: NEGATIVE mg/dL
Nitrite: NEGATIVE
Protein, ur: NEGATIVE mg/dL
Specific Gravity, Urine: 1.02 (ref 1.005–1.030)
pH: 6 (ref 5.0–8.0)

## 2016-01-04 LAB — WET PREP, GENITAL
CLUE CELLS WET PREP: NONE SEEN
Sperm: NONE SEEN
TRICH WET PREP: NONE SEEN
YEAST WET PREP: NONE SEEN

## 2016-01-04 LAB — URINE MICROSCOPIC-ADD ON: RBC / HPF: NONE SEEN RBC/hpf (ref 0–5)

## 2016-01-04 MED ORDER — ONDANSETRON HCL 4 MG/2ML IJ SOLN: 4.0000 mg | Freq: Once | INTRAMUSCULAR | Status: DC

## 2016-01-04 MED ORDER — LORAZEPAM 2 MG/ML IJ SOLN: 1.0000 mg | Freq: Once | INTRAMUSCULAR | Status: DC

## 2016-01-04 MED ORDER — NITROFURANTOIN MONOHYD MACRO 100 MG PO CAPS: 100.0000 mg | ORAL_CAPSULE | Freq: Two times a day (BID) | ORAL | 0 refills | Status: DC

## 2016-01-04 NOTE — ED Triage Notes (Signed)
Pt states she is having burning with urination that started yesterday.

## 2016-01-04 NOTE — Discharge Instructions (Signed)
Take antibiotic twice daily for 5 days

## 2016-01-04 NOTE — ED Provider Notes (Signed)
[facility] DEPT Provider Note   CSN: [id number] Arrival date & time: 01/04/16  2055     History   Chief Complaint Chief Complaint  Patient presents with  . Burning with urination    HPI Emily Cooke is a 65 y.o. female who presents with dysuria. Past hx significant for partial hysterectomy. She states that yesterday she started to have burning with urination. She states she has had a UTI in the past but it's been "a long time". She has been with the same partner for 2 years but just had sex with that partner for the first time 3 weeks ago and it was unprotected. She also reports vaginal itching and a clear discharge which she is unsure if it is from the vagina or not. She denies fever, chills, abdominal pain, N/V/D.  HPI  Past Medical History:  Diagnosis Date  . Anxiety   . Arthritis   . GERD (gastroesophageal reflux disease)   . Hypertension   . Migraine   . Panic attack   . Panic disorder   . UTI (lower urinary tract infection)   . Vertigo     There are no active problems to display for this patient.   Past Surgical History:  Procedure Laterality Date  . ABDOMINAL HYSTERECTOMY     partial  . BREAST SURGERY      OB History    Gravida Para Term Preterm AB Living   4 3 1 2 1 3    SAB TAB Ectopic Multiple Live Births   1               Home Medications    Prior to Admission medications   Medication Sig Start Date End Date Taking? Authorizing Provider  acyclovir (ZOVIRAX) 800 MG tablet Take 1 tablet (800 mg total) by mouth 4 (four) times daily. 07/27/15   Lily Kocher, PA-C  azithromycin (ZITHROMAX) 250 MG tablet Take 2 po the first day then once a day for the next 4 days. 12/03/15   Rolland Porter, MD  clindamycin (CLEOCIN) 150 MG capsule Take 1 capsule (150 mg total) by mouth every 6 (six) hours. Patient not taking: Reported on 03/29/2015 01/08/15   Ashley Murrain, NP  cyclobenzaprine (FLEXERIL) 10 MG tablet Take 1 tablet (10 mg total) by mouth 2 (two) times  daily as needed for muscle spasms. 03/29/15   [name], MD  diphenhydrAMINE (BENADRYL) 25 MG tablet Take 1 tablet (25 mg total) by mouth every 6 (six) hours. 01/08/15   Hope Bunnie Pion, NP  HYDROcodone-acetaminophen (NORCO) 7.5-325 MG tablet Take 1 tablet by mouth every 4 (four) hours as needed. 07/27/15   Lily Kocher, PA-C  hydrOXYzine (ATARAX/VISTARIL) 25 MG tablet Take 1 tablet by mouth three times daily as needed anxiety. 01/12/15   Historical Provider, MD  ibuprofen (ADVIL,MOTRIN) 600 MG tablet Take 1 tablet (600 mg total) by mouth 4 (four) times daily. 07/27/15   Lily Kocher, PA-C  lisinopril (PRINIVIL,ZESTRIL) 5 MG tablet Take 0.5 tablets (2.5 mg total) by mouth daily. 04/09/14   [name], MD  loratadine (CLARITIN) 10 MG tablet Take 10 mg by mouth daily as needed for allergies.     Historical Provider, MD  meclizine (ANTIVERT) 25 MG tablet Take 1 tablet (25 mg total) by mouth 3 (three) times daily as needed for dizziness. 11/21/15   [name], MD  Miconazole Nitrate (LOTRIMIN AF POWDER) 2 % AERP Apply to right groin tid 11/15/14   Lily Kocher, PA-C  neomycin-polymyxin-hydrocortisone (CORTISPORIN)  3.5-10000-1 otic suspension Place 4 drops into both ears 4 (four) times daily. X 7 days 11/21/15   [name], MD  predniSONE (DELTASONE) 20 MG tablet Take 3 po QD x 3d , then 2 po QD x 3d then 1 po QD x 3d 12/03/15   Rolland Porter, MD  promethazine (PHENERGAN) 12.5 MG tablet Take 1 tablet (12.5 mg total) by mouth every 6 (six) hours as needed for nausea. Patient taking differently: Take 12.5 mg by mouth every 6 (six) hours as needed for nausea (for migraines).  [id number]   Delora Fuel, MD  propranolol (INDERAL) 10 MG tablet Take 10 mg by mouth at bedtime.      Historical Provider, MD  RABEprazole (ACIPHEX) 20 MG tablet Take 20 mg by mouth every morning.     Historical Provider, MD    Family History Family History  Problem Relation Age of Onset  . Hypertension Mother   . Diabetes Father      Social History Social History  Substance Use Topics  . Smoking status: Former Smoker    Packs/day: 2.00    Years: 30.00    Types: Cigarettes    Quit date: 04/01/2002  . Smokeless tobacco: Never Used  . Alcohol use No     Allergies   Cephalexin; Ibuprofen; Amoxicillin; Bactrim [sulfamethoxazole-trimethoprim]; Ciprofloxacin; Imitrex [sumatriptan succinate]; Morphine and related; Tetracyclines & related; and Zoloft [sertraline hcl]   Review of Systems Review of Systems  Constitutional: Negative for chills and fever.  Gastrointestinal: Negative for abdominal pain, diarrhea, nausea and vomiting.  Genitourinary: Positive for dysuria and vaginal discharge. Negative for pelvic pain.  All other systems reviewed and are negative.    Physical Exam Updated Vital Signs BP 159/70 (BP Location: Left Arm)   Pulse 73   Temp 97.8 F (36.6 C) (Oral)   Resp 18   Ht 5\' 8"  (1.727 m)   Wt 101.2 kg   SpO2 98%   BMI 33.91 kg/m   Physical Exam  Constitutional: She is oriented to person, place, and time. She appears well-developed and well-nourished. No distress.  HENT:  Head: Normocephalic and atraumatic.  Eyes: Conjunctivae are normal. Pupils are equal, round, and reactive to light. Right eye exhibits no discharge. Left eye exhibits no discharge. No scleral icterus.  Neck: Normal range of motion. Neck supple.  Cardiovascular: Normal rate and regular rhythm.   No murmur heard. Pulmonary/Chest: Effort normal and breath sounds normal. No respiratory distress.  Abdominal: Soft. Bowel sounds are normal. She exhibits no distension and no mass. There is no tenderness. There is no rebound and no guarding. No hernia.  Genitourinary:  Genitourinary Comments: No inguinal lymphadenopathy or inguinal hernia noted. Normal external genitalia. No pain with speculum insertion. Cervix is surgically absent. There is a small amount of clear discharge in the vaginal vault. No rash or lesions. Chaperone  present during exam.    Musculoskeletal: She exhibits no edema.  Neurological: She is alert and oriented to person, place, and time.  Skin: Skin is warm and dry.  Psychiatric: She has a normal mood and affect. Her behavior is normal.  Nursing note and vitals reviewed.    ED Treatments / Results  Labs (all labs ordered are listed, but only abnormal results are displayed) Labs Reviewed  WET PREP, GENITAL - Abnormal; Notable for the following:       Result Value   WBC, Wet Prep HPF POC MANY (*)    All other components within normal limits  URINALYSIS, ROUTINE W  REFLEX MICROSCOPIC (NOT AT [facility]) - Abnormal; Notable for the following:    Leukocytes, UA SMALL (*)    All other components within normal limits  URINE MICROSCOPIC-ADD ON - Abnormal; Notable for the following:    Squamous Epithelial / LPF 0-5 (*)    Bacteria, UA FEW (*)    All other components within normal limits  URINE CULTURE  GC/CHLAMYDIA PROBE AMP ([place]) NOT AT [facility]    EKG  EKG Interpretation None       Radiology No results found.  Procedures Procedures (including critical care time)  Medications Ordered in ED Medications - No data to display   Initial Impression / Assessment and Plan / ED Course  I have reviewed the triage vital signs and the nursing notes.  Pertinent labs & imaging results that were available during my care of the patient were reviewed by me and considered in my medical decision making (see chart for details).  Clinical Course   65 year old female presents with dysuria possibly due to UTI. UA has few bacteria, small leukocytes, with 6-30 WBC. Culture sent. Pelvic exam is unremarkable. Wet prep remarkable for many WBC. G&C culture sent. Advised patient that culture is pending and she would be called for positive results. Rx for Macrobid given due to patient's many allergies. Patient is NAD, non-toxic, with stable VS. Patient is informed of clinical course, understands medical  decision making process, and agrees with plan. Opportunity for questions provided and all questions answered. Return precautions given.   Final Clinical Impressions(s) / ED Diagnoses   Final diagnoses:  Dysuria    New Prescriptions Discharge Medication List as of 01/04/2016 10:49 PM    START taking these medications   Details  nitrofurantoin, macrocrystal-monohydrate, (MACROBID) 100 MG capsule Take 1 capsule (100 mg total) by mouth 2 (two) times daily., Starting Mon 01/04/2016, Print         Recardo Evangelist, PA-C 01/04/16 2255    Virgel Manifold, MD 01/11/16 [phone]

## 2016-01-06 LAB — URINE CULTURE

## 2016-01-06 LAB — GC/CHLAMYDIA PROBE AMP (~~LOC~~) NOT AT ARMC
CHLAMYDIA, DNA PROBE: NEGATIVE
Neisseria Gonorrhea: NEGATIVE

## 2016-02-14 IMAGING — CR DG CHEST 2V
2 series · 2 of 2 positions shown · non-contrast
Comparison: 05/19/2013

CLINICAL DATA: Chest pain

EXAM:
CHEST  2 VIEW

[view not recorded (1 of 2)]
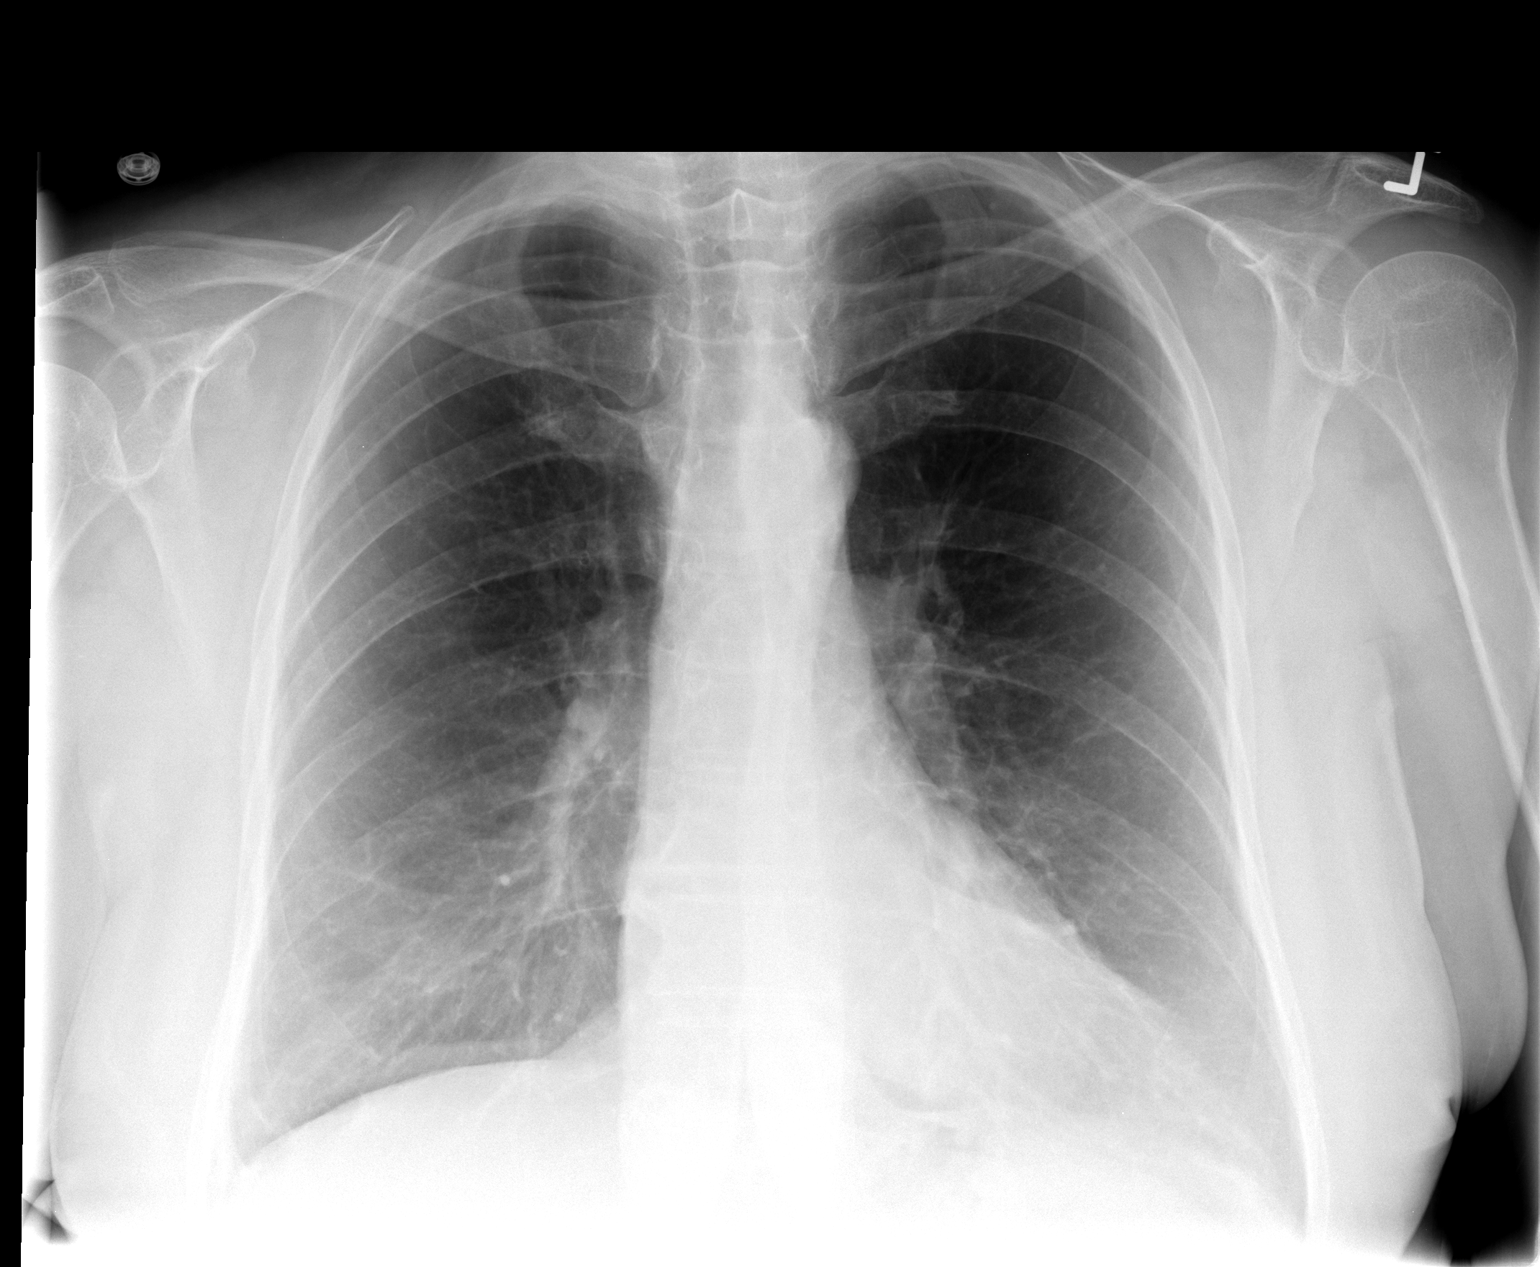

[view not recorded (2 of 2)]
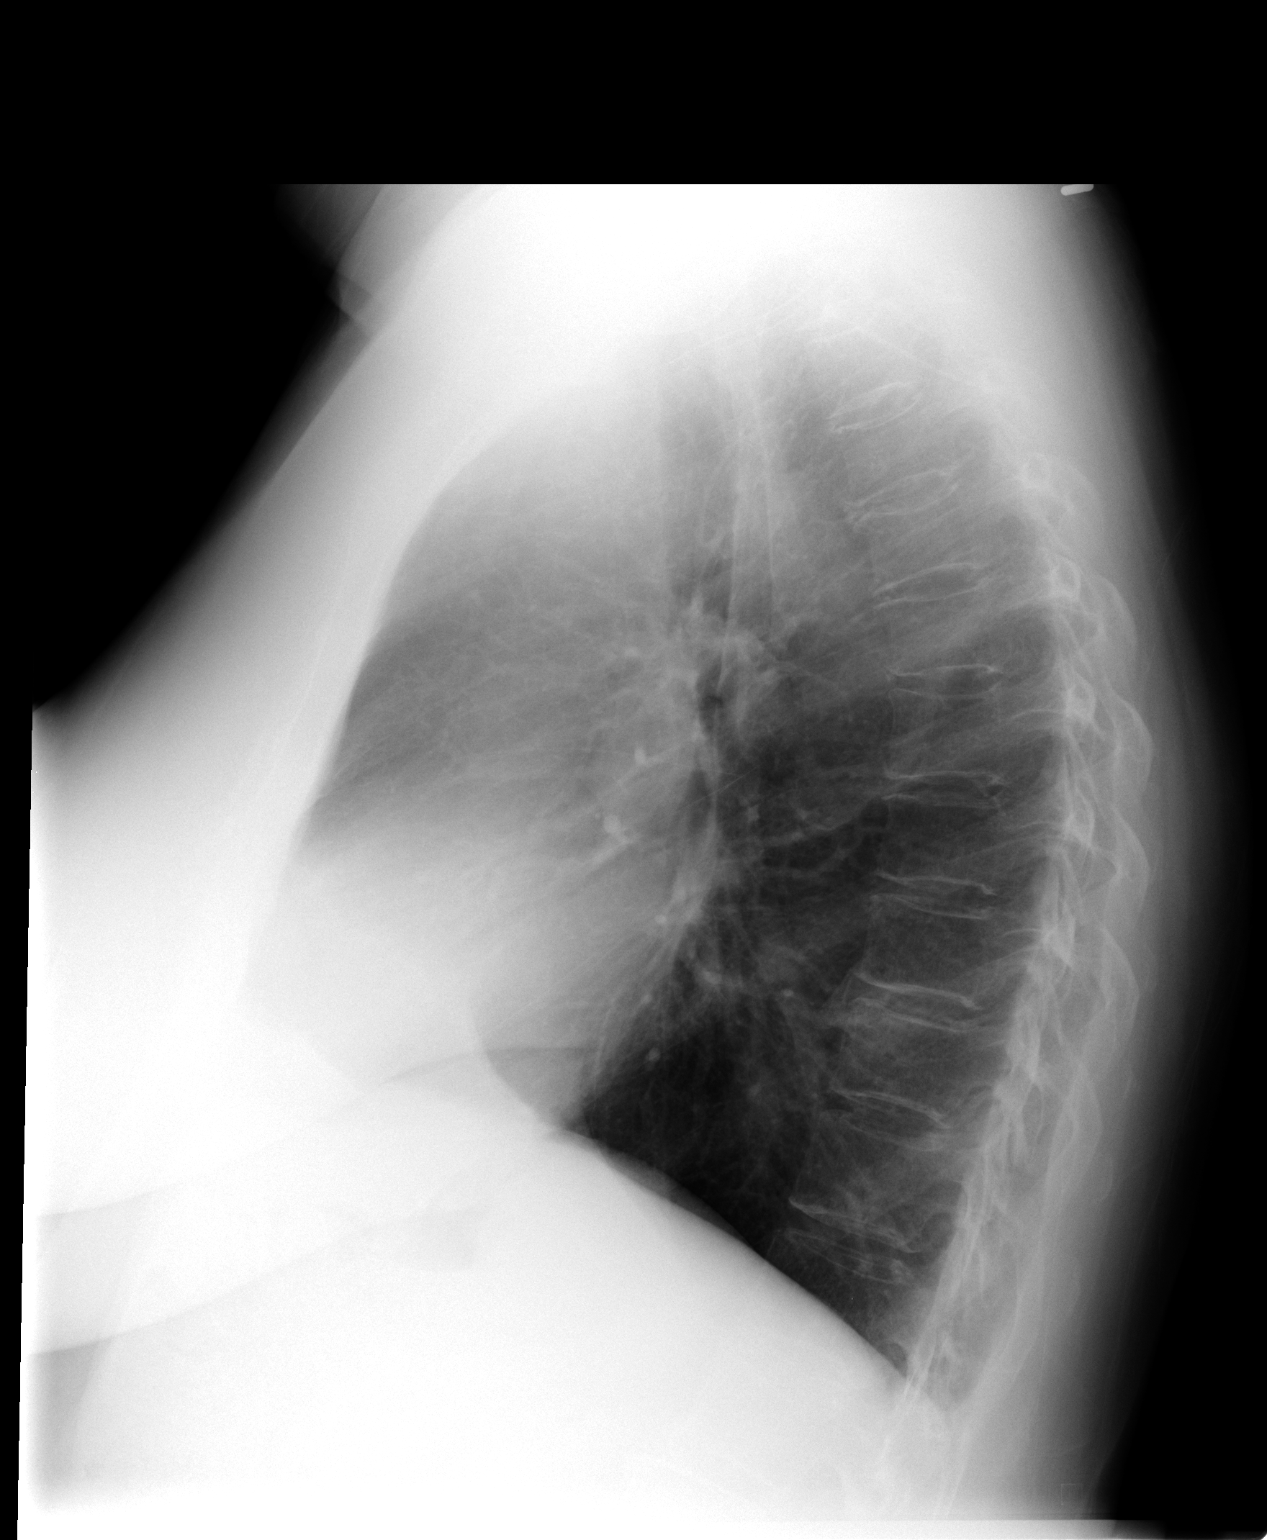

[2 of 2 positions shown; findings below may reference images not displayed]

FINDINGS: Lungs are hyper aerated and clear. No pneumothorax or pleural
effusion. Upper normal heart size.
IMPRESSION: Changes related to COPD.  No active disease.

## 2016-03-19 ENCOUNTER — Encounter (HOSPITAL_COMMUNITY): Payer: Self-pay | Admitting: Emergency Medicine

## 2016-03-19 ENCOUNTER — Emergency Department (HOSPITAL_COMMUNITY)
Admission: EM | Admit: 2016-03-19 | Discharge: 2016-03-19 | Disposition: A | Discharge status: Home / Self Care | Payer: Medicaid Other | Attending: Emergency Medicine | Admitting: Emergency Medicine

## 2016-03-19 DIAGNOSIS — I10 Essential (primary) hypertension: Secondary | ICD-10-CM | POA: Diagnosis not present

## 2016-03-19 DIAGNOSIS — Z79899 Other long term (current) drug therapy: Secondary | ICD-10-CM | POA: Insufficient documentation

## 2016-03-19 DIAGNOSIS — J029 Acute pharyngitis, unspecified: Secondary | ICD-10-CM | POA: Insufficient documentation

## 2016-03-19 DIAGNOSIS — Z87891 Personal history of nicotine dependence: Secondary | ICD-10-CM | POA: Insufficient documentation

## 2016-03-19 MED ORDER — MAGIC MOUTHWASH W/LIDOCAINE: 5.0000 mL | Freq: Three times a day (TID) | ORAL | 0 refills | Status: DC | PRN

## 2016-03-19 MED ORDER — AZITHROMYCIN 250 MG PO TABS: ORAL_TABLET | ORAL | 0 refills | Status: DC

## 2016-03-19 NOTE — Discharge Instructions (Signed)
Drink plenty of fluids.  Tylenol every 4 hrs for fever and/of body aches.  Follow-up with your doctor for recheck if needed

## 2016-03-19 NOTE — ED Triage Notes (Signed)
Pt reports runny nose and eyes and sore throat since last Sunday.  Reports fever at home.

## 2016-03-19 NOTE — ED Provider Notes (Signed)
[facility] DEPT Provider Note   CSN: [id number] Arrival date & time: 03/19/16  [id number]     History   Chief Complaint Chief Complaint  Patient presents with  . Sore Throat    HPI Emily Cooke is a 66 y.o. female.  HPI   .Emily Cooke is a 66 y.o. female who presents to the Emergency Department complaining of sore throat for one week.  Her symptoms are associated with fever, chills, runny nose and loss of her voice. She denies cough, chest pain, shortness of breath, headache and rash.  She has been taking tylenol with minimal relief.  Possible sick contacts.    Past Medical History:  Diagnosis Date  . Anxiety   . Arthritis   . GERD (gastroesophageal reflux disease)   . Hypertension   . Migraine   . Panic attack   . Panic disorder   . UTI (lower urinary tract infection)   . Vertigo     There are no active problems to display for this patient.   Past Surgical History:  Procedure Laterality Date  . ABDOMINAL HYSTERECTOMY     partial  . BREAST SURGERY      OB History    Gravida Para Term Preterm AB Living   4 3 1 2 1 3    SAB TAB Ectopic Multiple Live Births   1               Home Medications    Prior to Admission medications   Medication Sig Start Date End Date Taking? Authorizing Provider  cyclobenzaprine (FLEXERIL) 10 MG tablet Take 1 tablet (10 mg total) by mouth 2 (two) times daily as needed for muscle spasms. 03/29/15   [name], MD  hydrOXYzine (ATARAX/VISTARIL) 25 MG tablet Take 1 tablet by mouth three times daily as needed anxiety. 01/12/15   Historical Provider, MD  lisinopril (PRINIVIL,ZESTRIL) 5 MG tablet Take 0.5 tablets (2.5 mg total) by mouth daily. 04/09/14   [name], MD  meclizine (ANTIVERT) 25 MG tablet Take 1 tablet (25 mg total) by mouth 3 (three) times daily as needed for dizziness. 11/21/15   [name], MD  nitrofurantoin, macrocrystal-monohydrate, (MACROBID) 100 MG capsule Take 1 capsule (100 mg total) by mouth 2 (two)  times daily. 01/04/16   Recardo Evangelist, PA-C  propranolol (INDERAL) 10 MG tablet Take 10 mg by mouth at bedtime.      Historical Provider, MD  RABEprazole (ACIPHEX) 20 MG tablet Take 20 mg by mouth every morning.     Historical Provider, MD    Family History Family History  Problem Relation Age of Onset  . Hypertension Mother   . Diabetes Father     Social History Social History  Substance Use Topics  . Smoking status: Former Smoker    Packs/day: 2.00    Years: 30.00    Types: Cigarettes    Quit date: 04/01/2002  . Smokeless tobacco: Never Used  . Alcohol use No     Allergies   Cephalexin; Ibuprofen; Amoxicillin; Bactrim [sulfamethoxazole-trimethoprim]; Ciprofloxacin; Imitrex [sumatriptan succinate]; Morphine and related; Tetracyclines & related; and Zoloft [sertraline hcl]   Review of Systems Review of Systems  Constitutional: Positive for chills and fever. Negative for activity change and appetite change.  HENT: Positive for congestion, rhinorrhea and sore throat. Negative for ear pain, facial swelling, trouble swallowing and voice change.   Eyes: Negative for pain and visual disturbance.  Respiratory: Negative for cough and shortness of breath.   Gastrointestinal: Negative for  abdominal pain, nausea and vomiting.  Genitourinary: Negative for dysuria.  Musculoskeletal: Negative for arthralgias, neck pain and neck stiffness.  Skin: Negative for color change and rash.  Neurological: Negative for dizziness, facial asymmetry, speech difficulty, numbness and headaches.  Hematological: Negative for adenopathy.  All other systems reviewed and are negative.    Physical Exam Updated Vital Signs BP 145/78 (BP Location: Left Arm)   Pulse 109   Temp 98.3 F (36.8 C) (Oral)   Resp 16   Ht 5\' 8"  (1.727 m)   Wt 99.8 kg   SpO2 98%   BMI 33.45 kg/m   Physical Exam  Constitutional: She is oriented to person, place, and time. She appears well-developed and well-nourished.  No distress.  HENT:  Head: Normocephalic and atraumatic.  Right Ear: Tympanic membrane and ear canal normal.  Left Ear: Tympanic membrane and ear canal normal.  Mouth/Throat: Uvula is midline and mucous membranes are normal. No trismus in the jaw. No uvula swelling. Posterior oropharyngeal edema and posterior oropharyngeal erythema present. No oropharyngeal exudate or tonsillar abscesses. Tonsils are 1+ on the right. Tonsils are 1+ on the left. No tonsillar exudate.  Erythema and mild edema of the posterior pharynx.  Uvula is midline and non-edematous.  No exudate  Neck: Normal range of motion. Neck supple.  Cardiovascular: Normal rate, regular rhythm and normal heart sounds.   Pulmonary/Chest: Effort normal and breath sounds normal. No respiratory distress.  Abdominal: There is no splenomegaly. There is no tenderness.  Musculoskeletal: Normal range of motion.  Lymphadenopathy:    She has cervical adenopathy.  Neurological: She is alert and oriented to person, place, and time. She exhibits normal muscle tone. Coordination normal.  Skin: Skin is warm and dry.  Nursing note and vitals reviewed.    ED Treatments / Results  Labs (all labs ordered are listed, but only abnormal results are displayed) Labs Reviewed - No data to display  EKG  EKG Interpretation None       Radiology No results found.  Procedures Procedures (including critical care time)  Medications Ordered in ED Medications - No data to display   Initial Impression / Assessment and Plan / ED Course  I have reviewed the triage vital signs and the nursing notes.  Pertinent labs & imaging results that were available during my care of the patient were reviewed by me and considered in my medical decision making (see chart for details).  Clinical Course    Pt is non-toxic appearing.  Vitals stable.  Likely pharyngitis, no concerning sx's for PTA.  Airway patent.  Appears stable for d/c.    Rx for zithromax, magic  mouthwash.  PCP f/u if needed.   Final Clinical Impressions(s) / ED Diagnoses   Final diagnoses:  Pharyngitis, unspecified etiology    New Prescriptions New Prescriptions   No medications on file     Kem Parkinson, PA-C 03/19/16 [place], MD 03/20/16 [phone]

## 2016-04-01 ENCOUNTER — Emergency Department (HOSPITAL_COMMUNITY)
Admission: EM | Admit: 2016-04-01 | Discharge: 2016-04-01 | Disposition: A | Discharge status: Home / Self Care | Payer: Medicaid Other | Attending: Emergency Medicine | Admitting: Emergency Medicine

## 2016-04-01 ENCOUNTER — Emergency Department (HOSPITAL_COMMUNITY): Payer: Medicaid Other

## 2016-04-01 ENCOUNTER — Encounter (HOSPITAL_COMMUNITY): Payer: Self-pay | Admitting: Emergency Medicine

## 2016-04-01 DIAGNOSIS — J111 Influenza due to unidentified influenza virus with other respiratory manifestations: Secondary | ICD-10-CM

## 2016-04-01 DIAGNOSIS — R69 Illness, unspecified: Secondary | ICD-10-CM

## 2016-04-01 DIAGNOSIS — R062 Wheezing: Secondary | ICD-10-CM | POA: Insufficient documentation

## 2016-04-01 DIAGNOSIS — Z79899 Other long term (current) drug therapy: Secondary | ICD-10-CM | POA: Diagnosis not present

## 2016-04-01 DIAGNOSIS — I10 Essential (primary) hypertension: Secondary | ICD-10-CM | POA: Diagnosis not present

## 2016-04-01 DIAGNOSIS — M791 Myalgia: Secondary | ICD-10-CM | POA: Insufficient documentation

## 2016-04-01 DIAGNOSIS — J029 Acute pharyngitis, unspecified: Secondary | ICD-10-CM | POA: Diagnosis not present

## 2016-04-01 DIAGNOSIS — R05 Cough: Secondary | ICD-10-CM | POA: Diagnosis present

## 2016-04-01 DIAGNOSIS — Z87891 Personal history of nicotine dependence: Secondary | ICD-10-CM | POA: Diagnosis not present

## 2016-04-01 DIAGNOSIS — R509 Fever, unspecified: Secondary | ICD-10-CM | POA: Diagnosis not present

## 2016-04-01 MED ORDER — ONDANSETRON 4 MG PO TBDP
4.0000 mg | ORAL_TABLET | Freq: Once | ORAL | Status: AC
  Administered 2016-04-01: 4 mg via ORAL
  Filled 2016-04-01: qty 1

## 2016-04-01 MED ORDER — ACETAMINOPHEN 325 MG PO TABS: 650.0000 mg | ORAL_TABLET | Freq: Once | ORAL | Status: DC

## 2016-04-01 MED ORDER — ACETAMINOPHEN 325 MG PO TABS
650.0000 mg | ORAL_TABLET | Freq: Once | ORAL | Status: AC
  Administered 2016-04-01: 650 mg via ORAL
  Filled 2016-04-01: qty 2

## 2016-04-01 MED ORDER — ONDANSETRON HCL 4 MG PO TABS: 4.0000 mg | ORAL_TABLET | Freq: Four times a day (QID) | ORAL | 0 refills | Status: DC

## 2016-04-01 NOTE — ED Triage Notes (Signed)
Pt c/o chills, fever and cough that started this am.

## 2016-04-01 NOTE — ED Provider Notes (Signed)
[facility] DEPT Provider Note   CSN: [id number] Arrival date & time: 04/01/16  0026     History   Chief Complaint Chief Complaint  Patient presents with  . Cough    HPI Emily Cooke is a 66 y.o. female.  HPI  SUBJECTIVE:  Emily Cooke is a 66 y.o. female who complains of congestion, sore throat, nasal blockage, post nasal drip, productive cough, myalgias, fever and chills for 1 days. She denies a history of anorexia, chest pain, sweats and vomiting and denies a history of asthma. Patient denies smoke cigarettes.     Past Medical History:  Diagnosis Date  . Anxiety   . Arthritis   . GERD (gastroesophageal reflux disease)   . Hypertension   . Migraine   . Panic attack   . Panic disorder   . UTI (lower urinary tract infection)   . Vertigo     There are no active problems to display for this patient.   Past Surgical History:  Procedure Laterality Date  . ABDOMINAL HYSTERECTOMY     partial  . BREAST SURGERY      OB History    Gravida Para Term Preterm AB Living   4 3 1 2 1 3    SAB TAB Ectopic Multiple Live Births   1               Home Medications    Prior to Admission medications   Medication Sig Start Date End Date Taking? Authorizing Provider  cyclobenzaprine (FLEXERIL) 10 MG tablet Take 1 tablet (10 mg total) by mouth 2 (two) times daily as needed for muscle spasms. 03/29/15  Yes [name], MD  hydrOXYzine (ATARAX/VISTARIL) 25 MG tablet Take 1 tablet by mouth three times daily as needed anxiety. 01/12/15  Yes Historical Provider, MD  lisinopril (PRINIVIL,ZESTRIL) 5 MG tablet Take 0.5 tablets (2.5 mg total) by mouth daily. 04/09/14  Yes [name], MD  meclizine (ANTIVERT) 25 MG tablet Take 1 tablet (25 mg total) by mouth 3 (three) times daily as needed for dizziness. 11/21/15  Yes [name], MD  propranolol (INDERAL) 10 MG tablet Take 10 mg by mouth at bedtime.     Yes Historical Provider, MD  RABEprazole (ACIPHEX) 20 MG tablet Take  20 mg by mouth every morning.    Yes Historical Provider, MD  azithromycin (ZITHROMAX) 250 MG tablet Take first 2 tablets together, then 1 every day until finished. 03/19/16   Tammy Triplett, PA-C  magic mouthwash w/lidocaine SOLN Take 5 mLs by mouth 3 (three) times daily as needed for mouth pain. Swish and spit, do not swallow 03/19/16   Tammy Triplett, PA-C  nitrofurantoin, macrocrystal-monohydrate, (MACROBID) 100 MG capsule Take 1 capsule (100 mg total) by mouth 2 (two) times daily. 01/04/16   Recardo Evangelist, PA-C  ondansetron (ZOFRAN) 4 MG tablet Take 1 tablet (4 mg total) by mouth every 6 (six) hours. 04/01/16   [name], MD    Family History Family History  Problem Relation Age of Onset  . Hypertension Mother   . Diabetes Father     Social History Social History  Substance Use Topics  . Smoking status: Former Smoker    Packs/day: 2.00    Years: 30.00    Types: Cigarettes    Quit date: 04/01/2002  . Smokeless tobacco: Never Used  . Alcohol use No     Allergies   Cephalexin; Ibuprofen; Amoxicillin; Bactrim [sulfamethoxazole-trimethoprim]; Ciprofloxacin; Imitrex [sumatriptan succinate]; Morphine and related; Tetracyclines & related; and  Zoloft [sertraline hcl]   Review of Systems Review of Systems  Constitutional: Positive for activity change.  HENT: Positive for postnasal drip.   Respiratory: Positive for cough and wheezing. Negative for shortness of breath.   Cardiovascular: Negative for chest pain.  Gastrointestinal: Negative for abdominal pain, nausea and vomiting.  Genitourinary: Negative for dysuria.  Musculoskeletal: Negative for neck pain.  Skin: Negative for rash.  Neurological: Positive for headaches.     Physical Exam Updated Vital Signs BP 143/55   Pulse 94   Temp 98.6 F (37 C)   Resp 18   Ht 5\' 8"  (1.727 m)   Wt 220 lb (99.8 kg)   SpO2 97%   BMI 33.45 kg/m   Physical Exam  Constitutional: She is oriented to person, place, and time. She  appears well-developed and well-nourished.  HENT:  Head: Normocephalic and atraumatic.  Eyes: EOM are normal. Pupils are equal, round, and reactive to light.  Neck: Neck supple.  Cardiovascular: Normal rate, regular rhythm, normal heart sounds and intact distal pulses.   No murmur heard. Pulmonary/Chest: Effort normal. No respiratory distress. She has no wheezes.  Abdominal: Soft. She exhibits no distension. There is no tenderness. There is no rebound and no guarding.  Neurological: She is alert and oriented to person, place, and time.  Skin: Skin is warm and dry.  Nursing note and vitals reviewed.   ED Treatments / Results  Labs (all labs ordered are listed, but only abnormal results are displayed) Labs Reviewed - No data to display  EKG  EKG Interpretation None       Radiology Dg Chest 2 View  Result Date: 04/01/2016 CLINICAL DATA:  Cough, fever and chills since yesterday EXAM: CHEST  2 VIEW COMPARISON:  04/25/2014 FINDINGS: Hyperinflated lungs upper lobe predominant. No pneumonic consolidation, effusion or pneumothorax. No pulmonary edema. Heart and mediastinal contours are stable. No acute osseous abnormality. IMPRESSION: Emphysematous hyperinflation of the lungs upper lobe predominant. No acute pulmonary disease. Electronically Signed   By: Ashley Royalty M.D.   On: 04/01/2016 02:16    Procedures Procedures (including critical care time)  Medications Ordered in ED Medications  acetaminophen (TYLENOL) tablet 650 mg (650 mg Oral Given 04/01/16 0045)  ondansetron (ZOFRAN-ODT) disintegrating tablet 4 mg (4 mg Oral Given 04/01/16 0152)     Initial Impression / Assessment and Plan / ED Course  I have reviewed the triage vital signs and the nursing notes.  Pertinent labs & imaging results that were available during my care of the patient were reviewed by me and considered in my medical decision making (see chart for details).     OBJECTIVE: She appears well, vital signs are  as noted. Ears normal.  Throat and pharynx normal.  Neck supple. No adenopathy in the neck. Nose is congested. Sinuses non tender. The chest is clear, without wheezes or rales.  ASSESSMENT:  viral upper respiratory illness and likely influenza  PLAN: Symptomatic therapy suggested: push fluids, rest and return office visit prn if symptoms persist or worsen. Lack of antibiotic effectiveness discussed with her. Call or return to clinic prn if these symptoms worsen or fail to improve as anticipated.  We also discussed tamiflu and it's benefits and the fact that it is not curative. Pt declined the rx.  Strict ER return precautions have been discussed, and patient is agreeing with the plan and is comfortable with the workup done and the recommendations from the ER.   Final Clinical Impressions(s) / ED Diagnoses  Final diagnoses:  Influenza-like illness    New Prescriptions Discharge Medication List as of 04/01/2016  3:19 AM    START taking these medications   Details  ondansetron (ZOFRAN) 4 MG tablet Take 1 tablet (4 mg total) by mouth every 6 (six) hours., Starting Fri 04/01/2016, Print         [name], MD 04/01/16 [phone]

## 2016-04-01 NOTE — Discharge Instructions (Signed)
You likely have a flu. Please take tylenol for pain and aches and fevers. Please take zofran for nausea.  Hydrate well. Please return to the ER if your symptoms worsen; you have increased pain, fevers, chills, inability to keep any medications down, confusion. Otherwise see the outpatient doctor as requested.

## 2016-04-26 DIAGNOSIS — F4310 Post-traumatic stress disorder, unspecified: Secondary | ICD-10-CM | POA: Diagnosis not present

## 2016-05-28 ENCOUNTER — Emergency Department (HOSPITAL_COMMUNITY)
Admission: EM | Admit: 2016-05-28 | Discharge: 2016-05-28 | Disposition: A | Discharge status: Home / Self Care | Payer: Medicare Other | Attending: Emergency Medicine | Admitting: Emergency Medicine

## 2016-05-28 ENCOUNTER — Encounter (HOSPITAL_COMMUNITY): Payer: Self-pay

## 2016-05-28 ENCOUNTER — Emergency Department (HOSPITAL_COMMUNITY): Payer: Medicare Other

## 2016-05-28 DIAGNOSIS — R109 Unspecified abdominal pain: Secondary | ICD-10-CM | POA: Diagnosis not present

## 2016-05-28 DIAGNOSIS — Z87891 Personal history of nicotine dependence: Secondary | ICD-10-CM | POA: Insufficient documentation

## 2016-05-28 DIAGNOSIS — I10 Essential (primary) hypertension: Secondary | ICD-10-CM | POA: Insufficient documentation

## 2016-05-28 DIAGNOSIS — R1032 Left lower quadrant pain: Secondary | ICD-10-CM | POA: Diagnosis present

## 2016-05-28 DIAGNOSIS — K654 Sclerosing mesenteritis: Secondary | ICD-10-CM | POA: Diagnosis not present

## 2016-05-28 DIAGNOSIS — Z79899 Other long term (current) drug therapy: Secondary | ICD-10-CM | POA: Diagnosis not present

## 2016-05-28 LAB — CBC WITH DIFFERENTIAL/PLATELET
BASOS ABS: 0 10*3/uL (ref 0.0–0.1)
BASOS PCT: 1 %
Eosinophils Absolute: 0.2 10*3/uL (ref 0.0–0.7)
Eosinophils Relative: 2 %
HEMATOCRIT: 40.1 % (ref 36.0–46.0)
Hemoglobin: 13.4 g/dL (ref 12.0–15.0)
Lymphocytes Relative: 46 %
Lymphs Abs: 3.5 10*3/uL (ref 0.7–4.0)
MCH: 30.1 pg (ref 26.0–34.0)
MCHC: 33.4 g/dL (ref 30.0–36.0)
MCV: 90.1 fL (ref 78.0–100.0)
MONO ABS: 0.4 10*3/uL (ref 0.1–1.0)
Monocytes Relative: 6 %
NEUTROS ABS: 3.4 10*3/uL (ref 1.7–7.7)
NEUTROS PCT: 45 %
Platelets: 225 10*3/uL (ref 150–400)
RBC: 4.45 MIL/uL (ref 3.87–5.11)
RDW: 12.8 % (ref 11.5–15.5)
WBC: 7.6 10*3/uL (ref 4.0–10.5)

## 2016-05-28 LAB — URINALYSIS, ROUTINE W REFLEX MICROSCOPIC
BILIRUBIN URINE: NEGATIVE
GLUCOSE, UA: NEGATIVE mg/dL
HGB URINE DIPSTICK: NEGATIVE
KETONES UR: NEGATIVE mg/dL
LEUKOCYTES UA: NEGATIVE
NITRITE: NEGATIVE
PH: 7 (ref 5.0–8.0)
Protein, ur: NEGATIVE mg/dL
Specific Gravity, Urine: 1.024 (ref 1.005–1.030)

## 2016-05-28 LAB — BASIC METABOLIC PANEL
ANION GAP: 8 (ref 5–15)
BUN: 14 mg/dL (ref 6–20)
CALCIUM: 9.7 mg/dL (ref 8.9–10.3)
CO2: 26 mmol/L (ref 22–32)
Chloride: 105 mmol/L (ref 101–111)
Creatinine, Ser: 0.59 mg/dL (ref 0.44–1.00)
Glucose, Bld: 96 mg/dL (ref 65–99)
Potassium: 3.7 mmol/L (ref 3.5–5.1)
Sodium: 139 mmol/L (ref 135–145)

## 2016-05-28 MED ORDER — IOPAMIDOL (ISOVUE-300) INJECTION 61%
INTRAVENOUS | Status: AC
  Administered 2016-05-28: 100 mL
  Filled 2016-05-28: qty 100

## 2016-05-28 MED ORDER — IOPAMIDOL (ISOVUE-300) INJECTION 61%
INTRAVENOUS | Status: AC
  Administered 2016-05-28: 30 mL
  Filled 2016-05-28: qty 30

## 2016-05-28 NOTE — ED Provider Notes (Signed)
[facility] DEPT Provider Note   CSN: [id number] Arrival date & time: 05/28/16  0002   By signing my name below, I, Delton Prairie, attest that this documentation has been prepared under the direction and in the presence of Sherwood Gambler, MD  Electronically Signed: Delton Prairie, ED Scribe. 05/28/16. 12:27 AM.   History   Chief Complaint Chief Complaint  Patient presents with  . Abdominal Pain    HPI Comments:  Emily Cooke is a 66 y.o. female who presents to the Emergency Department complaining of intermittent, dull, "9/10" abdominal pain x 1 week which became constant yesterday. She notes her pain is worse upon palpation and better after passing gas. No alleviating factors noted. Pt denies nausea, vomiting, diarrhea, constipation, back pain, fevers, chest pain, dysuria, hematuria or any other associated symptoms. No other complaints noted.    The history is provided by the patient. No language interpreter was used.    Past Medical History:  Diagnosis Date  . Anxiety   . Arthritis   . GERD (gastroesophageal reflux disease)   . Hypertension   . Migraine   . Panic attack   . Panic disorder   . UTI (lower urinary tract infection)   . Vertigo     There are no active problems to display for this patient.   Past Surgical History:  Procedure Laterality Date  . ABDOMINAL HYSTERECTOMY     partial  . BREAST SURGERY      OB History    Gravida Para Term Preterm AB Living   4 3 1 2 1 3    SAB TAB Ectopic Multiple Live Births   1               Home Medications    Prior to Admission medications   Medication Sig Start Date End Date Taking? Authorizing Provider  cyclobenzaprine (FLEXERIL) 10 MG tablet Take 1 tablet (10 mg total) by mouth 2 (two) times daily as needed for muscle spasms. 03/29/15  Yes [name], MD  hydrOXYzine (ATARAX/VISTARIL) 25 MG tablet Take 1 tablet by mouth three times daily as needed anxiety. 01/12/15  Yes Historical Provider, MD  lisinopril  (PRINIVIL,ZESTRIL) 5 MG tablet Take 0.5 tablets (2.5 mg total) by mouth daily. 04/09/14  Yes [name], MD  meclizine (ANTIVERT) 25 MG tablet Take 1 tablet (25 mg total) by mouth 3 (three) times daily as needed for dizziness. 11/21/15  Yes [name], MD  promethazine (PHENERGAN) 12.5 MG tablet Take 12.5 mg by mouth every 6 (six) hours as needed for nausea or vomiting.   Yes Historical Provider, MD  propranolol (INDERAL) 10 MG tablet Take 10 mg by mouth at bedtime.     Yes Historical Provider, MD  RABEprazole (ACIPHEX) 20 MG tablet Take 20 mg by mouth every morning.    Yes Historical Provider, MD  azithromycin (ZITHROMAX) 250 MG tablet Take first 2 tablets together, then 1 every day until finished. 03/19/16   Tammy Triplett, PA-C  magic mouthwash w/lidocaine SOLN Take 5 mLs by mouth 3 (three) times daily as needed for mouth pain. Swish and spit, do not swallow 03/19/16   Tammy Triplett, PA-C  nitrofurantoin, macrocrystal-monohydrate, (MACROBID) 100 MG capsule Take 1 capsule (100 mg total) by mouth 2 (two) times daily. 01/04/16   Recardo Evangelist, PA-C  ondansetron (ZOFRAN) 4 MG tablet Take 1 tablet (4 mg total) by mouth every 6 (six) hours. 04/01/16   [name], MD    Family History Family History  Problem Relation  Age of Onset  . Hypertension Mother   . Diabetes Father     Social History Social History  Substance Use Topics  . Smoking status: Former Smoker    Packs/day: 2.00    Years: 30.00    Types: Cigarettes    Quit date: 04/01/2002  . Smokeless tobacco: Never Used  . Alcohol use No     Allergies   Cephalexin; Ibuprofen; Amoxicillin; Bactrim [sulfamethoxazole-trimethoprim]; Ciprofloxacin; Imitrex [sumatriptan succinate]; Morphine and related; Tetracyclines & related; and Zoloft [sertraline hcl]   Review of Systems Review of Systems  Constitutional: Negative for fever.  Cardiovascular: Negative for chest pain.  Gastrointestinal: Positive for abdominal pain. Negative  for constipation, diarrhea, nausea and vomiting.  Genitourinary: Negative for dysuria and hematuria.  Musculoskeletal: Negative for back pain.  All other systems reviewed and are negative.  Physical Exam Updated Vital Signs BP (!) 150/74   Pulse 78   Temp 97.9 F (36.6 C) (Oral)   Resp 17   Ht 5\' 8"  (1.727 m)   Wt 224 lb (101.6 kg)   SpO2 97%   BMI 34.06 kg/m   Physical Exam  Constitutional: She is oriented to person, place, and time. She appears well-developed and well-nourished.  HENT:  Head: Normocephalic and atraumatic.  Right Ear: External ear normal.  Left Ear: External ear normal.  Nose: Nose normal.  Eyes: Right eye exhibits no discharge. Left eye exhibits no discharge.  Cardiovascular: Normal rate, regular rhythm and normal heart sounds.   Pulmonary/Chest: Effort normal and breath sounds normal.  Abdominal: Soft. There is tenderness (LLQ). There is no CVA tenderness.  Neurological: She is alert and oriented to person, place, and time.  Skin: Skin is warm and dry.  Nursing note and vitals reviewed.    ED Treatments / Results  DIAGNOSTIC STUDIES:  Oxygen Saturation is 95% on RA, normal by my interpretation.    COORDINATION OF CARE:  12:25 AM Discussed treatment plan with pt at bedside and pt agreed to plan.  Labs (all labs ordered are listed, but only abnormal results are displayed) Labs Reviewed  URINALYSIS, ROUTINE W REFLEX MICROSCOPIC - Abnormal; Notable for the following:       Result Value   Color, Urine COLORLESS (*)    All other components within normal limits  BASIC METABOLIC PANEL  CBC WITH DIFFERENTIAL/PLATELET    EKG  EKG Interpretation None       Radiology Ct Abdomen Pelvis W Contrast  Result Date: 05/28/2016 CLINICAL DATA:  66 year old female with left lower quadrant abdominal pain. EXAM: CT ABDOMEN AND PELVIS WITH CONTRAST TECHNIQUE: Multidetector CT imaging of the abdomen and pelvis was performed using the standard protocol  following bolus administration of intravenous contrast. CONTRAST:  162mL ISOVUE-300 IOPAMIDOL (ISOVUE-300) INJECTION 61%, 40mL ISOVUE-300 IOPAMIDOL (ISOVUE-300) INJECTION 61% COMPARISON:  None. FINDINGS: Lower chest: The visualized lung bases are clear. No intra-abdominal free air or free fluid. Hepatobiliary: There is a 2 cm stone within the gallbladder. No pericholecystic fluid. The liver is unremarkable. No intrahepatic biliary ductal dilatation. Pancreas: Unremarkable. No pancreatic ductal dilatation or surrounding inflammatory changes. Spleen: Normal in size without focal abnormality. Adrenals/Urinary Tract: The adrenal glands are unremarkable. The kidneys, visualized ureters, and urinary bladder appear unremarkable as well. There are multiple small bilateral parapelvic renal cysts. There is symmetric uptake and excretion of contrast by the kidneys. Stomach/Bowel: There is sigmoid diverticulosis without active inflammatory changes. There is no evidence of bowel obstruction or active inflammation. A small hiatal hernia is noted. The appendix  is normal. Vascular/Lymphatic: There is moderate aortoiliac atherosclerotic disease. The origins of the celiac axis, SMA, IMA and the renal arteries are patent. The SMV, splenic vein, and main portal vein are patent. No portal venous gas identified. There is mild haziness of the mesentery with multiple top-normal lymph nodes with a "misty mesentery" appearance. This finding is nonspecific but may be related to underlying inflammatory/infectious etiology. There is no adenopathy. Reproductive: Hysterectomy. Other: None Musculoskeletal: Osteopenia with degenerative changes of the spine. No acute fracture or dislocation. IMPRESSION: 1. Nonspecific haziness of the upper abdominal mesentery with multiple small lymph nodes, likely representing mesenteric panniculitis. No fluid collection. 2. Cholelithiasis. 3. Small hiatal hernia. No bowel obstruction or active inflammation. Normal  appendix. 4. Sigmoid diverticulosis. Electronically Signed   By: Anner Crete M.D.   On: 05/28/2016 02:23    Procedures Procedures (including critical care time)  Medications Ordered in ED Medications  iopamidol (ISOVUE-300) 61 % injection (100 mLs  Contrast Given 05/28/16 0143)  iopamidol (ISOVUE-300) 61 % injection (30 mLs  Contrast Given 05/28/16 0143)     Initial Impression / Assessment and Plan / ED Course  I have reviewed the triage vital signs and the nursing notes.  Pertinent labs & imaging results that were available during my care of the patient were reviewed by me and considered in my medical decision making (see chart for details).     Patient's CT does not show any evidence of diverticulitis, abscess, perforation, obstruction. However there is a possible mesenteric panniculitis. She has remained well appearing. Her vital signs are unremarkable. Lab work is unremarkable. She has declined pain meds in the ED. I discussed with gastroenterology, Dr. Gala Romney, who recommends conservative treatment with mild analgesics and close follow-up with PCP. Patient already has an appointment in 4 days. He recommends against steroids or antibiotics at this time. At this point she is well-appearing and appears stable for discharge. I have instructed her to return if any symptoms were to worsen or not improve.  Final Clinical Impressions(s) / ED Diagnoses   Final diagnoses:  Left lower quadrant pain  Mesenteric panniculitis (Organ)    New Prescriptions New Prescriptions   No medications on file   I personally performed the services described in this documentation, which was scribed in my presence. The recorded information has been reviewed and is accurate.     Sherwood Gambler, MD 05/28/16 0300

## 2016-05-28 NOTE — ED Triage Notes (Signed)
Pt c/o left lower abd pain that had been intermittent for a week and now more constant, denies vomiting or diarrhea.

## 2016-05-28 NOTE — ED Notes (Signed)
Pt being transported to CT via [place] [facility].

## 2016-05-31 DIAGNOSIS — K219 Gastro-esophageal reflux disease without esophagitis: Secondary | ICD-10-CM | POA: Diagnosis not present

## 2016-05-31 DIAGNOSIS — I10 Essential (primary) hypertension: Secondary | ICD-10-CM | POA: Diagnosis not present

## 2016-05-31 DIAGNOSIS — Z6834 Body mass index (BMI) 34.0-34.9, adult: Secondary | ICD-10-CM | POA: Diagnosis not present

## 2016-07-10 ENCOUNTER — Emergency Department (HOSPITAL_COMMUNITY)
Admission: EM | Admit: 2016-07-10 | Discharge: 2016-07-10 | Disposition: A | Discharge status: Home Health | Payer: Medicare Other | Attending: Emergency Medicine | Admitting: Emergency Medicine

## 2016-07-10 ENCOUNTER — Encounter (HOSPITAL_COMMUNITY): Payer: Self-pay | Admitting: Emergency Medicine

## 2016-07-10 DIAGNOSIS — Z79899 Other long term (current) drug therapy: Secondary | ICD-10-CM | POA: Diagnosis not present

## 2016-07-10 DIAGNOSIS — Z87891 Personal history of nicotine dependence: Secondary | ICD-10-CM | POA: Insufficient documentation

## 2016-07-10 DIAGNOSIS — I10 Essential (primary) hypertension: Secondary | ICD-10-CM | POA: Diagnosis not present

## 2016-07-10 DIAGNOSIS — H65191 Other acute nonsuppurative otitis media, right ear: Secondary | ICD-10-CM

## 2016-07-10 DIAGNOSIS — R197 Diarrhea, unspecified: Secondary | ICD-10-CM | POA: Diagnosis not present

## 2016-07-10 DIAGNOSIS — H9201 Otalgia, right ear: Secondary | ICD-10-CM | POA: Diagnosis present

## 2016-07-10 DIAGNOSIS — H66001 Acute suppurative otitis media without spontaneous rupture of ear drum, right ear: Secondary | ICD-10-CM | POA: Diagnosis not present

## 2016-07-10 MED ORDER — AZITHROMYCIN 250 MG PO TABS: ORAL_TABLET | ORAL | 0 refills | Status: DC

## 2016-07-10 NOTE — ED Provider Notes (Signed)
[facility] DEPT Provider Note   CSN: [id number] Arrival date & time: 07/10/16  1109   By signing my name below, I, Emily Cooke, attest that this documentation has been prepared under the direction and in the presence of [name], PA-C. Electronically Signed: Eunice Cooke, Scribe. 07/10/16. 12:29 PM.   History   Chief Complaint Chief Complaint  Patient presents with  . Otalgia   The history is provided by the patient and medical records. No language interpreter was used.    Emily Cooke is a 66 y.o. female with h/o vertigo and HTN, who presents to the Emergency Department with concern for progressive, worsening R ear pain onset 1-2 days ago. Associated cough, dizziness, fever yesterday, sneezing and diarrhea yesterday that has subsided all noted. She describes pressure like pain to her ear. Dizziness worse with movement. Pt reportedly has antivert for chronic vertigo and notes mild relief to dizziness from this. No other modifying factors noted. Denies fever, vomiting, neck pain or stiffness and headaches.  Past Medical History:  Diagnosis Date  . Anxiety   . Arthritis   . GERD (gastroesophageal reflux disease)   . Hypertension   . Migraine   . Panic attack   . Panic disorder   . UTI (lower urinary tract infection)   . Vertigo     There are no active problems to display for this patient.   Past Surgical History:  Procedure Laterality Date  . ABDOMINAL HYSTERECTOMY     partial  . BREAST SURGERY      OB History    Gravida Para Term Preterm AB Living   4 3 1 2 1 3    SAB TAB Ectopic Multiple Live Births   1               Home Medications    Prior to Admission medications   Medication Sig Start Date End Date Taking? Authorizing Provider  DEXILANT 60 MG capsule Take 60 mg by mouth daily.  05/31/16  Yes [provider]  hydrOXYzine (ATARAX/VISTARIL) 25 MG tablet Take 1 tablet by mouth three times daily as needed anxiety. 01/12/15  Yes  [provider]  lisinopril (PRINIVIL,ZESTRIL) 5 MG tablet Take 0.5 tablets (2.5 mg total) by mouth daily. 04/09/14  Yes [name], MD  propranolol (INDERAL) 10 MG tablet Take 10 mg by mouth at bedtime.     Yes [provider]  magic mouthwash w/lidocaine SOLN Take 5 mLs by mouth 3 (three) times daily as needed for mouth pain. Swish and spit, do not swallow Patient not taking: Reported on 07/10/2016 03/19/16   [name], PA-C  ondansetron (ZOFRAN) 4 MG tablet Take 1 tablet (4 mg total) by mouth every 6 (six) hours. Patient not taking: Reported on 07/10/2016 04/01/16   [name], MD    Family History Family History  Problem Relation Age of Onset  . Hypertension Mother   . Diabetes Father     Social History Social History  Substance Use Topics  . Smoking status: Former Smoker    Packs/day: 2.00    Years: 30.00    Types: Cigarettes    Quit date: 04/01/2002  . Smokeless tobacco: Never Used  . Alcohol use No     Allergies   Cephalexin; Ibuprofen; Amoxicillin; Bactrim [sulfamethoxazole-trimethoprim]; Ciprofloxacin; Imitrex [sumatriptan succinate]; Morphine and related; Tetracyclines & related; and Zoloft [sertraline hcl]   Review of Systems Review of Systems  Constitutional: Positive for fever. Negative for appetite change.  HENT: Positive for congestion,  ear pain and sneezing. Negative for sore throat and trouble swallowing.   Respiratory: Positive for cough. Negative for shortness of breath.   Cardiovascular: Negative for chest pain.  Gastrointestinal: Positive for diarrhea. Negative for abdominal pain and vomiting.  Musculoskeletal: Negative for myalgias, neck pain and neck stiffness.  Skin: Negative for rash.  Neurological: Positive for dizziness. Negative for syncope, weakness and numbness.  All other systems reviewed and are negative.    Physical Exam Updated Vital Signs BP (!) 141/77   Pulse 80   Temp 98.7 F (37.1 C) (Oral)   Resp 18    Wt 224 lb (101.6 kg)   SpO2 99%   BMI 34.06 kg/m   Physical Exam  Constitutional: She is oriented to person, place, and time. She appears well-developed and well-nourished.  HENT:  Head: Normocephalic.  Right Ear: Tympanic membrane is erythematous and bulging.  Left Ear: Tympanic membrane normal.  Mouth/Throat: Uvula is midline, oropharynx is clear and moist and mucous membranes are normal.  Eyes: EOM are normal.  Neck: Normal range of motion.  Cardiovascular: Normal rate, regular rhythm and intact distal pulses.   Pulmonary/Chest: Effort normal and breath sounds normal. No respiratory distress.  Musculoskeletal: Normal range of motion.  Neurological: She is alert and oriented to person, place, and time. No sensory deficit.  CN II-XII grossly intact  Skin: Skin is warm.  Psychiatric: She has a normal mood and affect.  Nursing note and vitals reviewed.    ED Treatments / Results  DIAGNOSTIC STUDIES: Oxygen Saturation is 99% on RA, NL by my interpretation.    COORDINATION OF CARE: 12:24 PM-Discussed next steps with pt. Pt verbalized understanding and is agreeable with the plan. Will Rx medications. Pt prepared for d/c, advised of symptomatic care at home and return precautions.    Labs (all labs ordered are listed, but only abnormal results are displayed) Labs Reviewed - No data to display  EKG  EKG Interpretation None       Radiology No results found.  Procedures Procedures (including critical care time)  Medications Ordered in ED Medications - No data to display   Initial Impression / Assessment and Plan / ED Course  I have reviewed the triage vital signs and the nursing notes.  Pertinent labs & imaging results that were available during my care of the patient were reviewed by me and considered in my medical decision making (see chart for details).     Right OM.  Pt ambulated with steady gait.  No focal neuro deficits.  Mild vertiginous sx's that is  recurrent.  Pt has antivert and prefers to take as needed.  rx for zithromax.  Appears stable for d/c  Final Clinical Impressions(s) / ED Diagnoses   Final diagnoses:  Other acute nonsuppurative otitis media of right ear, recurrence not specified    New Prescriptions New Prescriptions   No medications on file    I personally performed the services described in this documentation, which was scribed in my presence. The recorded information has been reviewed and is accurate.    Kem Parkinson, PA-C 07/10/16 2211    Noemi Chapel, MD 07/11/16 [phone]

## 2016-07-10 NOTE — Discharge Instructions (Signed)
Take your antivert as directed if needed for dizziness.  Follow-up with your doctor.  You can also try taking Claritin daily

## 2016-07-31 ENCOUNTER — Emergency Department (HOSPITAL_COMMUNITY)
Admission: EM | Admit: 2016-07-31 | Discharge: 2016-07-31 | Disposition: A | Discharge status: Home / Self Care | Payer: Medicare Other | Attending: Emergency Medicine | Admitting: Emergency Medicine

## 2016-07-31 ENCOUNTER — Encounter (HOSPITAL_COMMUNITY): Payer: Self-pay | Admitting: *Deleted

## 2016-07-31 DIAGNOSIS — I10 Essential (primary) hypertension: Secondary | ICD-10-CM | POA: Diagnosis not present

## 2016-07-31 DIAGNOSIS — H6691 Otitis media, unspecified, right ear: Secondary | ICD-10-CM

## 2016-07-31 DIAGNOSIS — Z79899 Other long term (current) drug therapy: Secondary | ICD-10-CM | POA: Insufficient documentation

## 2016-07-31 DIAGNOSIS — H9201 Otalgia, right ear: Secondary | ICD-10-CM | POA: Diagnosis present

## 2016-07-31 DIAGNOSIS — Z87891 Personal history of nicotine dependence: Secondary | ICD-10-CM | POA: Diagnosis not present

## 2016-07-31 MED ORDER — AZITHROMYCIN 250 MG PO TABS: 250.0000 mg | ORAL_TABLET | Freq: Every day | ORAL | 0 refills | Status: DC

## 2016-07-31 NOTE — ED Provider Notes (Signed)
[place] DEPT Provider Note   CSN: [id number] Arrival date & time: 07/31/16  1027  By signing my name below, I, Marcello Moores, attest that this documentation has been prepared under the direction and in the presence of Zurri Rudden, PA-C. Electronically Signed: Marcello Moores, ED Scribe. 07/31/16. 11:30 AM.   History   Chief Complaint Chief Complaint  Patient presents with  . Otalgia   The history is provided by the patient. No language interpreter was used.   HPI Comments: Emily Cooke is a 66 y.o. female who presents to the Emergency Department complaining of moderate, gradually worsening right ear "popping" pain with associated subjective fever that began this morning. She reports that she was treated for the same condition on (07/10/2016). The pt takes Claritin regularly to treat her seasonal allergies. The pt denies right ear drainage and congestion.  Past Medical History:  Diagnosis Date  . Anxiety   . Arthritis   . GERD (gastroesophageal reflux disease)   . Hypertension   . Migraine   . Panic attack   . Panic disorder   . UTI (lower urinary tract infection)   . Vertigo     There are no active problems to display for this patient.   Past Surgical History:  Procedure Laterality Date  . ABDOMINAL HYSTERECTOMY     partial  . BREAST SURGERY      OB History    Gravida Para Term Preterm AB Living   4 3 1 2 1 3    SAB TAB Ectopic Multiple Live Births   1               Home Medications    Prior to Admission medications   Medication Sig Start Date End Date Taking? Authorizing Provider  azithromycin (ZITHROMAX) 250 MG tablet Take first 2 tablets together, then 1 every day until finished. 07/10/16   Triplett, Tammy, PA-C  DEXILANT 60 MG capsule Take 60 mg by mouth daily.  05/31/16   [provider]  hydrOXYzine (ATARAX/VISTARIL) 25 MG tablet Take 1 tablet by mouth three times daily as needed anxiety. 01/12/15   [provider]    lisinopril (PRINIVIL,ZESTRIL) 5 MG tablet Take 0.5 tablets (2.5 mg total) by mouth daily. 04/09/14   [name], MD  magic mouthwash w/lidocaine SOLN Take 5 mLs by mouth 3 (three) times daily as needed for mouth pain. Swish and spit, do not swallow Patient not taking: Reported on 07/10/2016 03/19/16   Triplett, Tammy, PA-C  ondansetron (ZOFRAN) 4 MG tablet Take 1 tablet (4 mg total) by mouth every 6 (six) hours. Patient not taking: Reported on 07/10/2016 04/01/16   [name], MD  propranolol (INDERAL) 10 MG tablet Take 10 mg by mouth at bedtime.      [provider]    Family History Family History  Problem Relation Age of Onset  . Hypertension Mother   . Diabetes Father     Social History Social History  Substance Use Topics  . Smoking status: Former Smoker    Packs/day: 2.00    Years: 30.00    Types: Cigarettes    Quit date: 04/01/2002  . Smokeless tobacco: Never Used  . Alcohol use No     Allergies   Cephalexin; Ibuprofen; Amoxicillin; Bactrim [sulfamethoxazole-trimethoprim]; Ciprofloxacin; Imitrex [sumatriptan succinate]; Morphine and related; Tetracyclines & related; and Zoloft [sertraline hcl]   Review of Systems Review of Systems  Constitutional: Positive for fever (subjective).  HENT: Positive for ear pain. Negative for congestion and ear  discharge.      Physical Exam Updated Vital Signs BP 140/63 (BP Location: Right Arm)   Pulse 87   Temp 98.2 F (36.8 C) (Oral)   Resp 16   Ht 5\' 8"  (1.727 m)   Wt 220 lb (99.8 kg)   SpO2 98%   BMI 33.45 kg/m   Physical Exam  Constitutional: She is oriented to person, place, and time. She appears well-developed and well-nourished.  HENT:  Head: Normocephalic.  Mouth/Throat: Oropharynx is clear and moist.  Right TM erythematous, bulging. Normal external ears and ear canals normal bilaterally. Left tm normal.   Eyes: EOM are normal.  Neck: Normal range of motion.  Cardiovascular: Normal rate, regular  rhythm and normal heart sounds.   No murmur heard. Pulmonary/Chest: Effort normal and breath sounds normal. No respiratory distress. She has no wheezes. She has no rales.  Abdominal: She exhibits no distension.  Musculoskeletal: Normal range of motion.  Neurological: She is alert and oriented to person, place, and time.  Psychiatric: She has a normal mood and affect.  Nursing note and vitals reviewed.    ED Treatments / Results   DIAGNOSTIC STUDIES: Oxygen Saturation is 98% on RA, normal by my interpretation.   COORDINATION OF CARE: 11:28 AM-Discussed next steps with pt. Pt verbalized understanding and is agreeable with the plan.   Labs (all labs ordered are listed, but only abnormal results are displayed) Labs Reviewed - No data to display  EKG  EKG Interpretation None       Radiology No results found.  Procedures Procedures (including critical care time)  Medications Ordered in ED Medications - No data to display   Initial Impression / Assessment and Plan / ED Course  I have reviewed the triage vital signs and the nursing notes.  Pertinent labs & imaging results that were available during my care of the patient were reviewed by me and considered in my medical decision making (see chart for details).    Patient with exam concerning for otitis media. Will start on Zithromax, patient has multiple antibiotic allergies. Advised to take decongestion. Follow up as needed. Otherwise nontoxic, no other complaints.  Vitals:   07/31/16 1038  BP: 140/63  Pulse: 87  Resp: 16  Temp: 98.2 F (36.8 C)  TempSrc: Oral  SpO2: 98%  Weight: 99.8 kg (220 lb)  Height: 5\' 8"  (1.727 m)     Final Clinical Impressions(s) / ED Diagnoses   Final diagnoses:  Right otitis media, unspecified otitis media type    New Prescriptions Discharge Medication List as of 07/31/2016 11:33 AM     I personally performed the services described in this documentation, which was scribed in  my presence. The recorded information has been reviewed and is accurate.      Jeannett Senior, PA-C 07/31/16 1432    Pattricia Boss, MD 08/01/16 [phone]

## 2016-07-31 NOTE — Discharge Instructions (Signed)
Make sure to remember to take claritin daily. Tylenol for pain. Zithromax as prescribed until all gone. Follow up with your doctor as needed.

## 2016-07-31 NOTE — ED Triage Notes (Signed)
Pt has pain in her right ear that started today. She states she was recently here for the same and took her antibiotics to completion.

## 2016-08-02 ENCOUNTER — Encounter (HOSPITAL_COMMUNITY): Payer: Self-pay | Admitting: *Deleted

## 2016-08-02 ENCOUNTER — Emergency Department (HOSPITAL_COMMUNITY): Payer: Medicare Other

## 2016-08-02 ENCOUNTER — Emergency Department (HOSPITAL_COMMUNITY)
Admission: EM | Admit: 2016-08-02 | Discharge: 2016-08-02 | Disposition: A | Discharge status: Home / Self Care | Payer: Medicare Other | Attending: Emergency Medicine | Admitting: Emergency Medicine

## 2016-08-02 DIAGNOSIS — M19041 Primary osteoarthritis, right hand: Secondary | ICD-10-CM | POA: Diagnosis not present

## 2016-08-02 DIAGNOSIS — M79602 Pain in left arm: Secondary | ICD-10-CM | POA: Insufficient documentation

## 2016-08-02 DIAGNOSIS — Z79899 Other long term (current) drug therapy: Secondary | ICD-10-CM | POA: Insufficient documentation

## 2016-08-02 DIAGNOSIS — Y999 Unspecified external cause status: Secondary | ICD-10-CM | POA: Diagnosis not present

## 2016-08-02 DIAGNOSIS — Z87891 Personal history of nicotine dependence: Secondary | ICD-10-CM | POA: Insufficient documentation

## 2016-08-02 DIAGNOSIS — M25522 Pain in left elbow: Secondary | ICD-10-CM | POA: Diagnosis not present

## 2016-08-02 DIAGNOSIS — S4992XA Unspecified injury of left shoulder and upper arm, initial encounter: Secondary | ICD-10-CM | POA: Diagnosis present

## 2016-08-02 DIAGNOSIS — X500XXA Overexertion from strenuous movement or load, initial encounter: Secondary | ICD-10-CM | POA: Diagnosis not present

## 2016-08-02 DIAGNOSIS — Y939 Activity, unspecified: Secondary | ICD-10-CM | POA: Insufficient documentation

## 2016-08-02 DIAGNOSIS — I10 Essential (primary) hypertension: Secondary | ICD-10-CM | POA: Diagnosis not present

## 2016-08-02 DIAGNOSIS — Y929 Unspecified place or not applicable: Secondary | ICD-10-CM | POA: Diagnosis not present

## 2016-08-02 LAB — TROPONIN I

## 2016-08-02 MED ORDER — ACETAMINOPHEN 325 MG PO TABS
650.0000 mg | ORAL_TABLET | Freq: Once | ORAL | Status: AC
  Administered 2016-08-02: 650 mg via ORAL
  Filled 2016-08-02: qty 2

## 2016-08-02 NOTE — ED Notes (Signed)
Patient transported to X-ray 

## 2016-08-02 NOTE — ED Notes (Signed)
Pt ambulated to BR at this time, steady gait, pain 0/10 reported

## 2016-08-02 NOTE — Discharge Instructions (Addendum)
Take tylenol and antiinflammatories as prescribed. Follow up with Dr. Legrand Rams. Return to the ED if you develop new or worsening symptoms.

## 2016-08-02 NOTE — ED Provider Notes (Signed)
[facility] DEPT Provider Note   CSN: [id number] Arrival date & time: 08/02/16  0123     History   Chief Complaint Chief Complaint  Patient presents with  . Arm Pain    HPI Emily Cooke is a 66 y.o. female.  Patient presents with pain in her left arm that she woke up with yesterday morning. She reports the pain is constant, worse with movement. Denies any falls or trauma. She has been doing some lifting. Recently diagnosed with ear infection 2 days ago. Denies any neck pain, shoulder pain, chest pain or back pain. Denies any weakness, numbness or tingling. The arm pain is worse with movement of her elbow and wrist. She thought it might be arthritis. She denies any tingling or numbness. She denies any rash. She denies any abdominal pain or chest pain.   The history is provided by the patient.  Arm Pain  Pertinent negatives include no abdominal pain, no headaches and no shortness of breath.    Past Medical History:  Diagnosis Date  . Anxiety   . Arthritis   . GERD (gastroesophageal reflux disease)   . Hypertension   . Migraine   . Panic attack   . Panic disorder   . UTI (lower urinary tract infection)   . Vertigo     There are no active problems to display for this patient.   Past Surgical History:  Procedure Laterality Date  . ABDOMINAL HYSTERECTOMY     partial  . BREAST SURGERY      OB History    Gravida Para Term Preterm AB Living   4 3 1 2 1 3    SAB TAB Ectopic Multiple Live Births   1               Home Medications    Prior to Admission medications   Medication Sig Start Date End Date Taking? Authorizing Provider  azithromycin (ZITHROMAX) 250 MG tablet Take 1 tablet (250 mg total) by mouth daily. Take first 2 tablets together, then 1 every day until finished. 07/31/16   Kirichenko, Tatyana, PA-C  DEXILANT 60 MG capsule Take 60 mg by mouth daily.  05/31/16   [provider]  hydrOXYzine (ATARAX/VISTARIL) 25 MG tablet Take 1 tablet by  mouth three times daily as needed anxiety. 01/12/15   [provider]  lisinopril (PRINIVIL,ZESTRIL) 5 MG tablet Take 0.5 tablets (2.5 mg total) by mouth daily. 04/09/14   [name], MD  magic mouthwash w/lidocaine SOLN Take 5 mLs by mouth 3 (three) times daily as needed for mouth pain. Swish and spit, do not swallow Patient not taking: Reported on 07/10/2016 03/19/16   Triplett, Tammy, PA-C  ondansetron (ZOFRAN) 4 MG tablet Take 1 tablet (4 mg total) by mouth every 6 (six) hours. Patient not taking: Reported on 07/10/2016 04/01/16   [name], MD  propranolol (INDERAL) 10 MG tablet Take 10 mg by mouth at bedtime.      [provider]    Family History Family History  Problem Relation Age of Onset  . Hypertension Mother   . Diabetes Father     Social History Social History  Substance Use Topics  . Smoking status: Former Smoker    Packs/day: 2.00    Years: 30.00    Types: Cigarettes    Quit date: 04/01/2002  . Smokeless tobacco: Never Used  . Alcohol use No     Allergies   Cephalexin; Ibuprofen; Amoxicillin; Bactrim [sulfamethoxazole-trimethoprim]; Ciprofloxacin; Imitrex [sumatriptan succinate]; Morphine and  related; Tetracyclines & related; and Zoloft [sertraline hcl]   Review of Systems Review of Systems  Constitutional: Negative for activity change, appetite change and fever.  HENT: Negative for congestion and nosebleeds.   Respiratory: Negative for cough, chest tightness and shortness of breath.   Gastrointestinal: Negative for abdominal pain, nausea and vomiting.  Genitourinary: Negative for dysuria, hematuria, vaginal bleeding and vaginal discharge.  Musculoskeletal: Positive for arthralgias and myalgias.  Neurological: Negative for dizziness, weakness and headaches.    all other systems are negative except as noted in the HPI and PMH.    Physical Exam Updated Vital Signs BP (!) 167/90 (BP Location: Right Arm)   Pulse 86   Temp 97.9 F (36.6  C) (Oral)   Resp 16   Ht 5\' 8"  (1.727 m)   Wt 99.8 kg (220 lb)   SpO2 98%   BMI 33.45 kg/m   Physical Exam  Constitutional: She is oriented to person, place, and time. She appears well-developed and well-nourished. No distress.  HENT:  Head: Normocephalic and atraumatic.  Mouth/Throat: Oropharynx is clear and moist. No oropharyngeal exudate.  Eyes: Conjunctivae and EOM are normal. Pupils are equal, round, and reactive to light.  Neck: Normal range of motion. Neck supple.  No meningismus.  Cardiovascular: Normal rate, regular rhythm, normal heart sounds and intact distal pulses.   No murmur heard. Pulmonary/Chest: Effort normal and breath sounds normal. No respiratory distress.  Abdominal: Soft. There is no tenderness. There is no rebound and no guarding.  Musculoskeletal: Normal range of motion. She exhibits no edema or tenderness.  FROM L shoulder, elbow, wrist and fingers. Mild edema to dorsal left hand. Intact radial pulse. Intact grip strength. No pain with range of motion of shoulder. Pain with range of motion of left elbow, left wrist and fingers. There is no erythema. There is no deformity.  Neurological: She is alert and oriented to person, place, and time. No cranial nerve deficit. She exhibits normal muscle tone. Coordination normal.  No ataxia on finger to nose bilaterally. No pronator drift. 5/5 strength throughout. CN 2-12 intact.Equal grip strength. Sensation intact.   Skin: Skin is warm.  Psychiatric: She has a normal mood and affect. Her behavior is normal.  Nursing note and vitals reviewed.    ED Treatments / Results  Labs (all labs ordered are listed, but only abnormal results are displayed) Labs Reviewed - No data to display  EKG  EKG Interpretation  Date/Time:  Tuesday Aug 02 2016 01:35:58 EDT Ventricular Rate:  99 PR Interval:    QRS Duration: 83 QT Interval:  361 QTC Calculation: 464 R Axis:   28 Text Interpretation:  Sinus rhythm Anteroseptal  infarct, age indeterminate Rate faster Confirmed by Ezequiel Essex [phone]) on 08/02/2016 1:43:06 AM       Radiology Dg Elbow Complete Left  Result Date: 08/02/2016 CLINICAL DATA:  Arm pain, no injury.  History of arthritis. EXAM: LEFT ELBOW - COMPLETE 3+ VIEW COMPARISON:  None. FINDINGS: There is no evidence of fracture, dislocation, or joint effusion. There is no evidence of arthropathy or other focal bone abnormality. Soft tissues are unremarkable. IMPRESSION: Negative. Electronically Signed   By: Elon Alas M.D.   On: 08/02/2016 02:30   Dg Hand Complete Left  Result Date: 08/02/2016 CLINICAL DATA:  Hand pain beginning this morning, no injury. History of arthritis. EXAM: LEFT HAND - COMPLETE 3+ VIEW COMPARISON:  None. FINDINGS: No fracture deformity or dislocation. First metacarpophalangeal joint space narrowing and periarticular sclerosis consistent with  osteoarthrosis. Osteopenia without destructive bony lesions. Soft tissue planes are nonsuspicious. IMPRESSION: No acute fracture deformity or dislocation. Mild first MTP osteoarthrosis. Osteopenia. Electronically Signed   By: Elon Alas M.D.   On: 08/02/2016 02:33    Procedures Procedures (including critical care time)  Medications Ordered in ED Medications  acetaminophen (TYLENOL) tablet 650 mg (not administered)     Initial Impression / Assessment and Plan / ED Course  I have reviewed the triage vital signs and the nursing notes.  Pertinent labs & imaging results that were available during my care of the patient were reviewed by me and considered in my medical decision making (see chart for details).     Left arm pain without trauma. No chest pain or shortness of breath. EKG is normal sinus rhythm. Low suspicion for cardiac etiology of symptoms.   X-ray is negative other than minor arthritis in left hand. Troponin is negative after greater than one day of ongoing pain.  Suspect musculoskeletal pain. Low  suspicion for ACS. Exam not consistent with cervical radiculopathy. Treat supportively with pain medication and anti-inflammatories and PCP follow-up. Return precautions discussed. Final Clinical Impressions(s) / ED Diagnoses   Final diagnoses:  Left arm pain    New Prescriptions New Prescriptions   No medications on file     Ezequiel Essex, MD 08/02/16 [phone]

## 2016-08-02 NOTE — ED Notes (Signed)
ED Provider at bedside. 

## 2016-08-02 NOTE — ED Triage Notes (Signed)
Pt c/o left arm pain that started yesterday; pt denies any radiating pain

## 2016-08-16 DIAGNOSIS — F419 Anxiety disorder, unspecified: Secondary | ICD-10-CM | POA: Diagnosis not present

## 2016-08-29 DIAGNOSIS — K219 Gastro-esophageal reflux disease without esophagitis: Secondary | ICD-10-CM | POA: Diagnosis not present

## 2016-08-29 DIAGNOSIS — H6090 Unspecified otitis externa, unspecified ear: Secondary | ICD-10-CM | POA: Diagnosis not present

## 2016-08-29 DIAGNOSIS — I10 Essential (primary) hypertension: Secondary | ICD-10-CM | POA: Diagnosis not present

## 2016-09-19 ENCOUNTER — Other Ambulatory Visit (HOSPITAL_COMMUNITY): Payer: Self-pay | Admitting: Internal Medicine

## 2016-09-19 DIAGNOSIS — Z1231 Encounter for screening mammogram for malignant neoplasm of breast: Secondary | ICD-10-CM

## 2016-09-22 ENCOUNTER — Encounter (HOSPITAL_COMMUNITY): Payer: Self-pay

## 2016-09-22 ENCOUNTER — Ambulatory Visit (HOSPITAL_COMMUNITY)
Admission: RE | Admit: 2016-09-22 | Discharge: 2016-09-22 | Disposition: A | Discharge status: Home / Self Care | Payer: Medicare Other | Source: Ambulatory Visit | Attending: Internal Medicine | Admitting: Internal Medicine

## 2016-09-22 DIAGNOSIS — Z1231 Encounter for screening mammogram for malignant neoplasm of breast: Secondary | ICD-10-CM | POA: Diagnosis not present

## 2016-09-23 ENCOUNTER — Other Ambulatory Visit (HOSPITAL_COMMUNITY): Payer: Self-pay | Admitting: Internal Medicine

## 2016-09-23 DIAGNOSIS — N6320 Unspecified lump in the left breast, unspecified quadrant: Secondary | ICD-10-CM

## 2016-09-27 ENCOUNTER — Other Ambulatory Visit (HOSPITAL_COMMUNITY): Payer: Self-pay | Admitting: Internal Medicine

## 2016-09-27 DIAGNOSIS — R928 Other abnormal and inconclusive findings on diagnostic imaging of breast: Secondary | ICD-10-CM

## 2016-10-11 ENCOUNTER — Ambulatory Visit (HOSPITAL_COMMUNITY)
Admission: RE | Admit: 2016-10-11 | Discharge: 2016-10-11 | Disposition: A | Discharge status: Home / Self Care | Payer: Medicare Other | Source: Ambulatory Visit | Attending: Internal Medicine | Admitting: Internal Medicine

## 2016-10-11 DIAGNOSIS — N6002 Solitary cyst of left breast: Secondary | ICD-10-CM | POA: Diagnosis not present

## 2016-10-11 DIAGNOSIS — R928 Other abnormal and inconclusive findings on diagnostic imaging of breast: Secondary | ICD-10-CM

## 2016-10-11 DIAGNOSIS — N6323 Unspecified lump in the left breast, lower outer quadrant: Secondary | ICD-10-CM | POA: Diagnosis not present

## 2016-11-28 DIAGNOSIS — G43909 Migraine, unspecified, not intractable, without status migrainosus: Secondary | ICD-10-CM | POA: Diagnosis not present

## 2016-11-28 DIAGNOSIS — E785 Hyperlipidemia, unspecified: Secondary | ICD-10-CM | POA: Diagnosis not present

## 2016-11-28 DIAGNOSIS — K219 Gastro-esophageal reflux disease without esophagitis: Secondary | ICD-10-CM | POA: Diagnosis not present

## 2016-11-28 DIAGNOSIS — Z79899 Other long term (current) drug therapy: Secondary | ICD-10-CM | POA: Diagnosis not present

## 2016-11-28 DIAGNOSIS — Z0000 Encounter for general adult medical examination without abnormal findings: Secondary | ICD-10-CM | POA: Diagnosis not present

## 2016-11-28 DIAGNOSIS — R739 Hyperglycemia, unspecified: Secondary | ICD-10-CM | POA: Diagnosis not present

## 2016-11-28 DIAGNOSIS — F419 Anxiety disorder, unspecified: Secondary | ICD-10-CM | POA: Diagnosis not present

## 2016-11-28 DIAGNOSIS — I10 Essential (primary) hypertension: Secondary | ICD-10-CM | POA: Diagnosis not present

## 2016-11-28 DIAGNOSIS — F339 Major depressive disorder, recurrent, unspecified: Secondary | ICD-10-CM | POA: Diagnosis not present

## 2016-11-28 DIAGNOSIS — R42 Dizziness and giddiness: Secondary | ICD-10-CM | POA: Diagnosis not present

## 2016-11-28 DIAGNOSIS — Z1389 Encounter for screening for other disorder: Secondary | ICD-10-CM | POA: Diagnosis not present

## 2016-12-26 ENCOUNTER — Emergency Department (HOSPITAL_COMMUNITY)
Admission: EM | Admit: 2016-12-26 | Discharge: 2016-12-26 | Disposition: A | Discharge status: Home / Self Care | Payer: Medicare Other | Attending: Emergency Medicine | Admitting: Emergency Medicine

## 2016-12-26 ENCOUNTER — Encounter (HOSPITAL_COMMUNITY): Payer: Self-pay | Admitting: Emergency Medicine

## 2016-12-26 DIAGNOSIS — Z87891 Personal history of nicotine dependence: Secondary | ICD-10-CM | POA: Insufficient documentation

## 2016-12-26 DIAGNOSIS — H9203 Otalgia, bilateral: Secondary | ICD-10-CM

## 2016-12-26 DIAGNOSIS — Z79899 Other long term (current) drug therapy: Secondary | ICD-10-CM | POA: Insufficient documentation

## 2016-12-26 DIAGNOSIS — I10 Essential (primary) hypertension: Secondary | ICD-10-CM | POA: Insufficient documentation

## 2016-12-26 MED ORDER — AZITHROMYCIN 250 MG PO TABS
250.0000 mg | ORAL_TABLET | Freq: Every day | ORAL | 0 refills | Status: DC
Start: 2016-12-26 — End: 2017-10-01

## 2016-12-26 MED ORDER — HYDROCODONE-ACETAMINOPHEN 5-325 MG PO TABS
1.0000 | ORAL_TABLET | Freq: Four times a day (QID) | ORAL | 0 refills | Status: DC | PRN
Start: 2016-12-26 — End: 2018-03-25

## 2016-12-26 NOTE — ED Triage Notes (Signed)
Bilateral ear pain x 2 weeks, given drops by pcp that have not helped.  Pt states rt ear hurts worse than the left.

## 2016-12-26 NOTE — ED Provider Notes (Signed)
[facility] EMERGENCY DEPARTMENT Provider Note   CSN: [id number] Arrival date & time: 12/26/16  1757     History   Chief Complaint Chief Complaint  Patient presents with  . Otalgia    HPI Emily Cooke is a 66 y.o. female.  Patient with 2-week history of bilateral ear pain right greater than left.  Currently being treated with otitis externa drops.  Combination of antibiotic and steroid.  No improvement.  Patient states pain is worse.  No past history of something this severe.  Says there is some hearing changes but it is more like hearing is muffled.  Patient has not followed up with ear nose and throat in the past.  No fevers.  No significant headache.      Past Medical History:  Diagnosis Date  . Anxiety   . Arthritis   . GERD (gastroesophageal reflux disease)   . Hypertension   . Migraine   . Panic attack   . Panic disorder   . UTI (lower urinary tract infection)   . Vertigo     There are no active problems to display for this patient.   Past Surgical History:  Procedure Laterality Date  . ABDOMINAL HYSTERECTOMY     partial  . BREAST BIOPSY     benighn  . BREAST SURGERY      OB History    Gravida Para Term Preterm AB Living   4 3 1 2 1 3    SAB TAB Ectopic Multiple Live Births   1               Home Medications    Prior to Admission medications   Medication Sig Start Date End Date Taking? Authorizing Provider  azithromycin (ZITHROMAX) 250 MG tablet Take 1 tablet (250 mg total) by mouth daily. Take first 2 tablets together, then 1 every day until finished. 07/31/16   Kirichenko, Tatyana, PA-C  azithromycin (ZITHROMAX) 250 MG tablet Take 1 tablet (250 mg total) by mouth daily. Take first 2 tablets together, then 1 every day until finished. 12/26/16   Fredia Sorrow, MD  DEXILANT 60 MG capsule Take 60 mg by mouth daily.  05/31/16   [provider]  HYDROcodone-acetaminophen (NORCO/VICODIN) 5-325 MG tablet Take 1-2 tablets by mouth every  6 (six) hours as needed. 12/26/16   Fredia Sorrow, MD  hydrOXYzine (ATARAX/VISTARIL) 25 MG tablet Take 1 tablet by mouth three times daily as needed anxiety. 01/12/15   [provider]  lisinopril (PRINIVIL,ZESTRIL) 5 MG tablet Take 0.5 tablets (2.5 mg total) by mouth daily. 04/09/14   [name], MD  magic mouthwash w/lidocaine SOLN Take 5 mLs by mouth 3 (three) times daily as needed for mouth pain. Swish and spit, do not swallow Patient not taking: Reported on 07/10/2016 03/19/16   Triplett, Tammy, PA-C  ondansetron (ZOFRAN) 4 MG tablet Take 1 tablet (4 mg total) by mouth every 6 (six) hours. Patient not taking: Reported on 07/10/2016 04/01/16   [name], MD  propranolol (INDERAL) 10 MG tablet Take 10 mg by mouth at bedtime.      [provider]    Family History Family History  Problem Relation Age of Onset  . Hypertension Mother   . Diabetes Father     Social History Social History  Substance Use Topics  . Smoking status: Former Smoker    Packs/day: 2.00    Years: 30.00    Types: Cigarettes    Quit date: 04/01/2002  . Smokeless tobacco: Never  Used  . Alcohol use No     Allergies   Cephalexin; Ibuprofen; Amoxicillin; Bactrim [sulfamethoxazole-trimethoprim]; Ciprofloxacin; Imitrex [sumatriptan succinate]; Morphine and related; Tetracyclines & related; and Zoloft [sertraline hcl]   Review of Systems Review of Systems  Constitutional: Negative for fever.  HENT: Positive for ear pain and hearing loss. Negative for congestion.   Eyes: Negative for redness and visual disturbance.  Respiratory: Negative for shortness of breath.   Cardiovascular: Negative for chest pain.  Gastrointestinal: Negative for abdominal pain and nausea.  Genitourinary: Negative for dysuria.  Musculoskeletal: Negative for neck pain.  Neurological: Negative for headaches.  Hematological: Does not bruise/bleed easily.  Psychiatric/Behavioral: Negative for confusion.      Physical Exam Updated Vital Signs BP (!) 163/83 (BP Location: Right Arm)   Pulse 79   Temp 98.4 F (36.9 C) (Oral)   Resp 18   Ht 1.727 m (5\' 8" )   Wt 101.6 kg (224 lb)   SpO2 98%   BMI 34.06 kg/m   Physical Exam  Constitutional: She is oriented to person, place, and time. She appears well-developed and well-nourished. No distress.  HENT:  Head: Normocephalic and atraumatic.  Right Ear: External ear normal.  Left Ear: External ear normal.  Mouth/Throat: Oropharynx is clear and moist.  No swelling to the ear canal.  Some wax in the ear canals but TMs can be visualized.  No evidence of acute acute infection.  Eyes: Pupils are equal, round, and reactive to light. Conjunctivae and EOM are normal.  Neck: Normal range of motion. Neck supple.  Cardiovascular: Normal rate, regular rhythm and normal heart sounds.   Pulmonary/Chest: Effort normal and breath sounds normal. No respiratory distress.  Abdominal: Soft. Bowel sounds are normal. There is no tenderness.  Musculoskeletal: Normal range of motion. She exhibits no edema.  Neurological: She is alert and oriented to person, place, and time. No cranial nerve deficit or sensory deficit. She exhibits normal muscle tone. Coordination normal.  Skin: Skin is warm.  Nursing note and vitals reviewed.    ED Treatments / Results  Labs (all labs ordered are listed, but only abnormal results are displayed) Labs Reviewed - No data to display  EKG  EKG Interpretation None       Radiology No results found.  Procedures Procedures (including critical care time)  Medications Ordered in ED Medications - No data to display   Initial Impression / Assessment and Plan / ED Course  I have reviewed the triage vital signs and the nursing notes.  Pertinent labs & imaging results that were available during my care of the patient were reviewed by me and considered in my medical decision making (see chart for details).    Patient with  bilateral ear pain.  Exact cause not clear.  Will refer to ear nose and throat.  We will give a trial of Zithromax oral antibiotic and continue the eardrops provided by her primary care provider.  Also will treat the pain with short course of hydrocodone.  Ear nose and throat referral information provided.  Patient nontoxic no acute distress.   Final Clinical Impressions(s) / ED Diagnoses   Final diagnoses:  Otalgia of both ears    New Prescriptions New Prescriptions   AZITHROMYCIN (ZITHROMAX) 250 MG TABLET    Take 1 tablet (250 mg total) by mouth daily. Take first 2 tablets together, then 1 every day until finished.   HYDROCODONE-ACETAMINOPHEN (NORCO/VICODIN) 5-325 MG TABLET    Take 1-2 tablets by mouth every 6 (six)  hours as needed.     Fredia Sorrow, MD 12/26/16 [phone]

## 2016-12-26 NOTE — Discharge Instructions (Signed)
Make an appointment to follow with ear nose and throat specialist.  Referral information provided above.  Continue to use her eardrops.  Start taking the antibiotic Zithromax.  Take the hydrocodone as needed for pain.  Return for any new or worse symptoms.

## 2017-02-27 DIAGNOSIS — K219 Gastro-esophageal reflux disease without esophagitis: Secondary | ICD-10-CM | POA: Diagnosis not present

## 2017-02-27 DIAGNOSIS — I639 Cerebral infarction, unspecified: Secondary | ICD-10-CM | POA: Diagnosis not present

## 2017-02-27 DIAGNOSIS — I10 Essential (primary) hypertension: Secondary | ICD-10-CM | POA: Diagnosis not present

## 2017-02-27 DIAGNOSIS — F419 Anxiety disorder, unspecified: Secondary | ICD-10-CM | POA: Diagnosis not present

## 2017-03-27 DIAGNOSIS — M7592 Shoulder lesion, unspecified, left shoulder: Secondary | ICD-10-CM | POA: Diagnosis not present

## 2017-03-27 DIAGNOSIS — K219 Gastro-esophageal reflux disease without esophagitis: Secondary | ICD-10-CM | POA: Diagnosis not present

## 2017-03-27 DIAGNOSIS — I10 Essential (primary) hypertension: Secondary | ICD-10-CM | POA: Diagnosis not present

## 2017-03-27 DIAGNOSIS — Z6834 Body mass index (BMI) 34.0-34.9, adult: Secondary | ICD-10-CM | POA: Diagnosis not present

## 2017-05-20 DIAGNOSIS — H60313 Diffuse otitis externa, bilateral: Secondary | ICD-10-CM | POA: Diagnosis not present

## 2017-06-01 DIAGNOSIS — K219 Gastro-esophageal reflux disease without esophagitis: Secondary | ICD-10-CM | POA: Diagnosis not present

## 2017-06-01 DIAGNOSIS — I10 Essential (primary) hypertension: Secondary | ICD-10-CM | POA: Diagnosis not present

## 2017-06-01 DIAGNOSIS — Z6834 Body mass index (BMI) 34.0-34.9, adult: Secondary | ICD-10-CM | POA: Diagnosis not present

## 2017-06-01 DIAGNOSIS — F419 Anxiety disorder, unspecified: Secondary | ICD-10-CM | POA: Diagnosis not present

## 2017-08-28 DIAGNOSIS — H6693 Otitis media, unspecified, bilateral: Secondary | ICD-10-CM | POA: Diagnosis not present

## 2017-08-28 DIAGNOSIS — K219 Gastro-esophageal reflux disease without esophagitis: Secondary | ICD-10-CM | POA: Diagnosis not present

## 2017-08-28 DIAGNOSIS — I10 Essential (primary) hypertension: Secondary | ICD-10-CM | POA: Diagnosis not present

## 2017-08-28 DIAGNOSIS — M13849 Other specified arthritis, unspecified hand: Secondary | ICD-10-CM | POA: Diagnosis not present

## 2017-08-29 DIAGNOSIS — Z0100 Encounter for examination of eyes and vision without abnormal findings: Secondary | ICD-10-CM | POA: Diagnosis not present

## 2017-08-29 DIAGNOSIS — H5200 Hypermetropia, unspecified eye: Secondary | ICD-10-CM | POA: Diagnosis not present

## 2017-10-01 ENCOUNTER — Encounter (HOSPITAL_COMMUNITY): Payer: Self-pay | Admitting: *Deleted

## 2017-10-01 ENCOUNTER — Emergency Department (HOSPITAL_COMMUNITY)
Admission: EM | Admit: 2017-10-01 | Discharge: 2017-10-01 | Disposition: A | Discharge status: Home / Self Care | Payer: Medicare HMO | Attending: Emergency Medicine | Admitting: Emergency Medicine

## 2017-10-01 DIAGNOSIS — F419 Anxiety disorder, unspecified: Secondary | ICD-10-CM | POA: Diagnosis not present

## 2017-10-01 DIAGNOSIS — H9201 Otalgia, right ear: Secondary | ICD-10-CM | POA: Insufficient documentation

## 2017-10-01 DIAGNOSIS — Z79899 Other long term (current) drug therapy: Secondary | ICD-10-CM | POA: Insufficient documentation

## 2017-10-01 DIAGNOSIS — Z87891 Personal history of nicotine dependence: Secondary | ICD-10-CM | POA: Diagnosis not present

## 2017-10-01 DIAGNOSIS — I10 Essential (primary) hypertension: Secondary | ICD-10-CM | POA: Diagnosis not present

## 2017-10-01 MED ORDER — AZITHROMYCIN 250 MG PO TABS: ORAL_TABLET | ORAL | 0 refills | Status: DC

## 2017-10-01 MED ORDER — ANTIPYRINE-BENZOCAINE 5.4-1.4 % OT SOLN: 3.0000 [drp] | OTIC | 0 refills | Status: DC | PRN

## 2017-10-01 NOTE — ED Notes (Signed)
CBG resulted on wrong pt

## 2017-10-01 NOTE — ED Triage Notes (Signed)
Pt woke up with right ear pain since this morning.

## 2017-10-01 NOTE — Discharge Instructions (Addendum)
Take the antibiotic as directed.  The eardrop prescription can be filled at [facility] if you choose.  Follow-up with your primary doctor if needed.

## 2017-10-01 NOTE — ED Provider Notes (Signed)
[facility] EMERGENCY DEPARTMENT Provider Note   CSN: [id number] Arrival date & time: 10/01/17  1629     History   Chief Complaint Chief Complaint  Patient presents with  . Otalgia    HPI Emily Cooke is a 67 y.o. female.  HPI  Emily Cooke is a 67 y.o. female who presents to the Emergency Department complaining of right ear pain upon waking this morning.  She describes a constant throbbing type pain to her right ear.  Pain improves when pressure is applied to her outer ear.  Patient reports history of recurrent ear pain and infections.  She denies fever, dizziness, decreased hearing, neck pain, dental pain or drainage from her ear.  No known trauma.   Past Medical History:  Diagnosis Date  . Anxiety   . Arthritis   . GERD (gastroesophageal reflux disease)   . Hypertension   . Migraine   . Panic attack   . Panic disorder   . UTI (lower urinary tract infection)   . Vertigo     There are no active problems to display for this patient.   Past Surgical History:  Procedure Laterality Date  . ABDOMINAL HYSTERECTOMY     partial  . BREAST BIOPSY     benighn  . BREAST SURGERY       OB History    Gravida  4   Para  3   Term  1   Preterm  2   AB  1   Living  3     SAB  1   TAB      Ectopic      Multiple      Live Births               Home Medications    Prior to Admission medications   Medication Sig Start Date End Date Taking? Authorizing Provider  antipyrine-benzocaine Toniann Fail) OTIC solution Place 3-4 drops into the right ear every 2 (two) hours as needed for ear pain. 10/01/17   Caine Barfield, PA-C  azithromycin (ZITHROMAX) 250 MG tablet Take first 2 tablets together, then 1 every day until finished. 10/01/17   Dusten Ellinwood, PA-C  DEXILANT 60 MG capsule Take 60 mg by mouth daily.  05/31/16   [provider]  HYDROcodone-acetaminophen (NORCO/VICODIN) 5-325 MG tablet Take 1-2 tablets by mouth every 6 (six) hours as  needed. 12/26/16   Fredia Sorrow, MD  hydrOXYzine (ATARAX/VISTARIL) 25 MG tablet Take 1 tablet by mouth three times daily as needed anxiety. 01/12/15   [provider]  lisinopril (PRINIVIL,ZESTRIL) 5 MG tablet Take 0.5 tablets (2.5 mg total) by mouth daily. 04/09/14   [name], MD  magic mouthwash w/lidocaine SOLN Take 5 mLs by mouth 3 (three) times daily as needed for mouth pain. Swish and spit, do not swallow Patient not taking: Reported on 07/10/2016 03/19/16   Reginna Sermeno, PA-C  ondansetron (ZOFRAN) 4 MG tablet Take 1 tablet (4 mg total) by mouth every 6 (six) hours. Patient not taking: Reported on 07/10/2016 04/01/16   [name], MD  propranolol (INDERAL) 10 MG tablet Take 10 mg by mouth at bedtime.      [provider]    Family History Family History  Problem Relation Age of Onset  . Hypertension Mother   . Diabetes Father     Social History Social History   Tobacco Use  . Smoking status: Former Smoker    Packs/day: 2.00    Years: 30.00  Pack years: 60.00    Types: Cigarettes    Last attempt to quit: 04/01/2002    Years since quitting: 15.5  . Smokeless tobacco: Never Used  Substance Use Topics  . Alcohol use: No  . Drug use: No     Allergies   Cephalexin; Ibuprofen; Amoxicillin; Bactrim [sulfamethoxazole-trimethoprim]; Ciprofloxacin; Imitrex [sumatriptan succinate]; Morphine and related; Tetracyclines & related; and Zoloft [sertraline hcl]   Review of Systems Review of Systems  Constitutional: Negative for activity change, appetite change, chills and fever.  HENT: Positive for ear pain. Negative for congestion, ear discharge, facial swelling, rhinorrhea, sore throat and trouble swallowing.   Eyes: Negative for visual disturbance.  Respiratory: Negative for cough, shortness of breath, wheezing and stridor.   Cardiovascular: Negative for chest pain.  Gastrointestinal: Negative for nausea and vomiting.  Musculoskeletal: Negative for  neck pain and neck stiffness.  Skin: Negative for rash.  Neurological: Negative for dizziness, weakness, numbness and headaches.  Hematological: Negative for adenopathy.  Psychiatric/Behavioral: Negative for confusion.  All other systems reviewed and are negative.    Physical Exam Updated Vital Signs BP (!) 145/77 (BP Location: Right Arm)   Pulse 83   Temp 98.1 F (36.7 C) (Oral)   Resp 18   Ht 5\' 8"  (1.727 m)   Wt 101.6 kg (224 lb)   SpO2 98%   BMI 34.06 kg/m   Physical Exam  Constitutional: She appears well-nourished. No distress.  HENT:  Head: Atraumatic.  Right Ear: No drainage or swelling. No mastoid tenderness. Tympanic membrane is erythematous. No decreased hearing is noted.  Left Ear: Tympanic membrane and ear canal normal.  Mouth/Throat: Oropharynx is clear and moist.  Mild erythema of the right TM with loss of landmarks.  No bulging.  No edema of the canal.  Neck: Normal range of motion.  Cardiovascular: Normal rate and regular rhythm.  Pulmonary/Chest: Effort normal and breath sounds normal. No respiratory distress.  Musculoskeletal: Normal range of motion.  Lymphadenopathy:    She has no cervical adenopathy.  Neurological: She is alert. No sensory deficit.  Skin: Skin is warm. No rash noted.  Nursing note and vitals reviewed.    ED Treatments / Results  Labs (all labs ordered are listed, but only abnormal results are displayed) Labs Reviewed  CBG MONITORING, ED - Abnormal; Notable for the following components:      Result Value   Glucose-Capillary 68 (*)    All other components within normal limits    EKG None  Radiology No results found.  Procedures Procedures (including critical care time)  Medications Ordered in ED Medications - No data to display   Initial Impression / Assessment and Plan / ED Course  I have reviewed the triage vital signs and the nursing notes.  Pertinent labs & imaging results that were available during my care of  the patient were reviewed by me and considered in my medical decision making (see chart for details).     She well-appearing.  History of recurrent ear pain.  Vitals reviewed.  Afebrile.  Patient agrees to treatment plan and close PCP follow-up.  Precautions discussed.  Final Clinical Impressions(s) / ED Diagnoses   Final diagnoses:  Otalgia of right ear    ED Discharge Orders        Ordered    azithromycin (ZITHROMAX) 250 MG tablet  Status:  Discontinued     10/01/17 1713    antipyrine-benzocaine (AURALGAN) OTIC solution  Every 2 hours PRN     10/01/17 1714  azithromycin (ZITHROMAX) 250 MG tablet     10/01/17 1714       Kem Parkinson, PA-C 10/02/17 0014    Pattricia Boss, MD 10/02/17 1247

## 2017-10-02 LAB — CBG MONITORING, ED: Glucose-Capillary: 68 mg/dL — ABNORMAL LOW (ref 70–99)

## 2017-10-19 ENCOUNTER — Other Ambulatory Visit: Payer: Self-pay

## 2017-10-19 ENCOUNTER — Emergency Department (HOSPITAL_COMMUNITY)
Admission: EM | Admit: 2017-10-19 | Discharge: 2017-10-19 | Disposition: A | Discharge status: Home / Self Care | Payer: Medicare HMO | Attending: Emergency Medicine | Admitting: Emergency Medicine

## 2017-10-19 ENCOUNTER — Encounter (HOSPITAL_COMMUNITY): Payer: Self-pay | Admitting: Emergency Medicine

## 2017-10-19 ENCOUNTER — Emergency Department (HOSPITAL_COMMUNITY): Payer: Medicare HMO

## 2017-10-19 DIAGNOSIS — S299XXA Unspecified injury of thorax, initial encounter: Secondary | ICD-10-CM | POA: Diagnosis present

## 2017-10-19 DIAGNOSIS — S20222A Contusion of left back wall of thorax, initial encounter: Secondary | ICD-10-CM

## 2017-10-19 DIAGNOSIS — Y929 Unspecified place or not applicable: Secondary | ICD-10-CM | POA: Insufficient documentation

## 2017-10-19 DIAGNOSIS — Y939 Activity, unspecified: Secondary | ICD-10-CM | POA: Diagnosis not present

## 2017-10-19 DIAGNOSIS — Y998 Other external cause status: Secondary | ICD-10-CM | POA: Diagnosis not present

## 2017-10-19 DIAGNOSIS — Z87891 Personal history of nicotine dependence: Secondary | ICD-10-CM | POA: Diagnosis not present

## 2017-10-19 DIAGNOSIS — M546 Pain in thoracic spine: Secondary | ICD-10-CM | POA: Diagnosis not present

## 2017-10-19 DIAGNOSIS — S40022A Contusion of left upper arm, initial encounter: Secondary | ICD-10-CM | POA: Diagnosis not present

## 2017-10-19 DIAGNOSIS — W19XXXA Unspecified fall, initial encounter: Secondary | ICD-10-CM

## 2017-10-19 DIAGNOSIS — Z79899 Other long term (current) drug therapy: Secondary | ICD-10-CM | POA: Diagnosis not present

## 2017-10-19 DIAGNOSIS — W01198A Fall on same level from slipping, tripping and stumbling with subsequent striking against other object, initial encounter: Secondary | ICD-10-CM | POA: Diagnosis not present

## 2017-10-19 DIAGNOSIS — I10 Essential (primary) hypertension: Secondary | ICD-10-CM | POA: Diagnosis not present

## 2017-10-19 NOTE — Discharge Instructions (Addendum)
Apply ice packs on and off to your back for the first couple of days then you may alternate with heat if you prefer.  Follow-up with your primary doctor for recheck return here for any worsening symptoms.

## 2017-10-19 NOTE — ED Notes (Signed)
Pt fell yesterday after tripped on a can of paint in the hallway of her home.  Pt denies hitting her head.  Bruising noted to left upper arm.

## 2017-10-19 NOTE — ED Notes (Signed)
Patient transported to X-ray 

## 2017-10-19 NOTE — ED Triage Notes (Signed)
Pt tripped and fell at home yesterday, hitting her left arm and back , bruising to arm noted, coming of upper back pain, pain reproductive by palpation.

## 2017-10-21 NOTE — ED Provider Notes (Signed)
[facility] EMERGENCY DEPARTMENT Provider Note   CSN: [id number] Arrival date & time: 10/19/17  1712     History   Chief Complaint Chief Complaint  Patient presents with  . Fall    HPI Emily Cooke is a 67 y.o. female.  HPI   Emily Cooke is a 67 y.o. female who presents to the Emergency Department complaining of  Left upper back pain secondary to a mechanical fall.  Injury occurred 1 day prior to arrival.  She states that she fell, but caught herself on the wall.  She describes a twisting injury that occurred to her upper back.  She states the pain is worse with twisting and certain movements.  Pain improves while at rest.  She denies swelling, neck or low back pain, head injury or LOC.  No numbness or weakness of the upper extremities.  She does also report a bruise to her left upper arm, but denies pain with movement of her elbow wrist or shoulder.    Past Medical History:  Diagnosis Date  . Anxiety   . Arthritis   . GERD (gastroesophageal reflux disease)   . Hypertension   . Migraine   . Panic attack   . Panic disorder   . UTI (lower urinary tract infection)   . Vertigo     There are no active problems to display for this patient.   Past Surgical History:  Procedure Laterality Date  . ABDOMINAL HYSTERECTOMY     partial  . BREAST BIOPSY     benighn  . BREAST SURGERY       OB History    Gravida  4   Para  3   Term  1   Preterm  2   AB  1   Living  3     SAB  1   TAB      Ectopic      Multiple      Live Births               Home Medications    Prior to Admission medications   Medication Sig Start Date End Date Taking? Authorizing Provider  antipyrine-benzocaine Toniann Fail) OTIC solution Place 3-4 drops into the right ear every 2 (two) hours as needed for ear pain. 10/01/17   Laterrance Nauta, PA-C  azithromycin (ZITHROMAX) 250 MG tablet Take first 2 tablets together, then 1 every day until finished. 10/01/17   Chellsie Gomer,  Naven Giambalvo, PA-C  DEXILANT 60 MG capsule Take 60 mg by mouth daily.  05/31/16   [provider]  HYDROcodone-acetaminophen (NORCO/VICODIN) 5-325 MG tablet Take 1-2 tablets by mouth every 6 (six) hours as needed. 12/26/16   Fredia Sorrow, MD  hydrOXYzine (ATARAX/VISTARIL) 25 MG tablet Take 1 tablet by mouth three times daily as needed anxiety. 01/12/15   [provider]  lisinopril (PRINIVIL,ZESTRIL) 5 MG tablet Take 0.5 tablets (2.5 mg total) by mouth daily. 04/09/14   [name], MD  magic mouthwash w/lidocaine SOLN Take 5 mLs by mouth 3 (three) times daily as needed for mouth pain. Swish and spit, do not swallow Patient not taking: Reported on 07/10/2016 03/19/16   Danyl Deems, PA-C  ondansetron (ZOFRAN) 4 MG tablet Take 1 tablet (4 mg total) by mouth every 6 (six) hours. Patient not taking: Reported on 07/10/2016 04/01/16   [name], MD  propranolol (INDERAL) 10 MG tablet Take 10 mg by mouth at bedtime.      [provider]    [facility]  History Family History  Problem Relation Age of Onset  . Hypertension Mother   . Diabetes Father     Social History Social History   Tobacco Use  . Smoking status: Former Smoker    Packs/day: 2.00    Years: 30.00    Pack years: 60.00    Types: Cigarettes    Last attempt to quit: 04/01/2002    Years since quitting: 15.5  . Smokeless tobacco: Never Used  Substance Use Topics  . Alcohol use: No  . Drug use: No     Allergies   Cephalexin; Ibuprofen; Amoxicillin; Bactrim [sulfamethoxazole-trimethoprim]; Ciprofloxacin; Imitrex [sumatriptan succinate]; Morphine and related; Tetracyclines & related; and Zoloft [sertraline hcl]   Review of Systems Review of Systems  Constitutional: Negative for chills and fever.  Respiratory: Negative for shortness of breath.   Cardiovascular: Negative for chest pain.  Gastrointestinal: Negative for abdominal pain, nausea and vomiting.  Genitourinary: Negative for flank pain.    Musculoskeletal: Positive for back pain. Negative for arthralgias, joint swelling and neck pain.  Skin: Negative for color change.       Bruise to left upper arm  Neurological: Negative for dizziness, syncope, weakness, numbness and headaches.  All other systems reviewed and are negative.    Physical Exam Updated Vital Signs BP (!) 157/75 (BP Location: Right Arm)   Pulse 82   Temp 98 F (36.7 C) (Oral)   Resp 16   Ht 5\' 8"  (1.727 m)   Wt 101.6 kg   SpO2 95%   BMI 34.06 kg/m   Physical Exam  Constitutional: She appears well-nourished. No distress.  HENT:  Head: Atraumatic.  Mouth/Throat: Oropharynx is clear and moist.  Eyes: Pupils are equal, round, and reactive to light. Conjunctivae and EOM are normal.  Neck: Normal range of motion. Neck supple.  Cardiovascular: Normal rate, regular rhythm and intact distal pulses.  Pulmonary/Chest: Effort normal and breath sounds normal. No respiratory distress.  Musculoskeletal: Normal range of motion. She exhibits tenderness. She exhibits no edema.  Focal tenderness to palpation of the left mid to upper thoracic paraspinal muscles.  No edema.  No bony tenderness or deformity.  Patient has full range of motion of bilateral shoulders and left elbow and wrist.  Grip strength is strong and symmetrical.  Neurological: She is alert. No cranial nerve deficit or sensory deficit.  Skin: Skin is warm. Capillary refill takes less than 2 seconds. No rash noted.  Nursing note and vitals reviewed.    ED Treatments / Results  Labs (all labs ordered are listed, but only abnormal results are displayed) Labs Reviewed - No data to display  EKG None  Radiology Dg Thoracic Spine 2 View  Result Date: 10/19/2017 CLINICAL DATA:  Fall left-sided back pain EXAM: THORACIC SPINE 2 VIEWS COMPARISON:  Chest x-ray 04/01/2016 FINDINGS: Alignment within normal limits. Vertebral body heights are within normal limits. Degenerative osteophytes. IMPRESSION: No  acute osseous abnormality. Electronically Signed   By: Donavan Foil M.D.   On: 10/19/2017 18:53     Procedures Procedures (including critical care time)  Medications Ordered in ED Medications - No data to display   Initial Impression / Assessment and Plan / ED Course  I have reviewed the triage vital signs and the nursing notes.  Pertinent labs & imaging results that were available during my care of the patient were reviewed by me and considered in my medical decision making (see chart for details).     X-ray negative for fracture or dislocation.  Patient is neurovascularly intact.  Moves all extremities without difficulty.  Pain is reproduced with palpation of the left thoracic paraspinal muscles.  Pain is felt to be musculoskeletal.  Patient agrees to symptomatic treatment with ice and heat.  States she has pain medication at home.  Orthopedic follow-up in 1 week if not improving.  Final Clinical Impressions(s) / ED Diagnoses   Final diagnoses:  Fall, initial encounter  Contusion of left side of back, initial encounter    ED Discharge Orders    None       Bufford Lope 10/21/17 Judie Grieve, MD 10/21/17 2149

## 2017-11-02 DIAGNOSIS — I10 Essential (primary) hypertension: Secondary | ICD-10-CM | POA: Diagnosis not present

## 2017-11-02 DIAGNOSIS — J029 Acute pharyngitis, unspecified: Secondary | ICD-10-CM | POA: Diagnosis not present

## 2017-11-15 DIAGNOSIS — Z79899 Other long term (current) drug therapy: Secondary | ICD-10-CM | POA: Diagnosis not present

## 2017-11-15 DIAGNOSIS — Z6834 Body mass index (BMI) 34.0-34.9, adult: Secondary | ICD-10-CM | POA: Diagnosis not present

## 2017-11-15 DIAGNOSIS — I10 Essential (primary) hypertension: Secondary | ICD-10-CM | POA: Diagnosis not present

## 2017-11-15 DIAGNOSIS — E785 Hyperlipidemia, unspecified: Secondary | ICD-10-CM | POA: Diagnosis not present

## 2017-11-15 DIAGNOSIS — Z0000 Encounter for general adult medical examination without abnormal findings: Secondary | ICD-10-CM | POA: Diagnosis not present

## 2017-11-15 DIAGNOSIS — K219 Gastro-esophageal reflux disease without esophagitis: Secondary | ICD-10-CM | POA: Diagnosis not present

## 2017-11-15 DIAGNOSIS — Z1331 Encounter for screening for depression: Secondary | ICD-10-CM | POA: Diagnosis not present

## 2017-11-15 DIAGNOSIS — R42 Dizziness and giddiness: Secondary | ICD-10-CM | POA: Diagnosis not present

## 2017-11-15 DIAGNOSIS — Z1389 Encounter for screening for other disorder: Secondary | ICD-10-CM | POA: Diagnosis not present

## 2017-11-15 DIAGNOSIS — F339 Major depressive disorder, recurrent, unspecified: Secondary | ICD-10-CM | POA: Diagnosis not present

## 2017-11-15 DIAGNOSIS — R739 Hyperglycemia, unspecified: Secondary | ICD-10-CM | POA: Diagnosis not present

## 2017-11-21 ENCOUNTER — Other Ambulatory Visit (HOSPITAL_COMMUNITY): Payer: Self-pay | Admitting: Internal Medicine

## 2017-11-21 DIAGNOSIS — R011 Cardiac murmur, unspecified: Secondary | ICD-10-CM

## 2017-11-23 ENCOUNTER — Encounter (HOSPITAL_COMMUNITY): Payer: Medicare HMO

## 2017-11-23 ENCOUNTER — Other Ambulatory Visit (HOSPITAL_COMMUNITY): Payer: Medicare HMO

## 2017-11-30 ENCOUNTER — Ambulatory Visit (HOSPITAL_COMMUNITY)
Admission: RE | Admit: 2017-11-30 | Discharge: 2017-11-30 | Disposition: A | Discharge status: Home / Self Care | Payer: Medicare HMO | Source: Ambulatory Visit | Attending: Internal Medicine | Admitting: Internal Medicine

## 2017-11-30 ENCOUNTER — Other Ambulatory Visit: Payer: Self-pay

## 2017-11-30 DIAGNOSIS — K219 Gastro-esophageal reflux disease without esophagitis: Secondary | ICD-10-CM | POA: Insufficient documentation

## 2017-11-30 DIAGNOSIS — Z87891 Personal history of nicotine dependence: Secondary | ICD-10-CM | POA: Diagnosis not present

## 2017-11-30 DIAGNOSIS — F410 Panic disorder [episodic paroxysmal anxiety] without agoraphobia: Secondary | ICD-10-CM | POA: Insufficient documentation

## 2017-11-30 DIAGNOSIS — I10 Essential (primary) hypertension: Secondary | ICD-10-CM | POA: Insufficient documentation

## 2017-11-30 DIAGNOSIS — R011 Cardiac murmur, unspecified: Secondary | ICD-10-CM | POA: Diagnosis not present

## 2017-11-30 NOTE — Progress Notes (Signed)
*  PRELIMINARY RESULTS* Echocardiogram 2D Echocardiogram has been performed.  Emily Cooke 11/30/2017, 10:12 AM

## 2017-12-04 DIAGNOSIS — Z1212 Encounter for screening for malignant neoplasm of rectum: Secondary | ICD-10-CM | POA: Diagnosis not present

## 2018-02-15 ENCOUNTER — Other Ambulatory Visit: Payer: Self-pay

## 2018-02-15 ENCOUNTER — Emergency Department (HOSPITAL_COMMUNITY)
Admission: EM | Admit: 2018-02-15 | Discharge: 2018-02-15 | Disposition: A | Discharge status: Home / Self Care | Payer: Medicare HMO | Attending: Emergency Medicine | Admitting: Emergency Medicine

## 2018-02-15 ENCOUNTER — Encounter (HOSPITAL_COMMUNITY): Payer: Self-pay | Admitting: *Deleted

## 2018-02-15 DIAGNOSIS — R197 Diarrhea, unspecified: Secondary | ICD-10-CM | POA: Diagnosis not present

## 2018-02-15 DIAGNOSIS — I10 Essential (primary) hypertension: Secondary | ICD-10-CM | POA: Diagnosis not present

## 2018-02-15 DIAGNOSIS — Z87891 Personal history of nicotine dependence: Secondary | ICD-10-CM | POA: Insufficient documentation

## 2018-02-15 DIAGNOSIS — R1084 Generalized abdominal pain: Secondary | ICD-10-CM | POA: Diagnosis not present

## 2018-02-15 DIAGNOSIS — Z79899 Other long term (current) drug therapy: Secondary | ICD-10-CM | POA: Insufficient documentation

## 2018-02-15 LAB — MAGNESIUM: Magnesium: 2 mg/dL (ref 1.7–2.4)

## 2018-02-15 LAB — CBC WITH DIFFERENTIAL/PLATELET
BASOS ABS: 0 10*3/uL (ref 0.0–0.1)
Basophils Relative: 0 %
Eosinophils Absolute: 0.2 10*3/uL (ref 0.0–0.5)
Eosinophils Relative: 2 %
HCT: 45 % (ref 36.0–46.0)
Hemoglobin: 14.3 g/dL (ref 12.0–15.0)
Lymphocytes Relative: 19 %
Lymphs Abs: 1.7 10*3/uL (ref 0.7–4.0)
MCH: 29.5 pg (ref 26.0–34.0)
MCHC: 31.8 g/dL (ref 30.0–36.0)
MCV: 92.8 fL (ref 80.0–100.0)
Monocytes Absolute: 0.5 10*3/uL (ref 0.1–1.0)
Monocytes Relative: 6 %
Neutro Abs: 6.7 10*3/uL (ref 1.7–7.7)
Neutrophils Relative %: 73 %
Platelets: 235 10*3/uL (ref 150–400)
RBC: 4.85 MIL/uL (ref 3.87–5.11)
RDW: 12.4 % (ref 11.5–15.5)
WBC: 9.1 10*3/uL (ref 4.0–10.5)
nRBC: 0 % (ref 0.0–0.2)

## 2018-02-15 LAB — COMPREHENSIVE METABOLIC PANEL
ALT: 20 U/L (ref 0–44)
AST: 27 U/L (ref 15–41)
Albumin: 4.5 g/dL (ref 3.5–5.0)
Alkaline Phosphatase: 77 U/L (ref 38–126)
Anion gap: 9 (ref 5–15)
BUN: 18 mg/dL (ref 8–23)
CO2: 26 mmol/L (ref 22–32)
CREATININE: 0.72 mg/dL (ref 0.44–1.00)
Calcium: 9.3 mg/dL (ref 8.9–10.3)
Chloride: 102 mmol/L (ref 98–111)
GFR calc Af Amer: >60 mL/min (ref >60)
GFR calc non Af Amer: >60 mL/min (ref >60)
Glucose, Bld: 105 mg/dL — ABNORMAL HIGH (ref 70–99)
Potassium: 3.5 mmol/L (ref 3.5–5.1)
Sodium: 137 mmol/L (ref 135–145)
TOTAL PROTEIN: 8.1 g/dL (ref 6.5–8.1)
Total Bilirubin: 0.8 mg/dL (ref 0.3–1.2)

## 2018-02-15 MED ORDER — PROMETHAZINE HCL 12.5 MG PO TABS
12.5000 mg | ORAL_TABLET | Freq: Once | ORAL | Status: AC
  Administered 2018-02-15: 12.5 mg via ORAL

## 2018-02-15 MED ORDER — DICYCLOMINE HCL 20 MG PO TABS: 20.0000 mg | ORAL_TABLET | Freq: Three times a day (TID) | ORAL | 0 refills | Status: AC | PRN

## 2018-02-15 MED ORDER — PROMETHAZINE HCL 25 MG PO TABS: 25.0000 mg | ORAL_TABLET | Freq: Four times a day (QID) | ORAL | 0 refills | Status: DC | PRN

## 2018-02-15 MED ORDER — DICYCLOMINE HCL 10 MG PO CAPS
20.0000 mg | ORAL_CAPSULE | Freq: Once | ORAL | Status: AC
  Administered 2018-02-15: 20 mg via ORAL

## 2018-02-15 MED ORDER — LOPERAMIDE HCL 2 MG PO CAPS: 2.0000 mg | ORAL_CAPSULE | Freq: Four times a day (QID) | ORAL | 0 refills | Status: DC | PRN

## 2018-02-15 MED ORDER — LOPERAMIDE HCL 2 MG PO CAPS
2.0000 mg | ORAL_CAPSULE | Freq: Once | ORAL | Status: AC
  Administered 2018-02-15: 2 mg via ORAL

## 2018-02-15 NOTE — ED Triage Notes (Signed)
Pt c/o n/v/d that started earlier this evening; pt c/o intermittent abdominal cramping

## 2018-02-15 NOTE — ED Provider Notes (Signed)
[facility] EMERGENCY DEPARTMENT Provider Note   CSN: [id number] Arrival date & time: 02/15/18  0130     History   Chief Complaint Chief Complaint  Patient presents with  . Abdominal Pain    HPI Emily Cooke is a 67 y.o. female.  Abdominal pain improves after diarrhea but then slowly worsens.  No blood or mucous in diarrhea.  No known sick contacts, suspicious food.  No fever.    Abdominal Pain   This is a new problem. The current episode started 3 to 5 hours ago. The problem occurs constantly. The problem has not changed since onset.The pain is located in the generalized abdominal region. The quality of the pain is aching, dull and cramping. The pain is mild. Associated symptoms include diarrhea and nausea. Pertinent negatives include anorexia, melena, vomiting, constipation, dysuria and frequency. Nothing aggravates the symptoms. Nothing relieves the symptoms.    Past Medical History:  Diagnosis Date  . Anxiety   . Arthritis   . GERD (gastroesophageal reflux disease)   . Hypertension   . Migraine   . Panic attack   . Panic disorder   . UTI (lower urinary tract infection)   . Vertigo     There are no active problems to display for this patient.   Past Surgical History:  Procedure Laterality Date  . ABDOMINAL HYSTERECTOMY     partial  . BREAST BIOPSY     benighn  . BREAST SURGERY       OB History    Gravida  4   Para  3   Term  1   Preterm  2   AB  1   Living  3     SAB  1   TAB      Ectopic      Multiple      Live Births               Home Medications    Prior to Admission medications   Medication Sig Start Date End Date Taking? Authorizing Provider  antipyrine-benzocaine Toniann Fail) OTIC solution Place 3-4 drops into the right ear every 2 (two) hours as needed for ear pain. 10/01/17   Triplett, Tammy, PA-C  azithromycin (ZITHROMAX) 250 MG tablet Take first 2 tablets together, then 1 every day until finished. 10/01/17    Triplett, Tammy, PA-C  DEXILANT 60 MG capsule Take 60 mg by mouth daily.  05/31/16   [provider]  dicyclomine (BENTYL) 20 MG tablet Take 1 tablet (20 mg total) by mouth 3 (three) times daily as needed (abdominal cramping). 02/15/18   Ranay Ketter, Corene Cornea, MD  HYDROcodone-acetaminophen (NORCO/VICODIN) 5-325 MG tablet Take 1-2 tablets by mouth every 6 (six) hours as needed. 12/26/16   Fredia Sorrow, MD  hydrOXYzine (ATARAX/VISTARIL) 25 MG tablet Take 1 tablet by mouth three times daily as needed anxiety. 01/12/15   [provider]  lisinopril (PRINIVIL,ZESTRIL) 5 MG tablet Take 0.5 tablets (2.5 mg total) by mouth daily. 04/09/14   [name], MD  loperamide (IMODIUM) 2 MG capsule Take 1 capsule (2 mg total) by mouth 4 (four) times daily as needed for diarrhea or loose stools. 02/15/18   Khushi Zupko, Corene Cornea, MD  magic mouthwash w/lidocaine SOLN Take 5 mLs by mouth 3 (three) times daily as needed for mouth pain. Swish and spit, do not swallow Patient not taking: Reported on 07/10/2016 03/19/16   Triplett, Tammy, PA-C  ondansetron (ZOFRAN) 4 MG tablet Take 1 tablet (4 mg total) by mouth every  6 (six) hours. Patient not taking: Reported on 07/10/2016 04/01/16   [name], MD  promethazine (PHENERGAN) 25 MG tablet Take 1 tablet (25 mg total) by mouth every 6 (six) hours as needed for nausea or vomiting. 02/15/18   Tomasita Beevers, Corene Cornea, MD  propranolol (INDERAL) 10 MG tablet Take 10 mg by mouth at bedtime.      [provider]    Family History Family History  Problem Relation Age of Onset  . Hypertension Mother   . Diabetes Father     Social History Social History   Tobacco Use  . Smoking status: Former Smoker    Packs/day: 2.00    Years: 30.00    Pack years: 60.00    Types: Cigarettes    Last attempt to quit: 04/01/2002    Years since quitting: 15.8  . Smokeless tobacco: Never Used  Substance Use Topics  . Alcohol use: No  . Drug use: No     Allergies   Cephalexin;  Ibuprofen; Amoxicillin; Bactrim [sulfamethoxazole-trimethoprim]; Ciprofloxacin; Imitrex [sumatriptan succinate]; Morphine and related; Tetracyclines & related; and Zoloft [sertraline hcl]   Review of Systems Review of Systems  Gastrointestinal: Positive for abdominal pain, diarrhea and nausea. Negative for anorexia, constipation, melena and vomiting.  Genitourinary: Negative for dysuria and frequency.  All other systems reviewed and are negative.    Physical Exam Updated Vital Signs BP (!) 155/69 (BP Location: Right Arm)   Pulse 79   Temp 97.7 F (36.5 C) (Oral)   SpO2 100%   Physical Exam Vitals signs and nursing note reviewed.  Constitutional:      Appearance: She is well-developed.  HENT:     Head: Normocephalic and atraumatic.  Neck:     Musculoskeletal: Normal range of motion.  Cardiovascular:     Rate and Rhythm: Normal rate and regular rhythm.  Pulmonary:     Effort: Pulmonary effort is normal. No respiratory distress.     Breath sounds: No stridor.  Abdominal:     General: Bowel sounds are increased. There is no distension.     Tenderness: There is no abdominal tenderness.  Neurological:     Mental Status: She is alert.      ED Treatments / Results  Labs (all labs ordered are listed, but only abnormal results are displayed) Labs Reviewed  COMPREHENSIVE METABOLIC PANEL - Abnormal; Notable for the following components:      Result Value   Glucose, Bld 105 (*)    All other components within normal limits  MAGNESIUM  CBC WITH DIFFERENTIAL/PLATELET  URINALYSIS, ROUTINE W REFLEX MICROSCOPIC    EKG None  Radiology No results found.  Procedures Procedures (including critical care time)  Medications Ordered in ED Medications  promethazine (PHENERGAN) tablet 12.5 mg (12.5 mg Oral Given 02/15/18 0222)  dicyclomine (BENTYL) capsule 20 mg (20 mg Oral Given 02/15/18 0222)  loperamide (IMODIUM) capsule 2 mg (2 mg Oral Given 02/15/18 0222)     Initial  Impression / Assessment and Plan / ED Course  I have reviewed the triage vital signs and the nursing notes.  Pertinent labs & imaging results that were available during my care of the patient were reviewed by me and considered in my medical decision making (see chart for details).    Suspect viral diarrhea. Will treat symptomatically for now. Low suspicion at this point for colitis, diverticulitis or appendicitis.  Symptoms improved. No ore diarrhea. Stable for dc w/ symptomatic treatmetn.   Final Clinical Impressions(s) / ED Diagnoses  Final diagnoses:  Generalized abdominal pain  Diarrhea, unspecified type    ED Discharge Orders         Ordered    dicyclomine (BENTYL) 20 MG tablet  3 times daily PRN     02/15/18 0332    loperamide (IMODIUM) 2 MG capsule  4 times daily PRN     02/15/18 0332    promethazine (PHENERGAN) 25 MG tablet  Every 6 hours PRN     02/15/18 0332           Alfredo Spong, Corene Cornea, MD 02/15/18 [phone]

## 2018-02-19 DIAGNOSIS — Z6834 Body mass index (BMI) 34.0-34.9, adult: Secondary | ICD-10-CM | POA: Diagnosis not present

## 2018-02-19 DIAGNOSIS — I10 Essential (primary) hypertension: Secondary | ICD-10-CM | POA: Diagnosis not present

## 2018-02-19 DIAGNOSIS — K219 Gastro-esophageal reflux disease without esophagitis: Secondary | ICD-10-CM | POA: Diagnosis not present

## 2018-03-25 ENCOUNTER — Encounter (HOSPITAL_COMMUNITY): Payer: Self-pay | Admitting: *Deleted

## 2018-03-25 ENCOUNTER — Emergency Department (HOSPITAL_COMMUNITY): Payer: Medicare HMO

## 2018-03-25 ENCOUNTER — Emergency Department (HOSPITAL_COMMUNITY)
Admission: EM | Admit: 2018-03-25 | Discharge: 2018-03-25 | Disposition: A | Discharge status: Home / Self Care | Payer: Medicare HMO | Attending: Emergency Medicine | Admitting: Emergency Medicine

## 2018-03-25 ENCOUNTER — Other Ambulatory Visit: Payer: Self-pay

## 2018-03-25 DIAGNOSIS — S59912A Unspecified injury of left forearm, initial encounter: Secondary | ICD-10-CM | POA: Diagnosis present

## 2018-03-25 DIAGNOSIS — S52592A Other fractures of lower end of left radius, initial encounter for closed fracture: Secondary | ICD-10-CM

## 2018-03-25 DIAGNOSIS — W010XXA Fall on same level from slipping, tripping and stumbling without subsequent striking against object, initial encounter: Secondary | ICD-10-CM | POA: Diagnosis not present

## 2018-03-25 DIAGNOSIS — Y999 Unspecified external cause status: Secondary | ICD-10-CM | POA: Diagnosis not present

## 2018-03-25 DIAGNOSIS — I10 Essential (primary) hypertension: Secondary | ICD-10-CM | POA: Diagnosis not present

## 2018-03-25 DIAGNOSIS — Y939 Activity, unspecified: Secondary | ICD-10-CM | POA: Diagnosis not present

## 2018-03-25 DIAGNOSIS — Z79899 Other long term (current) drug therapy: Secondary | ICD-10-CM | POA: Insufficient documentation

## 2018-03-25 DIAGNOSIS — M25522 Pain in left elbow: Secondary | ICD-10-CM | POA: Diagnosis not present

## 2018-03-25 DIAGNOSIS — Z87891 Personal history of nicotine dependence: Secondary | ICD-10-CM | POA: Insufficient documentation

## 2018-03-25 DIAGNOSIS — Y929 Unspecified place or not applicable: Secondary | ICD-10-CM | POA: Insufficient documentation

## 2018-03-25 DIAGNOSIS — S52572A Other intraarticular fracture of lower end of left radius, initial encounter for closed fracture: Secondary | ICD-10-CM | POA: Diagnosis not present

## 2018-03-25 DIAGNOSIS — S59902A Unspecified injury of left elbow, initial encounter: Secondary | ICD-10-CM | POA: Diagnosis not present

## 2018-03-25 MED ORDER — CYCLOBENZAPRINE HCL 10 MG PO TABS: 10.0000 mg | ORAL_TABLET | Freq: Two times a day (BID) | ORAL | 0 refills | Status: AC | PRN

## 2018-03-25 MED ORDER — CYCLOBENZAPRINE HCL 10 MG PO TABS
10.0000 mg | ORAL_TABLET | Freq: Once | ORAL | Status: AC
  Administered 2018-03-25: 10 mg via ORAL
  Filled 2018-03-25: qty 1

## 2018-03-25 NOTE — ED Provider Notes (Signed)
[facility] EMERGENCY DEPARTMENT Provider Note   CSN: [id number] Arrival date & time: 03/25/18  0103     History   Chief Complaint Chief Complaint  Patient presents with  . Fall  Patient gives permission to perform history and physical in front of family/friend HPI Emily Cooke is a 68 y.o. female.  The history is provided by the patient.  Fall  This is a new problem. The current episode started less than 1 hour ago. The problem occurs constantly. The problem has been gradually worsening. Pertinent negatives include no chest pain, no abdominal pain and no headaches. The symptoms are aggravated by bending. The symptoms are relieved by rest.   Patient with history of anxiety, arthritis, hypertension presents after fall.  She reports she slipped in the mud landing on her left arm.  She reports significant pain in left wrist and left elbow.  No head injury or LOC.  No neck pain.  No lower extremity injury. Past Medical History:  Diagnosis Date  . Anxiety   . Arthritis   . GERD (gastroesophageal reflux disease)   . Hypertension   . Migraine   . Panic attack   . Panic disorder   . UTI (lower urinary tract infection)   . Vertigo     There are no active problems to display for this patient.   Past Surgical History:  Procedure Laterality Date  . ABDOMINAL HYSTERECTOMY     partial  . BREAST BIOPSY     benighn  . BREAST SURGERY       OB History    Gravida  4   Para  3   Term  1   Preterm  2   AB  1   Living  3     SAB  1   TAB      Ectopic      Multiple      Live Births               Home Medications    Prior to Admission medications   Medication Sig Start Date End Date Taking? Authorizing Provider  antipyrine-benzocaine Toniann Fail) OTIC solution Place 3-4 drops into the right ear every 2 (two) hours as needed for ear pain. 10/01/17   Triplett, Tammy, PA-C  azithromycin (ZITHROMAX) 250 MG tablet Take first 2 tablets together, then 1 every day  until finished. 10/01/17   Triplett, Tammy, PA-C  DEXILANT 60 MG capsule Take 60 mg by mouth daily.  05/31/16   [provider]  dicyclomine (BENTYL) 20 MG tablet Take 1 tablet (20 mg total) by mouth 3 (three) times daily as needed (abdominal cramping). 02/15/18   Mesner, Corene Cornea, MD  HYDROcodone-acetaminophen (NORCO/VICODIN) 5-325 MG tablet Take 1-2 tablets by mouth every 6 (six) hours as needed. 12/26/16   Fredia Sorrow, MD  hydrOXYzine (ATARAX/VISTARIL) 25 MG tablet Take 1 tablet by mouth three times daily as needed anxiety. 01/12/15   [provider]  lisinopril (PRINIVIL,ZESTRIL) 5 MG tablet Take 0.5 tablets (2.5 mg total) by mouth daily. 04/09/14   [name], MD  loperamide (IMODIUM) 2 MG capsule Take 1 capsule (2 mg total) by mouth 4 (four) times daily as needed for diarrhea or loose stools. 02/15/18   Mesner, Corene Cornea, MD  magic mouthwash w/lidocaine SOLN Take 5 mLs by mouth 3 (three) times daily as needed for mouth pain. Swish and spit, do not swallow Patient not taking: Reported on 07/10/2016 03/19/16   Triplett, Tammy, PA-C  ondansetron [facility]) 4  MG tablet Take 1 tablet (4 mg total) by mouth every 6 (six) hours. Patient not taking: Reported on 07/10/2016 04/01/16   [name], MD  promethazine (PHENERGAN) 25 MG tablet Take 1 tablet (25 mg total) by mouth every 6 (six) hours as needed for nausea or vomiting. 02/15/18   Mesner, Corene Cornea, MD  propranolol (INDERAL) 10 MG tablet Take 10 mg by mouth at bedtime.      [provider]    Family History Family History  Problem Relation Age of Onset  . Hypertension Mother   . Diabetes Father     Social History Social History   Tobacco Use  . Smoking status: Former Smoker    Packs/day: 2.00    Years: 30.00    Pack years: 60.00    Types: Cigarettes    Last attempt to quit: 04/01/2002    Years since quitting: 15.9  . Smokeless tobacco: Never Used  Substance Use Topics  . Alcohol use: No  . Drug use: No      Allergies   Cephalexin; Ibuprofen; Amoxicillin; Bactrim [sulfamethoxazole-trimethoprim]; Ciprofloxacin; Imitrex [sumatriptan succinate]; Morphine and related; Tetracyclines & related; and Zoloft [sertraline hcl]   Review of Systems Review of Systems  Constitutional: Negative for fever.  Cardiovascular: Negative for chest pain.  Gastrointestinal: Negative for abdominal pain.  Musculoskeletal: Positive for arthralgias. Negative for back pain and neck pain.  Neurological: Negative for weakness and headaches.  All other systems reviewed and are negative.    Physical Exam Updated Vital Signs BP (!) 116/58 (BP Location: Right Arm)   Pulse 76   Temp 97.6 F (36.4 C) (Oral)   Resp 18   SpO2 95%   Physical Exam  CONSTITUTIONAL: Well developed/well nourished anxious HEAD: Normocephalic/atraumatic EYES: EOMI/PERRL ENMT: Mucous membranes moist NECK: supple no meningeal signs SPINE/BACK:entire spine nontender CV: S1/S2 noted, no murmurs/rubs/gallops noted LUNGS: Lungs are clear to auscultation bilaterally, no apparent distress ABDOMEN: soft, nontender, no rebound or guarding, bowel sounds noted throughout abdomen GU:no cva tenderness NEURO: Pt is awake/alert/appropriate, moves all extremitiesx4.  No facial droop.   EXTREMITIES: pulses normal/equal, full ROM, significant tenderness and swelling to left wrist. No lacerations   Distal pulses intact.  She is able move her fingers on her left hand without difficulty.  Tenderness to palpation of left elbow.  She is able to range left shoulder without difficulty.  No shoulder tenderness or deformity. No pelvic tenderness.  All other extremities/joints palpated/ranged and nontender SKIN: warm, color normal PSYCH: Anxious ED Treatments / Results  Labs (all labs ordered are listed, but only abnormal results are displayed) Labs Reviewed - No data to display  EKG None  Radiology Dg Elbow Complete Left  Result Date:  03/25/2018 CLINICAL DATA:  Fall with pain. EXAM: LEFT ELBOW - COMPLETE 3+ VIEW COMPARISON:  08/02/2016 FINDINGS: No acute fracture or dislocation.  No joint effusion. IMPRESSION: No acute osseous abnormality. Electronically Signed   By: Abigail Miyamoto M.D.   On: 03/25/2018 02:19   Dg Wrist Complete Left  Result Date: 03/25/2018 CLINICAL DATA:  Fall with left wrist pain. EXAM: LEFT WRIST - COMPLETE 3+ VIEW COMPARISON:  08/02/2016 FINDINGS: Degenerate changes at the base of the thumb and radiocarpal articulation. Minimally impacted distal radius fracture with intra-articular extension about the ulnar side of the radiocarpal articulation. Scaphoid intact. IMPRESSION: Impacted, intra-articular distal radius fracture Electronically Signed   By: Abigail Miyamoto M.D.   On: 03/25/2018 01:49    Procedures Procedures   SPLINT APPLICATION Date/Time: 1:94  AM Authorized by: Sharyon Cable Consent: Verbal consent obtained. Risks and benefits: risks, benefits and alternatives were discussed Consent given by: patient Splint applied by: orthopedic technician Location details: left upper extremity Splint type: sugartong Supplies used: ortho glass Post-procedure: The splinted body part was neurovascularly unchanged following the procedure. Patient tolerance: Patient tolerated the procedure well with no immediate complications.    Medications Ordered in ED Medications  cyclobenzaprine (FLEXERIL) tablet 10 mg (10 mg Oral Given 03/25/18 0158)     Initial Impression / Assessment and Plan / ED Course  I have reviewed the triage vital signs and the nursing notes.  Pertinent   imaging results that were available during my care of the patient were reviewed by me and considered in my medical decision making (see chart for details).     2:12 AM Patient with distal radius fracture, will also need x-ray of her left elbow. No other signs of acute traumatic injury.  She requests Flexeril for pain 2:58 AM Pt  improved No other acute complaints She has isolated left distal radius fx No signs of head or neck injury Referred to ortho-hand She requests flexeril for pain control  Final Clinical Impressions(s) / ED Diagnoses   Final diagnoses:  Other closed fracture of distal end of left radius, initial encounter    ED Discharge Orders         Ordered    cyclobenzaprine (FLEXERIL) 10 MG tablet  2 times daily PRN     03/25/18 0247           [name], MD 03/25/18 [phone]

## 2018-03-25 NOTE — ED Triage Notes (Signed)
Pt states she slipped in the mud causing her to fall; pt c/o left wrist pain

## 2018-03-28 ENCOUNTER — Ambulatory Visit (INDEPENDENT_AMBULATORY_CARE_PROVIDER_SITE_OTHER): Payer: Medicare HMO | Admitting: Orthopaedic Surgery

## 2018-03-28 ENCOUNTER — Encounter: Payer: Self-pay | Admitting: Orthopaedic Surgery

## 2018-03-28 VITALS — BP 158/79 | HR 80 | Ht 68.0 in | Wt 225.0 lb

## 2018-03-28 DIAGNOSIS — S52572A Other intraarticular fracture of lower end of left radius, initial encounter for closed fracture: Secondary | ICD-10-CM | POA: Diagnosis not present

## 2018-03-28 NOTE — Patient Instructions (Signed)
Cast or Splint Care, Adult  Casts and splints are supports that are worn to protect broken bones and other injuries. A cast or splint may hold a bone still and in the correct position while it heals. Casts and splints may also help to ease pain, swelling, and muscle spasms.  How to care for your cast    · Do not stick anything inside the cast to scratch your skin.  · Check the skin around the cast every day. Tell your doctor about any concerns.  · You may put lotion on dry skin around the edges of the cast. Do not put lotion on the skin under the cast.  · Keep the cast clean.  · If the cast is not waterproof:  ? Do not let it get wet.  ? Cover it with a watertight covering when you take a bath or a shower.  How to care for your splint    · Wear it as told by your doctor. Take it off only as told by your doctor.  · Loosen the splint if your fingers or toes tingle, get numb, or turn cold and blue.  · Keep the splint clean.  · If the splint is not waterproof:  ? Do not let it get wet.  ? Cover it with a watertight covering when you take a bath or a shower.  Follow these instructions at home:  Bathing  · Do not take baths or swim until your doctor says it is okay. Ask your doctor if you can take showers. You may only be allowed to take sponge baths for bathing.  · If your cast or splint is not waterproof, cover it with a watertight covering when you take a bath or shower.  Managing pain, stiffness, and swelling  · Move your fingers or toes often to avoid stiffness and to lessen swelling.  · Raise (elevate) the injured area above the level of your heart while sitting or lying down.  Safety  · Do not use the injured limb to support your body weight until your doctor says that it is okay.  · Use crutches or other assistive devices as told by your doctor.  General instructions  · Do not put pressure on any part of the cast or splint until it is fully hardened. This may take many hours.  · Return to your normal activities as  told by your doctor. Ask your doctor what activities are safe for you.  · Keep all follow-up visits as told by your doctor. This is important.  Contact a doctor if:  · Your cast or splint gets damaged.  · The skin around the cast gets red or raw.  · The skin under the cast is very itchy or painful.  · Your cast or splint feels very uncomfortable.  · Your cast or splint is too tight or too loose.  · Your cast becomes wet or it starts to have a soft spot or area.  · You get an object stuck under your cast.  Get help right away if:  · Your pain gets worse.  · The injured area tingles, gets numb, or turns blue and cold.  · The part of your body above or below the cast is swollen and it turns a different color (is discolored).  · You cannot feel or move your fingers or toes.  · There is fluid leaking through the cast.  · You have very bad pain or pressure under the cast.  ·   You have trouble breathing.  · You have shortness of breath.  · You have chest pain.  This information is not intended to replace advice given to you by your health care provider. Make sure you discuss any questions you have with your health care provider.  Document Released: 06/23/2010 Document Revised: 02/12/2016 Document Reviewed: 02/12/2016  Elsevier Interactive Patient Education © 2019 [employer] Inc[employer]

## 2018-03-28 NOTE — Progress Notes (Signed)
Subjective:    Patient ID: Emily Cooke, female    DOB: Jul 10, 1950, 68 y.o.   MRN: [id number]  HPI She fell going up the stairs and hurt her left wrist on 03-25-18.  She was seen in the ER. X-rays showed: IMPRESSION: Impacted, intra-articular distal radius fracture  She was placed in a sugar tong splint and given a sling.  Other x-rays were negative.  She has pain controlled.  She has used ice and the sling.    Review of Systems  Constitutional: Positive for activity change.  Musculoskeletal: Positive for arthralgias and joint swelling.  Neurological: Positive for headaches.  Psychiatric/Behavioral: The patient is nervous/anxious.   All other systems reviewed and are negative.  For Review of Systems, all other systems reviewed and are negative.  The following is a summary of the past history medically, past history surgically, known current medicines, social history and family history.  This information is gathered electronically by the computer from prior information and documentation.  I review this each visit and have found including this information at this point in the chart is beneficial and informative.   Past Medical History:  Diagnosis Date  . Anxiety   . Arthritis   . GERD (gastroesophageal reflux disease)   . Hypertension   . Migraine   . Panic attack   . Panic disorder   . UTI (lower urinary tract infection)   . Vertigo     Past Surgical History:  Procedure Laterality Date  . ABDOMINAL HYSTERECTOMY     partial  . BREAST BIOPSY     [facility]n  . BREAST SURGERY      Current Outpatient Medications on File Prior to Visit  Medication Sig Dispense Refill  . cyclobenzaprine (FLEXERIL) 10 MG tablet Take 1 tablet (10 mg total) by mouth 2 (two) times daily as needed for muscle spasms. 20 tablet 0  . DEXILANT 60 MG capsule Take 60 mg by mouth daily.     Marland Kitchen dicyclomine (BENTYL) 20 MG tablet Take 1 tablet (20 mg total) by mouth 3 (three) times daily as needed  (abdominal cramping). 20 tablet 0  . hydrOXYzine (ATARAX/VISTARIL) 25 MG tablet Take 1 tablet by mouth three times daily as needed anxiety.  2  . lisinopril (PRINIVIL,ZESTRIL) 5 MG tablet Take 0.5 tablets (2.5 mg total) by mouth daily. 30 tablet 0  . propranolol (INDERAL) 10 MG tablet Take 10 mg by mouth at bedtime.       No current facility-administered medications on file prior to visit.     Social History   Socioeconomic History  . Marital status: Legally Separated    Spouse name: Not on file  . Number of children: Not on file  . Years of education: Not on file  . Highest education level: Not on file  Occupational History  . Not on file  Social Needs  . Financial resource strain: Not on file  . Food insecurity:    Worry: Not on file    Inability: Not on file  . Transportation needs:    Medical: Not on file    Non-medical: Not on file  Tobacco Use  . Smoking status: Former Smoker    Packs/day: 2.00    Years: 30.00    Pack years: 60.00    Types: Cigarettes    Last attempt to quit: 04/01/2002    Years since quitting: 16.0  . Smokeless tobacco: Never Used  Substance and Sexual Activity  . Alcohol use: No  . Drug  use: No  . Sexual activity: Never    Birth control/protection: Surgical  Lifestyle  . Physical activity:    Days per week: Not on file    Minutes per session: Not on file  . Stress: Not on file  Relationships  . Social connections:    Talks on phone: Not on file    Gets together: Not on file    Attends religious service: Not on file    Active member of club or organization: Not on file    Attends meetings of clubs or organizations: Not on file    Relationship status: Not on file  . Intimate partner violence:    Fear of current or ex partner: Not on file    Emotionally abused: Not on file    Physically abused: Not on file    Forced sexual activity: Not on file  Other Topics Concern  . Not on file  Social History Narrative  . Not on file    Family  History  Problem Relation Age of Onset  . Hypertension Mother   . Diabetes Father     BP (!) 158/79   Pulse 80   Ht 5\' 8"  (1.727 m)   Wt 225 lb (102.1 kg)   BMI 34.21 kg/m   Body mass index is 34.21 kg/m.      Objective:   Physical Exam Constitutional:      Appearance: She is well-developed.  HENT:     Head: Normocephalic and atraumatic.  Eyes:     Conjunctiva/sclera: Conjunctivae normal.     Pupils: Pupils are equal, round, and reactive to light.  Neck:     Musculoskeletal: Normal range of motion and neck supple.  Cardiovascular:     Rate and Rhythm: Normal rate and regular rhythm.  Pulmonary:     Effort: Pulmonary effort is normal.  Abdominal:     Palpations: Abdomen is soft.  Musculoskeletal:     Left wrist: She exhibits decreased range of motion, tenderness, bony tenderness and swelling.       Arms:  Skin:    General: Skin is warm and dry.  Neurological:     Mental Status: She is alert and oriented to person, place, and time.     Cranial Nerves: No cranial nerve deficit.     Motor: No abnormal muscle tone.     Coordination: Coordination normal.     Deep Tendon Reflexes: Reflexes are normal and symmetric. Reflexes normal.  Psychiatric:        Behavior: Behavior normal.        Thought Content: Thought content normal.        Judgment: Judgment normal.      I have reviewed the x-rays, report and ER record.     Assessment & Plan:   Encounter Diagnosis  Name Primary?  . Other closed intra-articular fracture of distal end of left radius, initial encounter Yes   She is placed in a new more form fitting sugar tong splint on the left.  She is to continue the sling and ice as needed.  Return in one week.  X-rays then out of the splint.  Consider long arm cast then.  Call if any problem.  Precautions discussed.   Electronically Signed Sanjuana Kava, MD 1/22/20208:49 AM

## 2018-04-04 ENCOUNTER — Ambulatory Visit (INDEPENDENT_AMBULATORY_CARE_PROVIDER_SITE_OTHER): Payer: Medicare HMO

## 2018-04-04 ENCOUNTER — Encounter: Payer: Self-pay | Admitting: Orthopaedic Surgery

## 2018-04-04 ENCOUNTER — Ambulatory Visit (INDEPENDENT_AMBULATORY_CARE_PROVIDER_SITE_OTHER): Payer: Medicare HMO | Admitting: Orthopaedic Surgery

## 2018-04-04 VITALS — BP 151/76 | HR 72 | Ht 68.0 in | Wt 225.0 lb

## 2018-04-04 DIAGNOSIS — S52572D Other intraarticular fracture of lower end of left radius, subsequent encounter for closed fracture with routine healing: Secondary | ICD-10-CM

## 2018-04-04 NOTE — Progress Notes (Signed)
CC:  My wrist is still sore  She has been in the sugar tong splint.  She got the elbow part wet.  She prefers to stay in it than have a new long arm cast.  I will put in new sugar tong splint.  NV intact.  X-rays were done of the left wrist, reported separately.  Encounter Diagnosis  Name Primary?  . Other closed intra-articular fracture of distal end of left radius with routine healing, subsequent encounter Yes   Return in two weeks.  X-rays out of the splint.  Call if any problem.  Precautions discussed.   Electronically Signed Sanjuana Kava, MD 1/29/202010:11 AM

## 2018-04-18 ENCOUNTER — Encounter: Payer: Self-pay | Admitting: Orthopaedic Surgery

## 2018-04-18 ENCOUNTER — Ambulatory Visit (INDEPENDENT_AMBULATORY_CARE_PROVIDER_SITE_OTHER): Payer: Medicare HMO | Admitting: Orthopaedic Surgery

## 2018-04-18 ENCOUNTER — Ambulatory Visit (INDEPENDENT_AMBULATORY_CARE_PROVIDER_SITE_OTHER): Payer: Medicare HMO

## 2018-04-18 VITALS — BP 132/78 | HR 83 | Ht 68.0 in | Wt 228.0 lb

## 2018-04-18 DIAGNOSIS — S52572D Other intraarticular fracture of lower end of left radius, subsequent encounter for closed fracture with routine healing: Secondary | ICD-10-CM | POA: Diagnosis not present

## 2018-04-18 NOTE — Progress Notes (Signed)
CC:  My wrist is sore  She has been in a sugar tong splint.  She has no new trauma.  NV intact. She is not moving her fingers much and I have stressed to her to begin moving the fingers more and more.  I will place her in a cock-up splint.  X-rays were done of the left wrist, reported separately.  Encounter Diagnosis  Name Primary?  . Other closed intra-articular fracture of distal end of left radius with routine healing, subsequent encounter Yes   Return in two weeks.  X-rays on return.  Call if any problem.  Precautions discussed.   Electronically Signed Sanjuana Kava, MD 2/12/20209:33 AM

## 2018-05-02 ENCOUNTER — Ambulatory Visit (INDEPENDENT_AMBULATORY_CARE_PROVIDER_SITE_OTHER): Payer: Medicare HMO | Admitting: Orthopaedic Surgery

## 2018-05-02 ENCOUNTER — Ambulatory Visit (INDEPENDENT_AMBULATORY_CARE_PROVIDER_SITE_OTHER): Payer: Medicare HMO

## 2018-05-02 ENCOUNTER — Encounter: Payer: Self-pay | Admitting: Orthopaedic Surgery

## 2018-05-02 VITALS — BP 130/70 | HR 76 | Ht 68.0 in | Wt 228.0 lb

## 2018-05-02 DIAGNOSIS — S52572D Other intraarticular fracture of lower end of left radius, subsequent encounter for closed fracture with routine healing: Secondary | ICD-10-CM

## 2018-05-02 NOTE — Progress Notes (Signed)
CC:  My wrist is less painful  She has fracture of the left distal radius. She is moving her fingers better but she needs therapy to get full motion.  NV intact.  ROM limited of wrist.  X-rays were done of the left wrist, reported separately.  Encounter Diagnosis  Name Primary?  . Other closed intra-articular fracture of distal end of left radius with routine healing, subsequent encounter Yes   Begin PT/OT.  Return in three weeks.  X-rays of left wrist on return.  Call if any problem.  Precautions discussed.   Electronically Signed Sanjuana Kava, MD 2/26/20209:01 AM

## 2018-05-02 NOTE — Patient Instructions (Signed)
..  Occupational therapy has been ordered for you at [facility] [phone] is the phone number to call if you want to call to schedule. Please let us know if you do not hear anything within one week.

## 2018-05-09 ENCOUNTER — Telehealth: Payer: Self-pay | Admitting: Orthopaedic Surgery

## 2018-05-09 ENCOUNTER — Ambulatory Visit (HOSPITAL_COMMUNITY): Payer: Medicare HMO | Attending: Orthopaedic Surgery

## 2018-05-09 ENCOUNTER — Encounter (HOSPITAL_COMMUNITY): Payer: Self-pay

## 2018-05-09 ENCOUNTER — Other Ambulatory Visit: Payer: Self-pay

## 2018-05-09 DIAGNOSIS — M25532 Pain in left wrist: Secondary | ICD-10-CM | POA: Diagnosis not present

## 2018-05-09 DIAGNOSIS — R29898 Other symptoms and signs involving the musculoskeletal system: Secondary | ICD-10-CM

## 2018-05-09 DIAGNOSIS — R278 Other lack of coordination: Secondary | ICD-10-CM | POA: Diagnosis not present

## 2018-05-09 DIAGNOSIS — M25632 Stiffness of left wrist, not elsewhere classified: Secondary | ICD-10-CM | POA: Insufficient documentation

## 2018-05-09 MED ORDER — HYDROCODONE-ACETAMINOPHEN 5-325 MG PO TABS: ORAL_TABLET | ORAL | 0 refills | Status: DC

## 2018-05-09 NOTE — Telephone Encounter (Signed)
Patient just had therapy and is asking for something for pain. Stated therapist told her she would need to take something for pain about 45 minutes before she has therapy.  PATIENT USES [facility] [address]

## 2018-05-09 NOTE — Therapy (Signed)
[facility] [place], [place], [address] Phone: [phone]   Fax:  [phone]  Occupational Therapy Evaluation  Patient Details  Name: Emily Cooke MRN: [id number] Date of Birth: 1951/02/11 Referring Provider (OT): Sanjuana Kava, MD   Encounter Date: 05/09/2018  OT End of Session - 05/09/18 1251    Visit Number  1    Number of Visits  8    Date for OT Re-Evaluation  07/04/18    Authorization Type  1) [employer] medicare 2) medicaid    OT Start Time  [id number]    OT Stop Time  1036    OT Time Calculation (min)  48 min    Activity Tolerance  Patient tolerated treatment well    Behavior During Therapy  [facility] for tasks assessed/performed       Past Medical History:  Diagnosis Date  . Anxiety   . Arthritis   . GERD (gastroesophageal reflux disease)   . Hypertension   . Migraine   . Panic attack   . Panic disorder   . UTI (lower urinary tract infection)   . Vertigo     Past Surgical History:  Procedure Laterality Date  . ABDOMINAL HYSTERECTOMY     partial  . BREAST BIOPSY     benighn  . BREAST SURGERY      There were no vitals filed for this visit.  Subjective Assessment - 05/09/18 0959    Subjective   S: I fell by slipping on a mat I had put at the base of the steps outside because of the rain there was so much mud.    Pertinent History  Patient is a 68 y/o female S/P left distal radius fracture sustained from a mechanical fall on 03/25/18. Patient is currently in a resting hand splint and holding her arm in a tight protected position (elbow flexed and arm adducted). Dr. Luna Glasgow has referred patient to occupational therapy for evaluation and treatment.     Patient Stated Goals  To gain full use of her arm again.     Currently in Pain?  Yes    Pain Score  0-No pain        [facility] OT Assessment - 05/09/18 1001      Assessment   Medical Diagnosis  Left wrist fracture    Referring Provider (OT)  Sanjuana Kava, MD     Onset Date/Surgical Date  03/25/18    Hand Dominance  Right    Next MD Visit  05/23/18    Prior Therapy  None       Precautions   Precautions  Other (comment)    Precaution Comments  Patient reports that she was told to begin weaning from splint. Patient reports she is not to lift anything.      Required Braces or Orthoses  Other Brace/Splint      Restrictions   Weight Bearing Restrictions  Yes      Balance Screen   Has the patient fallen in the past 6 months  Yes    How many times?  1    Has the patient had a decrease in activity level because of a fear of falling?   No    Is the patient reluctant to leave their home because of a fear of falling?   No      Home  Environment   Family/patient expects to be discharged to:  Private residence    [place]  Lives With  Daughter      Prior Function   Level of Independence  Independent    Vocation  On disability      ADL   ADL comments  Patient is unable to complete any daily tasks using her LUE.       Mobility   Mobility Status  Independent      Written Expression   Dominant Hand  Right      Vision - History   Baseline Vision  Wears glasses only for reading      Cognition   Overall Cognitive Status  Within Functional Limits for tasks assessed      Sensation   Additional Comments  Patietnt reports diminished sensation in her left thumb and the volar aspect of her hand proximal to the wrist.       Coordination   9 Hole Peg Test  --   test next session     Edema   Edema  left wrist: 19.25 cm right wrist: 17.25 cm , right and left MCP joints: 21 cm      ROM / Strength   AROM / PROM / Strength  AROM;PROM;Strength      Palpation   Palpation comment  max fascial restrictions in the left volar hand, all digits, wrist and forearm.       AROM   Overall AROM Comments  Assessed seated.     AROM Assessment Site  Forearm;Elbow;Wrist    Right/Left Elbow  Left   Full passive and Active ROM.     Right/Left Forearm  Left    Left Forearm Pronation  90 Degrees    Left Forearm Supination  28 Degrees    Right/Left Wrist  Left    Left Wrist Extension  10 Degrees   right: 76   Left Wrist Flexion  42 Degrees   right 52"    Left Wrist Radial Deviation  14 Degrees   right:30   Left Wrist Ulnar Deviation  14 Degrees   right:34     PROM   Overall PROM Comments  Assessed seated.     PROM Assessment Site  Elbow;Forearm;Wrist    Right/Left Wrist  Left    Left Wrist Extension  16 Degrees    Left Wrist Flexion  34 Degrees    Left Wrist Radial Deviation  10 Degrees    Left Wrist Ulnar Deviation  14 Degrees      Strength   Overall Strength Comments  Assessed seated.     Strength Assessment Site  Elbow;Forearm;Wrist;Hand    Right/Left Forearm  Left    Left Forearm Pronation  3-/5    Left Forearm Supination  3/5    Right/Left Wrist  Left    Left Wrist Flexion  3-/5    Left Wrist Extension  3-/5    Left Wrist Radial Deviation  3-/5    Left Wrist Ulnar Deviation  3-/5    Right/Left hand  Left;Right    Right Hand Grip (lbs)  45    Right Hand Lateral Pinch  16 lbs    Right Hand 3 Point Pinch  12 lbs    Left Hand Grip (lbs)  1    Left Hand Lateral Pinch  0 lbs    Left Hand 3 Point Pinch  0 lbs                      OT Education - 05/09/18 1250    Education Details  wrist,  forearm, and hand A/ROM. Education to not remain in a protected position.    Person(s) Educated  Patient    Methods  Explanation;Demonstration;Verbal cues;Handout    Comprehension  Verbalized understanding       OT Short Term Goals - 05/09/18 1336      OT SHORT TERM GOAL #1   Title  Patient will be educated and independent with HEP in order to faciliate progress in the therapy and return to using her LUE as her non dominant when completing all daily tasks.     Time  4    Period  Weeks    Status  New    Target Date  06/06/18      OT SHORT TERM GOAL #2   Title  Patient will increase A/ROM of  her left wrist to [employer] [facility] in order to increase performance during grooming and self care tasks.     Time  4    Period  Weeks    Status  New      OT SHORT TERM GOAL #3   Title  Patient will increase left grip strength by 10# and pinch strength by 5# in order to open jars and containers using her Left hand.     Time  4    Period  Weeks    Status  New      OT SHORT TERM GOAL #4   Title  Patient will decrease pain level to 4/10 or less when completing her daily tasks with use of HEP and pain management techniques.     Time  4    Period  Weeks    Status  New      OT SHORT TERM GOAL #5   Title  Patient will increase her left hand coordination by completing the 9 hole peg test in 1 minute or less in order to manipulate household items without dropping.     Time  4    Period  Weeks    Status  New      Additional Short Term Goals   Additional Short Term Goals  Yes      OT SHORT TERM GOAL #6   Title  Patient will increase her left wrist strength to 4-/5 in order to be able to pick up light weight household items with less difficulty.    Time  4    Period  Weeks    Status  New      OT SHORT TERM GOAL #7   Title  Patient will decrease any fascial restrictions to min amount or less in her left hand, wrist, and forearm in order to increase functional mobility needed to complete daily tasks.     Time  4    Period  Weeks    Status  New               Plan - 05/09/18 1252    Clinical Impression Statement  A: Patient is a [age] y/o female S/P left wrist fracture causing increased pain, fascial restrictions, and decreased ROM and strength resulting in difficulty completing daily tasks using her left hand as her non dominant.     OT Occupational Profile and History  Detailed Assessment- Review of Records and additional review of physical, cognitive, psychosocial history related to current functional performance    Occupational performance deficits (Please refer to evaluation for details):   ADL's;IADL's;Rest and Sleep;Leisure    Body Structure / Function / Physical Skills  ADL;Decreased knowledge of use  of DME;Strength;Pain;UE functional use;ROM;IADL;Fascial restriction;Sensation;Mobility;Flexibility;Coordination;FMC;Decreased knowledge of precautions    Psychosocial Skills  Coping Strategies;Habits    Rehab Potential  Excellent    Clinical Decision Making  Multiple treatment options, significant modification of task necessary    Comorbidities Affecting Occupational Performance:  Presence of comorbidities impacting occupational performance    Comorbidities impacting occupational performance description:  Anxiety and panic disorder    Modification or Assistance to Complete Evaluation   Min-Moderate modification of tasks or assist with assess necessary to complete eval    OT Frequency  2x / week    OT Duration  4 weeks    OT Treatment/Interventions  Self-care/ADL training;Moist Heat;Electrical Stimulation;Cryotherapy;Energy conservation;Manual Therapy;Functional Mobility Training;Neuromuscular education;Passive range of motion;Patient/family education;Coping strategies training;Therapeutic exercise;Ultrasound;Therapeutic activities;DME and/or AE instruction;Splinting    Plan  P:Patient will benefit from skilled OT services to increase functional performance during ADL tasks while using her LUE. Treatment Plan: yofascial release, manual stretching, edema management, coping strategies, pain management, A/ROM, AA/ROM, general strengthening, grip and pinch strengthening, coordination.     Consulted and Agree with Plan of Care  Patient       Patient will benefit from skilled therapeutic intervention in order to improve the following deficits and impairments:  Body Structure / Function / Physical Skills, Psychosocial Skills  Visit Diagnosis: Other symptoms and signs involving the musculoskeletal system  Pain in left wrist  Stiffness of left wrist, not elsewhere classified  Other lack  of coordination    Problem List There are no active problems to display for this patient.  Ailene Ravel, OTR/L,CBIS  [phone]  05/09/2018, 1:50 PM  [facility] [address] [place], [place], [address] Phone: [phone]   Fax:  [phone]  Name: Emily Cooke MRN: [id number] Date of Birth: 04-11-1950

## 2018-05-09 NOTE — Patient Instructions (Signed)
Wash you left arm from wrist to elbow with warm water and a washcloth and gently remove all dead skin. Apply lotion.   Work on not keeping your arm in a protected position. Let it be relaxed on you lap or at your side.   Complete these exercise at least 3-5 times a day.  ELBOW FLEXION EXTENSION  Bend your elbow upwards as shown and then lower to a straighten position. Complete 10 times. 1 set.      AROM: Wrist Flexion / Extension  Actively bend right wrist forward then back as far as possible. Repeat __10__ times per set. Do _1___ sets per session. Do ____ sessions per day.  Copyright  VHI. All rights reserved.  AROM: Wrist Radial / Ulnar Deviation   Gently bend left wrist from side to side as far as possible. Repeat _10___ times per set. Do __1__ sets per session. Do ____ sessions per day.  Copyright  VHI. All rights reserved.  AROM: Forearm Pronation / Supination   With right arm in handshake position, slowly rotate palm down until stretch is felt. Relax. Then rotate palm up until stretch is felt. Repeat __10__ times per set. Do ___1_ sets per session. Do ____ sessions per day.  Copyright  VHI. All rights reserved.     AROM: Finger Flexion / Extension   Actively bend fingers of right hand. Start with knuckles furthest from palm, and slowly make a fist. Hold __5__ seconds. Relax. Then straighten fingers as far as possible. Repeat __10__ times per set. Do __1__ sets per session. Do ____ sessions per day.  Copyright  VHI. All rights reserved.   Paper Crumpling Exercise   Begin with right palm down on piece of paper. Maintaining contact between surface and heel of hand, crumple paper into a ball. Repeat __5-10__ times per set. Do ___1_ sets per session. Do ____ sessions per day.  Copyright  VHI. All rights reserved.     Towel Roll Squeeze   With right forearm resting on surface, gently squeeze towel. Repeat _10___ times per set. Do _1___ sets per session.  Do ____ sessions per day.  Copyright  VHI. All rights reserved.   Abduction / Adduction (Active)    With hand flat on table, spread all fingers apart, then bring them together as close as possible. Repeat _10___ times. Do ____ sessions per day.  Copyright  VHI. All rights reserved.  AROM: Thumb Abduction / Adduction   Actively pull right thumb away from palm as far as possible. Hold ____ seconds. Then bring thumb back to touch fingers. Try not to bend fingers toward thumb. Repeat __10__ times per set. Do __1__ sets per session. Do ____ sessions per day.  Copyright  VHI. All rights reserved.

## 2018-05-15 ENCOUNTER — Telehealth (HOSPITAL_COMMUNITY): Payer: Self-pay

## 2018-05-15 NOTE — Telephone Encounter (Signed)
Cx Friday -Lack of transporation

## 2018-05-18 ENCOUNTER — Ambulatory Visit (HOSPITAL_COMMUNITY): Payer: Medicare HMO

## 2018-05-21 ENCOUNTER — Other Ambulatory Visit (HOSPITAL_COMMUNITY): Payer: Self-pay | Admitting: Internal Medicine

## 2018-05-21 DIAGNOSIS — K219 Gastro-esophageal reflux disease without esophagitis: Secondary | ICD-10-CM | POA: Diagnosis not present

## 2018-05-21 DIAGNOSIS — S6292XD Unspecified fracture of left wrist and hand, subsequent encounter for fracture with routine healing: Secondary | ICD-10-CM | POA: Diagnosis not present

## 2018-05-21 DIAGNOSIS — I10 Essential (primary) hypertension: Secondary | ICD-10-CM | POA: Diagnosis not present

## 2018-05-21 DIAGNOSIS — Z6834 Body mass index (BMI) 34.0-34.9, adult: Secondary | ICD-10-CM | POA: Diagnosis not present

## 2018-05-21 DIAGNOSIS — Z780 Asymptomatic menopausal state: Secondary | ICD-10-CM

## 2018-05-21 DIAGNOSIS — Z13820 Encounter for screening for osteoporosis: Secondary | ICD-10-CM

## 2018-05-23 ENCOUNTER — Ambulatory Visit (INDEPENDENT_AMBULATORY_CARE_PROVIDER_SITE_OTHER): Payer: Medicare HMO | Admitting: Orthopaedic Surgery

## 2018-05-23 ENCOUNTER — Encounter: Payer: Self-pay | Admitting: Orthopaedic Surgery

## 2018-05-23 ENCOUNTER — Ambulatory Visit (INDEPENDENT_AMBULATORY_CARE_PROVIDER_SITE_OTHER): Payer: Medicare HMO

## 2018-05-23 ENCOUNTER — Other Ambulatory Visit: Payer: Self-pay

## 2018-05-23 VITALS — BP 139/70 | HR 69 | Ht 68.0 in | Wt 222.0 lb

## 2018-05-23 DIAGNOSIS — S52572D Other intraarticular fracture of lower end of left radius, subsequent encounter for closed fracture with routine healing: Secondary | ICD-10-CM

## 2018-05-23 NOTE — Progress Notes (Signed)
CC:  My wrist is better  She went to OT for one visit and was given exercises to do.  She has much less swelling and better motion of the fingers of the left hand.  She is still wearing her cock-up splint.  I told her to come out of it.  Wean out of it.  Return in three weeks.  X-rays were done, reported separately, of the left wrist.  Fracture healed.  Call if any problem.  Precautions discussed.   Encounter Diagnosis  Name Primary?  . Other closed intra-articular fracture of distal end of left radius with routine healing, subsequent encounter Yes   Electronically Signed Sanjuana Kava, MD 3/18/20208:54 AM

## 2018-05-24 ENCOUNTER — Telehealth (HOSPITAL_COMMUNITY): Payer: Self-pay | Admitting: Occupational Therapy

## 2018-05-24 NOTE — Telephone Encounter (Signed)
l/m for pt NOT to come in the morning to her apptment and that a Therapist will be calling her by noon tomorrow. NF 05/24/2018

## 2018-05-25 ENCOUNTER — Ambulatory Visit (HOSPITAL_COMMUNITY): Payer: Medicare HMO | Admitting: Occupational Therapy

## 2018-05-29 DIAGNOSIS — A084 Viral intestinal infection, unspecified: Secondary | ICD-10-CM | POA: Diagnosis not present

## 2018-05-31 ENCOUNTER — Ambulatory Visit (HOSPITAL_COMMUNITY): Payer: Medicare HMO

## 2018-06-01 ENCOUNTER — Telehealth (HOSPITAL_COMMUNITY): Payer: Self-pay | Admitting: Occupational Therapy

## 2018-06-01 NOTE — Telephone Encounter (Signed)
Called and left message for pt to follow up on wrist and HEP. Asked pt to call back to discuss HEP and provide any necessary follow up exercises.    Guadelupe Sabin, OTR/L  [phone] 06/01/2018

## 2018-06-04 ENCOUNTER — Encounter (HOSPITAL_COMMUNITY): Payer: Medicare HMO

## 2018-06-05 ENCOUNTER — Other Ambulatory Visit (HOSPITAL_COMMUNITY): Payer: Medicare HMO

## 2018-06-06 ENCOUNTER — Telehealth (HOSPITAL_COMMUNITY): Payer: Self-pay

## 2018-06-06 NOTE — Telephone Encounter (Signed)
Left message regarding clinic closure through 06/22/18. Patient will receive a follow up phone call to schedule 1 visit to update HEP.   Ailene Ravel, OTR/L,CBIS  [phone]

## 2018-06-07 ENCOUNTER — Encounter (HOSPITAL_COMMUNITY): Payer: Medicare HMO

## 2018-06-12 ENCOUNTER — Ambulatory Visit (HOSPITAL_COMMUNITY): Payer: Medicare HMO

## 2018-06-13 ENCOUNTER — Other Ambulatory Visit: Payer: Self-pay

## 2018-06-13 ENCOUNTER — Encounter: Payer: Self-pay | Admitting: Orthopaedic Surgery

## 2018-06-13 ENCOUNTER — Ambulatory Visit (INDEPENDENT_AMBULATORY_CARE_PROVIDER_SITE_OTHER): Payer: Medicare HMO | Admitting: Orthopaedic Surgery

## 2018-06-13 DIAGNOSIS — S52572D Other intraarticular fracture of lower end of left radius, subsequent encounter for closed fracture with routine healing: Secondary | ICD-10-CM | POA: Diagnosis not present

## 2018-06-13 NOTE — Progress Notes (Signed)
Virtual Visit via Telephone Note  I connected with Emily Cooke on 06/13/18 at  9:00 AM EDT by telephone and verified that I am speaking with the correct person using two identifiers.   I discussed the limitations, risks, security and privacy concerns of performing an evaluation and management service by telephone and the availability of in person appointments. I also discussed with the patient that there may be a patient responsible charge related to this service. The patient expressed understanding and agreed to proceed.   History of Present Illness: She has fracture of the left wrist and then had swelling and pain of the fingers.  The fracture had healed and she went to OT for therapy.  She is much better, she has no swelling, near full ROM of the wrist and the hand she lacks just "a small bit" from having a full normal grip.  She complains of numbness at times of the little finger. She has no new trauma. She is pleased with her progress.   Observations/Objective: Per above   Assessment and Plan: Encounter Diagnosis  Name Primary?  . Other closed intra-articular fracture of distal end of left radius with routine healing, subsequent encounter Yes     Follow Up Instructions: I will see as needed.   I discussed the assessment and treatment plan with the patient. The patient was provided an opportunity to ask questions and all were answered. The patient agreed with the plan and demonstrated an understanding of the instructions.   The patient was advised to call back or seek an in-person evaluation if the symptoms worsen or if the condition fails to improve as anticipated.  I provided 5 minutes of non-face-to-face time during this encounter.   Sanjuana Kava, MD

## 2018-06-14 ENCOUNTER — Encounter (HOSPITAL_COMMUNITY): Payer: Medicare HMO

## 2018-07-03 DIAGNOSIS — H6692 Otitis media, unspecified, left ear: Secondary | ICD-10-CM | POA: Diagnosis not present

## 2018-07-06 ENCOUNTER — Other Ambulatory Visit: Payer: Self-pay

## 2018-07-06 ENCOUNTER — Emergency Department (HOSPITAL_COMMUNITY)
Admission: EM | Admit: 2018-07-06 | Discharge: 2018-07-07 | Disposition: A | Discharge status: Home / Self Care | Payer: Medicare HMO | Attending: Emergency Medicine | Admitting: Emergency Medicine

## 2018-07-06 DIAGNOSIS — H9202 Otalgia, left ear: Secondary | ICD-10-CM | POA: Insufficient documentation

## 2018-07-06 DIAGNOSIS — H6122 Impacted cerumen, left ear: Secondary | ICD-10-CM | POA: Insufficient documentation

## 2018-07-06 DIAGNOSIS — I10 Essential (primary) hypertension: Secondary | ICD-10-CM | POA: Diagnosis not present

## 2018-07-06 DIAGNOSIS — Z79899 Other long term (current) drug therapy: Secondary | ICD-10-CM | POA: Diagnosis not present

## 2018-07-06 DIAGNOSIS — Z87891 Personal history of nicotine dependence: Secondary | ICD-10-CM | POA: Diagnosis not present

## 2018-07-06 DIAGNOSIS — H9201 Otalgia, right ear: Secondary | ICD-10-CM | POA: Diagnosis not present

## 2018-07-06 DIAGNOSIS — H6121 Impacted cerumen, right ear: Secondary | ICD-10-CM | POA: Diagnosis not present

## 2018-07-07 ENCOUNTER — Other Ambulatory Visit: Payer: Self-pay

## 2018-07-07 ENCOUNTER — Encounter (HOSPITAL_COMMUNITY): Payer: Self-pay

## 2018-07-07 DIAGNOSIS — H9202 Otalgia, left ear: Secondary | ICD-10-CM | POA: Diagnosis not present

## 2018-07-07 MED ORDER — NEOMYCIN-POLYMYXIN-HC 1 % OT SOLN
4.0000 [drp] | Freq: Four times a day (QID) | OTIC | Status: DC
  Administered 2018-07-07: 4 [drp] via OTIC
  Filled 2018-07-07: qty 10

## 2018-07-07 NOTE — ED Provider Notes (Signed)
[facility] EMERGENCY DEPARTMENT Provider Note   CSN: [id number] Arrival date & time: 07/06/18  2357    History   Chief Complaint Chief Complaint  Patient presents with  . Otalgia    HPI Emily Cooke is a 68 y.o. female.     Patient presents to the ER for evaluation of left ear pain.  She reports her symptoms have been present for 3 days.  She reports an intermittent sharp stabbing pain in the left ear without drainage, bleeding or hearing loss.  She denies trauma.     Past Medical History:  Diagnosis Date  . Anxiety   . Arthritis   . GERD (gastroesophageal reflux disease)   . Hypertension   . Migraine   . Panic attack   . Panic disorder   . UTI (lower urinary tract infection)   . Vertigo     There are no active problems to display for this patient.   Past Surgical History:  Procedure Laterality Date  . ABDOMINAL HYSTERECTOMY     partial  . BREAST BIOPSY     benighn  . BREAST SURGERY       OB History    Gravida  4   Para  3   Term  1   Preterm  2   AB  1   Living  3     SAB  1   TAB      Ectopic      Multiple      Live Births               Home Medications    Prior to Admission medications   Medication Sig Start Date End Date Taking? Authorizing Provider  cyclobenzaprine (FLEXERIL) 10 MG tablet Take 1 tablet (10 mg total) by mouth 2 (two) times daily as needed for muscle spasms. 03/25/18   [name], MD  DEXILANT 60 MG capsule Take 60 mg by mouth daily.  05/31/16   [provider]  dicyclomine (BENTYL) 20 MG tablet Take 1 tablet (20 mg total) by mouth 3 (three) times daily as needed (abdominal cramping). 02/15/18   Mesner, Corene Cornea, MD  HYDROcodone-acetaminophen (NORCO/VICODIN) 5-325 MG tablet One tablet every four hours as needed for acute pain.  Limit of five days per [place]. 05/09/18   Sanjuana Kava, MD  hydrOXYzine (ATARAX/VISTARIL) 25 MG tablet Take 1 tablet by mouth three times daily as needed  anxiety. 01/12/15   [provider]  lisinopril (PRINIVIL,ZESTRIL) 5 MG tablet Take 0.5 tablets (2.5 mg total) by mouth daily. 04/09/14   [name], MD  propranolol (INDERAL) 10 MG tablet Take 10 mg by mouth at bedtime.      [provider]    Family History Family History  Problem Relation Age of Onset  . Hypertension Mother   . Diabetes Father     Social History Social History   Tobacco Use  . Smoking status: Former Smoker    Packs/day: 2.00    Years: 30.00    Pack years: 60.00    Types: Cigarettes    Last attempt to quit: 04/01/2002    Years since quitting: 16.2  . Smokeless tobacco: Never Used  Substance Use Topics  . Alcohol use: No  . Drug use: No     Allergies   Cephalexin; Ibuprofen; Amoxicillin; Bactrim [sulfamethoxazole-trimethoprim]; Ciprofloxacin; Imitrex [sumatriptan succinate]; Morphine and related; Tetracyclines & related; and Zoloft [sertraline hcl]   Review of Systems Review of Systems  HENT: Positive  for ear pain.   All other systems reviewed and are negative.    Physical Exam Updated Vital Signs BP (!) 166/83 (BP Location: Left Arm)   Pulse 94   Temp 97.7 F (36.5 C) (Oral)   Resp 18   Ht 5\' 8"  (1.727 m)   Wt 101.6 kg   SpO2 100%   BMI 34.06 kg/m   Physical Exam Vitals signs and nursing note reviewed.  Constitutional:      General: She is not in acute distress.    Appearance: Normal appearance. She is well-developed.  HENT:     Head: Normocephalic and atraumatic.     Right Ear: Hearing normal.     Left Ear: Hearing and tympanic membrane normal. There is impacted cerumen.     Nose: Nose normal.  Eyes:     Conjunctiva/sclera: Conjunctivae normal.     Pupils: Pupils are equal, round, and reactive to light.  Neck:     Musculoskeletal: Normal range of motion and neck supple.  Cardiovascular:     Rate and Rhythm: Regular rhythm.     Heart sounds: S1 normal and S2 normal. No murmur. No friction rub. No gallop.    Pulmonary:     Effort: Pulmonary effort is normal. No respiratory distress.     Breath sounds: Normal breath sounds.  Chest:     Chest wall: No tenderness.  Abdominal:     General: Bowel sounds are normal.     Palpations: Abdomen is soft.     Tenderness: There is no abdominal tenderness. There is no guarding or rebound. Negative signs include Murphy's sign and McBurney's sign.     Hernia: No hernia is present.  Musculoskeletal: Normal range of motion.  Skin:    General: Skin is warm and dry.     Findings: No rash.  Neurological:     Mental Status: She is alert and oriented to person, place, and time.     GCS: GCS eye subscore is 4. GCS verbal subscore is 5. GCS motor subscore is 6.     Cranial Nerves: No cranial nerve deficit.     Sensory: No sensory deficit.     Coordination: Coordination normal.  Psychiatric:        Speech: Speech normal.        Behavior: Behavior normal.        Thought Content: Thought content normal.      ED Treatments / Results  Labs (all labs ordered are listed, but only abnormal results are displayed) Labs Reviewed - No data to display  EKG None  Radiology No results found.  Procedures Procedures (including critical care time)  Medications Ordered in ED Medications  NEOMYCIN-POLYMYXIN-HYDROCORTISONE (CORTISPORIN) OTIC (EAR) solution 4 drop (has no administration in time range)     Initial Impression / Assessment and Plan / ED Course  I have reviewed the triage vital signs and the nursing notes.  Pertinent labs & imaging results that were available during my care of the patient were reviewed by me and considered in my medical decision making (see chart for details).        Left cerumen impaction removed with curette.  Repeat examination reveals minimal inflammation of the external canal.  Tympanic membrane is normal, no sign of otitis media.  Treat with analgesia, Cortisporin otic drops.  Continue earr care at home.  Final Clinical  Impressions(s) / ED Diagnoses   Final diagnoses:  Otalgia of left ear  Impacted cerumen of left ear  ED Discharge Orders    None       Aundray Cartlidge, Gwenyth Allegra, MD 07/07/18 [phone]

## 2018-07-07 NOTE — ED Notes (Signed)
ED Provider at bedside. 

## 2018-07-07 NOTE — ED Triage Notes (Signed)
Left ear pain x 3 days, denies drainage.

## 2018-07-13 ENCOUNTER — Telehealth (HOSPITAL_COMMUNITY): Payer: Self-pay | Admitting: Occupational Therapy

## 2018-07-13 DIAGNOSIS — S90851A Superficial foreign body, right foot, initial encounter: Secondary | ICD-10-CM | POA: Diagnosis not present

## 2018-07-13 NOTE — Telephone Encounter (Signed)
Left message to follow up on wrist functioning and offer in-clinic reassessment if needed. Asked pt to return call to schedule or discharge.    Guadelupe Sabin, OTR/L  [phone] 07/13/2018

## 2018-07-18 ENCOUNTER — Other Ambulatory Visit: Payer: Self-pay

## 2018-07-18 ENCOUNTER — Encounter: Payer: Self-pay | Admitting: Orthopaedic Surgery

## 2018-07-18 ENCOUNTER — Ambulatory Visit (INDEPENDENT_AMBULATORY_CARE_PROVIDER_SITE_OTHER): Payer: Medicare HMO | Admitting: Orthopaedic Surgery

## 2018-07-18 DIAGNOSIS — S52572D Other intraarticular fracture of lower end of left radius, subsequent encounter for closed fracture with routine healing: Secondary | ICD-10-CM | POA: Diagnosis not present

## 2018-07-18 NOTE — Progress Notes (Signed)
Virtual Visit via Telephone Note  I connected with Emily Cooke on 07/18/18 at  9:20 AM EDT by telephone and verified that I am speaking with the correct person using two identifiers.  Location: Patient: home Provider: home   I discussed the limitations, risks, security and privacy concerns of performing an evaluation and management service by telephone and the availability of in person appointments. I also discussed with the patient that there may be a patient responsible charge related to this service. The patient expressed understanding and agreed to proceed.   History of Present Illness: She has had fracture of the wrist on the left.  She has numbness now of the little finger and part of the ring finger that will not go away.  I am concerned about a possible ulnar nerve injury.  I will see about getting EMG study.  I will see her in the office after this is done.   Observations/Objective: Per above Assessment and Plan: Encounter Diagnosis  Name Primary?  . Other closed intra-articular fracture of distal end of left radius with routine healing, subsequent encounter Yes     Follow Up Instructions: Get EMG then see in office. Continue exercises of hand at home.   I discussed the assessment and treatment plan with the patient. The patient was provided an opportunity to ask questions and all were answered. The patient agreed with the plan and demonstrated an understanding of the instructions.   The patient was advised to call back or seek an in-person evaluation if the symptoms worsen or if the condition fails to improve as anticipated.  I provided 8 minutes of non-face-to-face time during this encounter.   Sanjuana Kava, MD

## 2018-07-23 ENCOUNTER — Telehealth: Payer: Self-pay

## 2018-07-23 NOTE — Telephone Encounter (Signed)
Called pt to inquire on getting appointment scheduled for EMG requested by Dr. Luna Glasgow. Let pt know that [facility] would be our best option for getting that scheduled at this time. Pt states that she can't get there at this time and would like to give PT another chance before she actually attempts to schedule EMG's.

## 2018-07-25 ENCOUNTER — Encounter (HOSPITAL_COMMUNITY): Payer: Self-pay

## 2018-07-25 ENCOUNTER — Ambulatory Visit (HOSPITAL_COMMUNITY): Payer: Medicare HMO | Attending: Orthopaedic Surgery

## 2018-07-25 ENCOUNTER — Other Ambulatory Visit: Payer: Self-pay

## 2018-07-25 DIAGNOSIS — M25532 Pain in left wrist: Secondary | ICD-10-CM | POA: Insufficient documentation

## 2018-07-25 DIAGNOSIS — R278 Other lack of coordination: Secondary | ICD-10-CM | POA: Insufficient documentation

## 2018-07-25 DIAGNOSIS — R29898 Other symptoms and signs involving the musculoskeletal system: Secondary | ICD-10-CM

## 2018-07-25 DIAGNOSIS — M25632 Stiffness of left wrist, not elsewhere classified: Secondary | ICD-10-CM | POA: Insufficient documentation

## 2018-07-25 NOTE — Therapy (Signed)
Emily Cooke, [place], [address] Phone: [phone]   Fax:  [phone]  Occupational Therapy Re-evaluation/Re-cert  Patient Details  Name: Emily Cooke MRN: [id number] Date of Birth: [birth year] Referring Provider (OT): Sanjuana Kava, MD   Encounter Date: 07/25/2018  OT End of Session - 07/25/18 1313    Visit Number  1    Number of Visits  8    Date for OT Re-Evaluation  08/22/18    Authorization Type  1) [employer] medicare 2) medicaid    OT Start Time  1130   re-eval   OT Stop Time  1220    OT Time Calculation (min)  50 min    Activity Tolerance  Patient tolerated treatment well    Behavior During Therapy  [facility] for tasks assessed/performed       Past Medical History:  Diagnosis Date  . Anxiety   . Arthritis   . GERD (gastroesophageal reflux disease)   . Hypertension   . Migraine   . Panic attack   . Panic disorder   . UTI (lower urinary tract infection)   . Vertigo     Past Surgical History:  Procedure Laterality Date  . ABDOMINAL HYSTERECTOMY     partial  . BREAST BIOPSY     benighn  . BREAST SURGERY      There were no vitals filed for this visit.  Subjective Assessment - 07/25/18 1307    Subjective   S: I've been working on it a lot and doing my exercises. It will sometimes hurt if I use it a lot and I can't make a fist or hold onto things because I don't have the strength.     Currently in Pain?  No/denies         [facility] OT Assessment - 07/25/18 1136      Assessment   Medical Diagnosis  Left wrist fracture    Referring Provider (OT)  Sanjuana Kava, MD    Onset Date/Surgical Date  03/25/18    Hand Dominance  Right    Prior Therapy  None       Precautions   Precautions  None      Restrictions   Weight Bearing Restrictions  No      ADL   ADL comments  Pt reports difficulty with forming a fist, hand strength as she is unable to hold onto items without dropping it.       Sensation   Additional Comments  Pt reports decreased sensation in her 4th and 5th digit.       Coordination   9 Hole Peg Test  Right;Left    Right 9 Hole Peg Test  22.0"    Left 9 Hole Peg Test  25.8"      Edema   Edema  left wrist: 18 cm, left MCP joints: 21 cm, Left PIP joints: 18.5cm right: 18cm      ROM / Strength   AROM / PROM / Strength  AROM;PROM;Strength      Palpation   Palpation comment  min fascial restrictions/edema noted in 2nd-5th digits (DIP, PIP, MCP)      AROM   Overall AROM Comments  Assessed seated.     AROM Assessment Site  Forearm;Wrist;Elbow    Right/Left Elbow  Left    Right/Left Forearm  Left    Left Forearm Supination  88 Degrees   previous: 28   Right/Left Wrist  Left    Left Wrist  Extension  54 Degrees   previuos: 10   Left Wrist Flexion  64 Degrees   previous: 42   Left Wrist Radial Deviation  18 Degrees   previous: 14   Left Wrist Ulnar Deviation  34 Degrees   previous: 14     PROM   Overall PROM Comments  Assessed seated.     PROM Assessment Site  Wrist    Right/Left Wrist  Left    Left Wrist Extension  64 Degrees   previous: 16   Left Wrist Flexion  66 Degrees   previous: 34   Left Wrist Radial Deviation  20 Degrees   previous: 10   Left Wrist Ulnar Deviation  40 Degrees   previous; 14     Strength   Overall Strength Comments  Assessed seated.     Strength Assessment Site  Elbow;Forearm;Wrist;Hand    Right/Left Forearm  Left    Left Forearm Pronation  3+/5   previous: 3-/5   Left Forearm Supination  4+/5   previous: 3/5   Right/Left Wrist  Left    Left Wrist Flexion  5/5   previous: 3-.5   Left Wrist Extension  5/5   previous: 3-/5   Left Wrist Radial Deviation  5/5   previous: 3-/5   Left Wrist Ulnar Deviation  5/5   previous: 3-/5   Right/Left hand  Left    Right Hand Grip (lbs)  55    Left Hand Grip (lbs)  5   previous: 1   Left Hand Lateral Pinch  3 lbs   previous: 0   Left Hand 3 Point Pinch  3 lbs   previous:  0                      OT Education - 07/25/18 1313    Education Details  HEP upgraded. patient is to stop previous HEP provided. Updated HEP consists of yellow theraputty and fine motor coordiation activities. Left small/medium flexion glove provided. Wear for 60mins-1 hr every 2 hrs as able.     Person(s) Educated  Patient    Methods  Explanation;Demonstration;Handout;Verbal cues    Comprehension  Verbalized understanding       OT Short Term Goals - 07/25/18 1155      OT SHORT TERM GOAL #1   Title  Patient will be educated and independent with HEP in order to faciliate progress in the therapy and return to using her LUE as her non dominant when completing all daily tasks.     Time  4    Period  Weeks    Status  On-going    Target Date  08/22/18      OT SHORT TERM GOAL #2   Title  Patient will increase A/ROM for left wrist flexion/extension in addition to her hand (digits 1-5) to [facility] in order to increase performance during grooming and self care tasks while also being able to form a complete fist.     Time  4    Period  Weeks    Status  Revised      OT SHORT TERM GOAL #3   Title  Patient will increase left grip strength by 10# and pinch strength by 5# in order to open jars and containers using her Left hand.     Time  4    Period  Weeks    Status  On-going      OT SHORT TERM GOAL #4   Title  Patient will decrease pain level to 4/10 or less when completing her daily tasks with use of HEP and pain management techniques.     Time  4    Period  Weeks    Status  Achieved      OT SHORT TERM GOAL #5   Title  Patient will increase her left hand coordination by completing the 9 hole peg test in 20 seconds or less in order to manipulate household items without dropping.     Time  4    Period  Weeks    Status  Revised      Additional Short Term Goals   Additional Short Term Goals  Yes      OT SHORT TERM GOAL #6   Title  Patient will increase her left wrist  supination and pronation strength to 4+/5 in order to be able to pick up light weight household items with less difficulty.    Time  4    Period  Weeks    Status  Revised      OT SHORT TERM GOAL #7   Title  Patient will decrease any fascial restrictions to min amount or less in her left hand, wrist, and forearm in order to increase functional mobility needed to complete daily tasks.     Time  4    Period  Weeks    Status  Achieved               Plan - 07/25/18 1314    Clinical Impression Statement  A: Re-evaluation completed this date as patient has been unable to complete telehealth or in clinic visits for 2 months due to COVID-19. Patient has made great progress while working on her HEP independently although continues to have deficits including wrist strength, wrist A/ROM, grip and pinch strength, fine and gross motor coordination resulting in difficulty utilzing her left hand for all daily tasks. HEP was upgraded and goals were reviewed. After 2 months, patient did meet 4 therapy goals although will require upgraded goals to focus on more advance tasks and increase her ability to use her left hand as her normal nondominant extremity.     OT Occupational Profile and History  Detailed Assessment- Review of Records and additional review of physical, cognitive, psychosocial history related to current functional performance    Occupational performance deficits (Please refer to evaluation for details):  ADL's;IADL's;Rest and Sleep;Leisure    Body Structure / Function / Physical Skills  ADL;Decreased knowledge of use of DME;Strength;Pain;UE functional use;ROM;IADL;Fascial restriction;Sensation;Mobility;Flexibility;Coordination;FMC;Decreased knowledge of precautions;GMC;Dexterity    Rehab Potential  Excellent    Clinical Decision Making  Several treatment options, min-mod task modification necessary    Comorbidities Affecting Occupational Performance:  Presence of comorbidities impacting  occupational performance    Comorbidities impacting occupational performance description:  Anxiety and panic disorder    Modification or Assistance to Complete Evaluation   Min-Moderate modification of tasks or assist with assess necessary to complete eval    OT Frequency  2x / week    OT Duration  4 weeks    OT Treatment/Interventions  Self-care/ADL training;Moist Heat;Electrical Stimulation;Cryotherapy;Energy conservation;Manual Therapy;Functional Mobility Training;Neuromuscular education;Passive range of motion;Patient/family education;Coping strategies training;Therapeutic exercise;Ultrasound;Therapeutic activities;DME and/or AE instruction;Splinting;Paraffin    Plan  P: Patient will continue to benefit from skilled OT services to increase functional performance during ADL tasks while returing to using her left UE as her nondominant extremity. Treatment plan: Hand A/ROM and P/ROM, flexion glove, girp and pinch strengthening, wrist strengthening, stretching,  and A/ROM, gross and fine motor coordination tasks.     Consulted and Agree with Plan of Care  Patient       Patient will benefit from skilled therapeutic intervention in order to improve the following deficits and impairments:  Body Structure / Function / Physical Skills  Visit Diagnosis: Other symptoms and signs involving the musculoskeletal system - Plan: Ot plan of care cert/re-cert  Pain in left wrist - Plan: Ot plan of care cert/re-cert  Stiffness of left wrist, not elsewhere classified - Plan: Ot plan of care cert/re-cert  Other lack of coordination - Plan: Ot plan of care cert/re-cert    Problem List There are no active problems to display for this patient.  Ailene Ravel, OTR/L,CBIS  [phone]  07/25/2018, 1:27 PM  [facility] [address] [place], [place], [address] Phone: [phone]   Fax:  [phone]  Name: JAKAYLA SCHWEPPE MRN: [id number] Date of Birth:  11/03/1950

## 2018-07-25 NOTE — Patient Instructions (Signed)
Home Exercises Program Theraputty Exercises  Do the following exercises 2-3 times a day using your affected left hand.  1. Roll putty into a ball.  2. Make into a pancake.  3. Roll putty into a roll.  4. Pinch along log with first finger and thumb.   5. Make into a ball.  6. Roll it back into a log.   7. Pinch using thumb and side of first finger.  8. Roll into a ball, then flatten into a pancake.  9. Using your fingers, make putty into a mountain.   Coordination Exercises  Perform the following exercises for 15-30 minutes 2 times per day. Perform with left hand(s). Perform using big movements.   Flipping Cards: Place deck of cards on the table. Flip cards over by opening your hand big to grasp and then turn your palm up big.  Deal cards: Hold 1/2 or whole deck in your hand. Use thumb to push card off top of deck with one big push.  Flip card between each finger.  Rotate ball with fingertips: Pick up with fingers/thumb and move as much as you can with each turn/movement (clockwise and counter-clockwise).  Pick up coins and place in coin bank or container: Pick up with big, intentional movements. Do not drag coin to the edge.  Pick up coins and stack one at a time: Pick up with big, intentional movements. Do not drag coin to the edge. (5-10 in a stack)  Pick up 5-10 coins one at a time and hold in palm. Then, move coins from palm to fingertips one at time and place in coin bank/container.  Pick up 5-10 coins one at a time and hold in palm. Then, move coins from palm to fingertips one at a time to stack.

## 2018-07-27 ENCOUNTER — Encounter (HOSPITAL_COMMUNITY): Payer: Self-pay | Admitting: Occupational Therapy

## 2018-07-27 ENCOUNTER — Ambulatory Visit (HOSPITAL_COMMUNITY): Payer: Medicare HMO | Admitting: Occupational Therapy

## 2018-07-27 ENCOUNTER — Other Ambulatory Visit: Payer: Self-pay

## 2018-07-27 DIAGNOSIS — M25632 Stiffness of left wrist, not elsewhere classified: Secondary | ICD-10-CM

## 2018-07-27 DIAGNOSIS — M25532 Pain in left wrist: Secondary | ICD-10-CM

## 2018-07-27 DIAGNOSIS — M549 Dorsalgia, unspecified: Secondary | ICD-10-CM | POA: Diagnosis not present

## 2018-07-27 DIAGNOSIS — R29898 Other symptoms and signs involving the musculoskeletal system: Secondary | ICD-10-CM

## 2018-07-27 DIAGNOSIS — R278 Other lack of coordination: Secondary | ICD-10-CM

## 2018-07-27 DIAGNOSIS — Z5321 Procedure and treatment not carried out due to patient leaving prior to being seen by health care provider: Secondary | ICD-10-CM | POA: Diagnosis not present

## 2018-07-27 NOTE — Therapy (Signed)
[place], [place], [address] Phone: [phone]   Fax:  [phone]  Occupational Therapy Treatment  Patient Details  Name: Emily Cooke MRN: [id number] Date of Birth: [birth year] Referring Provider (OT): Sanjuana Kava, MD   Encounter Date: 07/27/2018  OT End of Session - 07/27/18 1145    Visit Number  2    Number of Visits  8    Date for OT Re-Evaluation  08/22/18    Authorization Type  1) [employer] 2) medicaid    OT Start Time  1102    OT Stop Time  1143    OT Time Calculation (min)  41 min    Activity Tolerance  Patient tolerated treatment well    Behavior During Therapy  [facility] for tasks assessed/performed       Past Medical History:  Diagnosis Date  . Anxiety   . Arthritis   . GERD (gastroesophageal reflux disease)   . Hypertension   . Migraine   . Panic attack   . Panic disorder   . UTI (lower urinary tract infection)   . Vertigo     Past Surgical History:  Procedure Laterality Date  . ABDOMINAL HYSTERECTOMY     partial  . BREAST BIOPSY     benighn  . BREAST SURGERY      There were no vitals filed for this visit.  Subjective Assessment - 07/27/18 1059    Subjective   S: I've been wearing my glove.     Currently in Pain?  No/denies         [facility] OT Assessment - 07/27/18 1059      Assessment   Medical Diagnosis  Left wrist fracture      Precautions   Precautions  None               OT Treatments/Exercises (OP) - 07/27/18 1100      Exercises   Exercises  Wrist;Hand;Theraputty      Weighted Stretch Over Towel Roll   Wrist Flexion - Weighted Stretch  1 pound;60 seconds    Wrist Extension - Weighted Stretch  1 pound;60 seconds      Wrist Exercises   Wrist Flexion  PROM;10 reps;AROM;15 reps    Wrist Extension  PROM;10 reps;AROM;15 reps    Wrist Radial Deviation  PROM;5 reps;AROM;15 reps    Wrist Ulnar Deviation  PROM;5 reps;AROM;15 reps      Additional  Wrist Exercises   Sponges  11, 13    Hand Gripper with Large Beads  all beads with gripper at 7#    Hand Gripper with Medium Beads  all beads with gripper at 7#      Hand Exercises   MCPJ Flexion  AROM;10 reps    MCPJ Extension  AROM;10 reps      Theraputty   Theraputty - Roll  yellow    Theraputty - Grip  supinated and pronated-yellow    Theraputty - Pinch  lateral and 3 point-yellow      Manual Therapy   Manual Therapy  Myofascial release    Manual therapy comments  completed separately from therapeutic exercises    Myofascial Release  Myofascial release and manual techniques to left MCP joints to decrease fascial restrictions and edema and increase ROM               OT Short Term Goals - 07/27/18 1148      OT SHORT TERM GOAL #1  Title  Patient will be educated and independent with HEP in order to faciliate progress in the therapy and return to using her LUE as her non dominant when completing all daily tasks.     Time  4    Period  Weeks    Status  On-going    Target Date  08/22/18      OT SHORT TERM GOAL #2   Title  Patient will increase A/ROM for left wrist flexion/extension in addition to her hand (digits 1-5) to [facility] in order to increase performance during grooming and self care tasks while also being able to form a complete fist.     Time  4    Period  Weeks    Status  On-going      OT SHORT TERM GOAL #3   Title  Patient will increase left grip strength by 10# and pinch strength by 5# in order to open jars and containers using her Left hand.     Time  4    Period  Weeks    Status  On-going      OT SHORT TERM GOAL #4   Title  Patient will decrease pain level to 4/10 or less when completing her daily tasks with use of HEP and pain management techniques.     Time  4    Period  Weeks    Status  Achieved      OT SHORT TERM GOAL #5   Title  Patient will increase her left hand coordination by completing the 9 hole peg test in 20 seconds or less in order to  manipulate household items without dropping.     Time  4    Period  Weeks    Status  On-going      OT SHORT TERM GOAL #6   Title  Patient will increase her left wrist supination and pronation strength to 4+/5 in order to be able to pick up light weight household items with less difficulty.    Time  4    Period  Weeks    Status  On-going      OT SHORT TERM GOAL #7   Title  Patient will decrease any fascial restrictions to min amount or less in her left hand, wrist, and forearm in order to increase functional mobility needed to complete daily tasks.     Time  4    Period  Weeks    Status  Achieved               Plan - 07/27/18 1145    Clinical Impression Statement  A: Pt brought in flexion glove for adjustment, OT tightened all digits. Initiated manual techniques working to decrease restrictions and edema along MCP joints today. Passive stretching and A/ROM completed for wrist and hand ROM. Hand gripper task and theraputty completed for wrist and hand strengthening. Verbal cuing for form and technique.     Body Structure / Function / Physical Skills  ADL;Decreased knowledge of use of DME;Strength;Pain;UE functional use;ROM;IADL;Fascial restriction;Sensation;Mobility;Flexibility;Coordination;FMC;Decreased knowledge of precautions;GMC;Dexterity    Plan  P: Continue with passive stretching working to improve pt tolerance and ROM, attempt small beads with gripper, add pinch task       Patient will benefit from skilled therapeutic intervention in order to improve the following deficits and impairments:  Body Structure / Function / Physical Skills  Visit Diagnosis: Other symptoms and signs involving the musculoskeletal system  Pain in left wrist  Stiffness of left wrist,  not elsewhere classified  Other lack of coordination    Problem List There are no active problems to display for this patient.  Guadelupe Sabin, OTR/L  [phone] 07/27/2018, 11:48 AM  [place] [address] [place], [place], [address] Phone: [phone]   Fax:  [phone]  Name: Emily Cooke MRN: [id number] Date of Birth: December 08, 1950

## 2018-07-28 ENCOUNTER — Encounter (HOSPITAL_COMMUNITY): Payer: Self-pay | Admitting: Emergency Medicine

## 2018-07-28 ENCOUNTER — Other Ambulatory Visit: Payer: Self-pay

## 2018-07-28 ENCOUNTER — Emergency Department (HOSPITAL_COMMUNITY)
Admission: EM | Admit: 2018-07-28 | Discharge: 2018-07-28 | Disposition: A | Discharge status: Home / Self Care | Payer: Medicare HMO | Attending: Emergency Medicine | Admitting: Emergency Medicine

## 2018-07-28 NOTE — ED Notes (Signed)
Pt approached the Nurse Station and told this RN, "I'm just going to go home and take a muscle relaxer." This RN had previously discussed with the Pt the Physician would be right there to evaluate her, if she would just wait a little while longer. Pt insisted she was just going to go home and signed out AMA.

## 2018-07-28 NOTE — ED Triage Notes (Addendum)
Pt reports she is out of Hydrocodone.

## 2018-07-28 NOTE — ED Triage Notes (Signed)
Pt states she has had middle back pain since Wednesday.

## 2018-07-31 ENCOUNTER — Encounter (HOSPITAL_COMMUNITY): Payer: Self-pay | Admitting: Occupational Therapy

## 2018-07-31 ENCOUNTER — Other Ambulatory Visit: Payer: Self-pay

## 2018-07-31 ENCOUNTER — Ambulatory Visit (HOSPITAL_COMMUNITY): Payer: Medicare HMO | Admitting: Occupational Therapy

## 2018-07-31 DIAGNOSIS — M25632 Stiffness of left wrist, not elsewhere classified: Secondary | ICD-10-CM | POA: Diagnosis not present

## 2018-07-31 DIAGNOSIS — M25532 Pain in left wrist: Secondary | ICD-10-CM

## 2018-07-31 DIAGNOSIS — R29898 Other symptoms and signs involving the musculoskeletal system: Secondary | ICD-10-CM | POA: Diagnosis not present

## 2018-07-31 DIAGNOSIS — R278 Other lack of coordination: Secondary | ICD-10-CM

## 2018-07-31 NOTE — Therapy (Signed)
[place], [place], [address] Phone: [phone]   Fax:  [phone]  Occupational Therapy Treatment  Patient Details  Name: Emily Cooke MRN: [id number] Date of Birth: [birth year] Referring Provider (OT): Sanjuana Kava, MD   Encounter Date: 07/31/2018  OT End of Session - 07/31/18 1200    Visit Number  3    Number of Visits  8    Date for OT Re-Evaluation  08/22/18    Authorization Type  1) [employer] medic[employer] 2) medicaid    OT Start Time  1116    OT Stop Time  1157    OT Time Calculation (min)  41 min    Activity Tolerance  Patient tolerated treatment well    Behavior During Therapy  [facility] for tasks assessed/performed       Past Medical History:  Diagnosis Date  . Anxiety   . Arthritis   . GERD (gastroesophageal reflux disease)   . Hypertension   . Migraine   . Panic attack   . Panic disorder   . UTI (lower urinary tract infection)   . Vertigo     Past Surgical History:  Procedure Laterality Date  . ABDOMINAL HYSTERECTOMY     partial  . BREAST BIOPSY     benighn  . BREAST SURGERY      There were no vitals filed for this visit.  Subjective Assessment - 07/31/18 1115    Subjective   S: I think the swelling has gone down some.     Currently in Pain?  No/denies         [facility] OT Assessment - 07/31/18 1115      Assessment   Medical Diagnosis  Left wrist fracture      Precautions   Precautions  None               OT Treatments/Exercises (OP) - 07/31/18 1119      Exercises   Exercises  Wrist;Hand;Theraputty      Weighted Stretch Over Towel Roll   Wrist Flexion - Weighted Stretch  1 pound;60 seconds    Wrist Extension - Weighted Stretch  1 pound;60 seconds      Wrist Exercises   Wrist Flexion  PROM;10 reps;AROM;15 reps    Wrist Extension  PROM;10 reps;AROM;15 reps    Wrist Radial Deviation  PROM;5 reps;AROM;15 reps    Wrist Ulnar Deviation  PROM;5 reps;AROM;15 reps      Additional Wrist Exercises   Sponges  11, 21    Hand Gripper with Large Beads  all beads with gripper at 11#   gripper horizontal   Hand Gripper with Medium Beads  all beads with gripper at 11#   gripper horizontal     Hand Exercises   MCPJ Flexion  AROM;15 reps    MCPJ Extension  AROM;15 reps    Thumb Opposition  to 5th digit mcp, 10X    Other Hand Exercises  tendon glides, 10X A/ROM    Other Hand Exercises  Pt used red clothespin and 3 point pinch to grasp and place 20 high resistance sponges into bucket. Increased time, min/mod difficulty      Manual Therapy   Manual Therapy  Myofascial release    Manual therapy comments  completed separately from therapeutic exercises    Myofascial Release  Myofascial release and manual techniques to left MCP joints to decrease fascial restrictions and edema and increase ROM  OT Short Term Goals - 07/27/18 1148      OT SHORT TERM GOAL #1   Title  Patient will be educated and independent with HEP in order to faciliate progress in the therapy and return to using her LUE as her non dominant when completing all daily tasks.     Time  4    Period  Weeks    Status  On-going    Target Date  08/22/18      OT SHORT TERM GOAL #2   Title  Patient will increase A/ROM for left wrist flexion/extension in addition to her hand (digits 1-5) to [facility] in order to increase performance during grooming and self care tasks while also being able to form a complete fist.     Time  4    Period  Weeks    Status  On-going      OT SHORT TERM GOAL #3   Title  Patient will increase left grip strength by 10# and pinch strength by 5# in order to open jars and containers using her Left hand.     Time  4    Period  Weeks    Status  On-going      OT SHORT TERM GOAL #4   Title  Patient will decrease pain level to 4/10 or less when completing her daily tasks with use of HEP and pain management techniques.     Time  4    Period  Weeks    Status   Achieved      OT SHORT TERM GOAL #5   Title  Patient will increase her left hand coordination by completing the 9 hole peg test in 20 seconds or less in order to manipulate household items without dropping.     Time  4    Period  Weeks    Status  On-going      OT SHORT TERM GOAL #6   Title  Patient will increase her left wrist supination and pronation strength to 4+/5 in order to be able to pick up light weight household items with less difficulty.    Time  4    Period  Weeks    Status  On-going      OT SHORT TERM GOAL #7   Title  Patient will decrease any fascial restrictions to min amount or less in her left hand, wrist, and forearm in order to increase functional mobility needed to complete daily tasks.     Time  4    Period  Weeks    Status  Achieved               Plan - 07/31/18 1201    Clinical Impression Statement  A: Pt reports she is continuing to wear flexion glove and will bring to her next appt for adjustment if needed. Continue with passive stretching, pt with slightly increased stiffness today however improved by end of session. Increased gripper resistance to 11#, added pinch task. Verbal cuing for form and technique.     Body Structure / Function / Physical Skills  ADL;Decreased knowledge of use of DME;Strength;Pain;UE functional use;ROM;IADL;Fascial restriction;Sensation;Mobility;Flexibility;Coordination;FMC;Decreased knowledge of precautions;GMC;Dexterity    Plan  P: Continue with passive stretching working to improve pt tolerance and ROM, attempt small beads with gripper       Patient will benefit from skilled therapeutic intervention in order to improve the following deficits and impairments:  Body Structure / Function / Physical Skills  Visit Diagnosis: Other symptoms and  signs involving the musculoskeletal system  Pain in left wrist  Stiffness of left wrist, not elsewhere classified  Other lack of coordination    Problem List There are no  active problems to display for this patient.  Guadelupe Sabin, OTR/L  [phone] 07/31/2018, 12:04 PM  [place] [address] [place], [place], [address] Phone: [phone]   Fax:  [phone]  Name: Emily Cooke MRN: [id number] Date of Birth: January 06, 1951

## 2018-08-08 ENCOUNTER — Telehealth (HOSPITAL_COMMUNITY): Payer: Self-pay | Admitting: Internal Medicine

## 2018-08-08 ENCOUNTER — Ambulatory Visit (HOSPITAL_COMMUNITY): Payer: Medicare HMO | Admitting: Occupational Therapy

## 2018-08-08 NOTE — Telephone Encounter (Signed)
08/08/18  spoke with patient and cx today due to therapist out with emergency

## 2018-08-10 ENCOUNTER — Ambulatory Visit (HOSPITAL_COMMUNITY): Payer: Medicare HMO | Attending: Orthopaedic Surgery

## 2018-08-10 ENCOUNTER — Other Ambulatory Visit: Payer: Self-pay

## 2018-08-10 DIAGNOSIS — M25632 Stiffness of left wrist, not elsewhere classified: Secondary | ICD-10-CM | POA: Diagnosis not present

## 2018-08-10 DIAGNOSIS — R29898 Other symptoms and signs involving the musculoskeletal system: Secondary | ICD-10-CM | POA: Insufficient documentation

## 2018-08-10 DIAGNOSIS — M25532 Pain in left wrist: Secondary | ICD-10-CM | POA: Diagnosis not present

## 2018-08-10 DIAGNOSIS — R278 Other lack of coordination: Secondary | ICD-10-CM | POA: Diagnosis not present

## 2018-08-10 NOTE — Therapy (Signed)
[place], [place], [address] Phone: [phone]   Fax:  [phone]  Occupational Therapy Treatment  Patient Details  Name: Emily Cooke MRN: [id number] Date of Birth: 01-28-1951 Referring Provider (OT): Sanjuana Kava, MD   Encounter Date: 08/10/2018  OT End of Session - 08/10/18 1114    Visit Number  4    Number of Visits  8    Date for OT Re-Evaluation  08/22/18    Authorization Type  1) [employer] 2) medicaid    OT Start Time  1015    OT Stop Time  1100    OT Time Calculation (min)  45 min    Activity Tolerance  Patient tolerated treatment well    Behavior During Therapy  [facility] for tasks assessed/performed       Past Medical History:  Diagnosis Date  . Anxiety   . Arthritis   . GERD (gastroesophageal reflux disease)   . Hypertension   . Migraine   . Panic attack   . Panic disorder   . UTI (lower urinary tract infection)   . Vertigo     Past Surgical History:  Procedure Laterality Date  . ABDOMINAL HYSTERECTOMY     partial  . BREAST BIOPSY     benighn  . BREAST SURGERY      There were no vitals filed for this visit.  Subjective Assessment - 08/10/18 1114    Subjective   S: I might need the rubberbands tightened some.     Currently in Pain?  Yes    Pain Score  8     Pain Location  Hand    Pain Orientation  Left    Pain Descriptors / Indicators  Sore    Pain Type  Acute pain    Pain Radiating Towards  N/A    Pain Onset  Today    Pain Frequency  Constant    Aggravating Factors   Wearing the flexion glove today    Pain Relieving Factors  unknown    Effect of Pain on Daily Activities  Min effect    Multiple Pain Sites  No                   OT Treatments/Exercises (OP) - 08/10/18 1044      Exercises   Exercises  Wrist;Hand;Theraputty      Additional Wrist Exercises   Sponges  20,25    Hand Gripper with Large Beads  all beads with gripper at 11#   horizontal   Hand Gripper with Medium Beads  all beads with gripper at 11#   horizontal   Hand Gripper with Small Beads  all beads with gripper set at 11   horizontal     Hand Exercises   MCPJ Flexion  AROM;15 reps;PROM;10 reps    MCPJ Extension  AROM;15 reps;PROM;10 reps    Other Hand Exercises  Pt used red clothespin and 3 point pinch to grasp and place 25 high resistance sponges into bucket. Increased time, min/mod difficulty      Manual Therapy   Manual Therapy  Myofascial release    Manual therapy comments  completed separately from therapeutic exercises    Myofascial Release  Myofascial release and manual techniques to left MCP joints to decrease fascial restrictions and edema and increase ROM      Fine Motor Coordination (Hand/Wrist)   Fine Motor Coordination  Grooved pegs;Flipping cards;Dealing card with thumb    Flipping  cards  Pt was able to flip cards one at a time with no difficulty    Dealing card with thumb  patient dealt cards using her thumb with min-mod difficulty while being unable to push one card only intermittently    Grooved pegs  patient completed grooved pegboard picking up one peg at a time to place with min difficulty. Patien then removed pegs attempting to pick up and hold 2 pegs in the palm of her hand without dropping focusing on in hand manipulation. pt completed with mod difficulty.                OT Short Term Goals - 08/10/18 1115      OT SHORT TERM GOAL #1   Title  Patient will be educated and independent with HEP in order to faciliate progress in the therapy and return to using her LUE as her non dominant when completing all daily tasks.     Time  4    Period  Weeks    Status  On-going    Target Date  08/22/18      OT SHORT TERM GOAL #2   Title  Patient will increase A/ROM for left wrist flexion/extension in addition to her hand (digits 1-5) to [facility] in order to increase performance during grooming and self care tasks while also being able to form a  complete fist.     Time  4    Period  Weeks    Status  On-going      OT SHORT TERM GOAL #3   Title  Patient will increase left grip strength by 10# and pinch strength by 5# in order to open jars and containers using her Left hand.     Time  4    Period  Weeks    Status  On-going      OT SHORT TERM GOAL #4   Title  Patient will decrease pain level to 4/10 or less when completing her daily tasks with use of HEP and pain management techniques.     Time  4    Period  Weeks      OT SHORT TERM GOAL #5   Title  Patient will increase her left hand coordination by completing the 9 hole peg test in 20 seconds or less in order to manipulate household items without dropping.     Time  4    Period  Weeks    Status  On-going      OT SHORT TERM GOAL #6   Title  Patient will increase her left wrist supination and pronation strength to 4+/5 in order to be able to pick up light weight household items with less difficulty.    Time  4    Period  Weeks    Status  On-going      OT SHORT TERM GOAL #7   Title  Patient will decrease any fascial restrictions to min amount or less in her left hand, wrist, and forearm in order to increase functional mobility needed to complete daily tasks.     Time  4    Period  Weeks               Plan - 08/10/18 1204    Clinical Impression Statement  A: flexion glove rubberbands adjusted for middle and pointer finger to increase flexion pull. Pt provided with coban wrap as well to wrap around DIP joints to increase amount of stretch desired. Patient was able to complete  handgripper task with small beads at 11# resistance. VC for form and technique as needed. Continues to have swelling and joint stiffness in MCP joints, DIP and PIP joints although making progress towards goals.    Body Structure / Function / Physical Skills  ADL;Decreased knowledge of use of DME;Strength;Pain;UE functional use;ROM;IADL;Fascial  restriction;Sensation;Mobility;Flexibility;Coordination;FMC;Decreased knowledge of precautions;GMC;Dexterity    Plan  P: Increase handgripper resistance if able. Continue to work on increasing hand ROM to form a full fist.     Consulted and Agree with Plan of Care  Patient       Patient will benefit from skilled therapeutic intervention in order to improve the following deficits and impairments:  Body Structure / Function / Physical Skills  Visit Diagnosis: Other symptoms and signs involving the musculoskeletal system  Pain in left wrist  Stiffness of left wrist, not elsewhere classified  Other lack of coordination    Problem List There are no active problems to display for this patient.  Ailene Ravel, OTR/L,CBIS  [phone]  08/10/2018, 12:08 PM  [place] [address] [place], [place], [address] Phone: [phone]   Fax:  [phone]  Name: LATORRIA ZEOLI MRN: [id number] Date of Birth: 05-15-1950

## 2018-08-13 DIAGNOSIS — M79671 Pain in right foot: Secondary | ICD-10-CM | POA: Diagnosis not present

## 2018-08-13 DIAGNOSIS — H9391 Unspecified disorder of right ear: Secondary | ICD-10-CM | POA: Diagnosis not present

## 2018-08-13 DIAGNOSIS — K219 Gastro-esophageal reflux disease without esophagitis: Secondary | ICD-10-CM | POA: Diagnosis not present

## 2018-08-13 DIAGNOSIS — I10 Essential (primary) hypertension: Secondary | ICD-10-CM | POA: Diagnosis not present

## 2018-08-15 ENCOUNTER — Other Ambulatory Visit: Payer: Self-pay

## 2018-08-15 ENCOUNTER — Ambulatory Visit (HOSPITAL_COMMUNITY): Payer: Medicare HMO | Admitting: Occupational Therapy

## 2018-08-15 ENCOUNTER — Encounter (HOSPITAL_COMMUNITY): Payer: Self-pay | Admitting: Occupational Therapy

## 2018-08-15 DIAGNOSIS — R278 Other lack of coordination: Secondary | ICD-10-CM

## 2018-08-15 DIAGNOSIS — M25532 Pain in left wrist: Secondary | ICD-10-CM

## 2018-08-15 DIAGNOSIS — M25632 Stiffness of left wrist, not elsewhere classified: Secondary | ICD-10-CM | POA: Diagnosis not present

## 2018-08-15 DIAGNOSIS — R29898 Other symptoms and signs involving the musculoskeletal system: Secondary | ICD-10-CM | POA: Diagnosis not present

## 2018-08-15 NOTE — Therapy (Signed)
[facility] [place], [place], [address] Phone: [phone]   Fax:  [phone]  Occupational Therapy Treatment  Patient Details  Name: Emily Cooke MRN: [id number] Date of Birth: October 23, 1950 Referring Provider (OT): Sanjuana Kava, MD   Encounter Date: 08/15/2018  OT End of Session - 08/15/18 1044    Visit Number  5    Number of Visits  8    Date for OT Re-Evaluation  08/22/18    Authorization Type  1) [employer] medic[employer] 2) medicaid    OT Start Time  1007    OT Stop Time  1046    OT Time Calculation (min)  39 min    Activity Tolerance  Patient tolerated treatment well    Behavior During Therapy  [facility] for tasks assessed/performed       Past Medical History:  Diagnosis Date  . Anxiety   . Arthritis   . GERD (gastroesophageal reflux disease)   . Hypertension   . Migraine   . Panic attack   . Panic disorder   . UTI (lower urinary tract infection)   . Vertigo     Past Surgical History:  Procedure Laterality Date  . ABDOMINAL HYSTERECTOMY     partial  . BREAST BIOPSY     [facility]  . BREAST SURGERY      There were no vitals filed for this visit.  Subjective Assessment - 08/15/18 1005    Subjective   S: It's a little sore today.     Currently in Pain?  Yes    Pain Score  8     Pain Location  Hand    Pain Orientation  Left    Pain Descriptors / Indicators  Sore    Pain Type  Acute pain    Pain Radiating Towards  N/A    Pain Onset  Today    Pain Frequency  Intermittent    Aggravating Factors   wearing the flexion glove    Pain Relieving Factors  unknown    Effect of Pain on Daily Activities  min effect         [facility] OT Assessment - 08/15/18 1005      Assessment   Medical Diagnosis  Left wrist fracture      Precautions   Precautions  None               OT Treatments/Exercises (OP) - 08/15/18 1009      Exercises   Exercises  Wrist;Hand;Theraputty      Additional Wrist Exercises    Sponges  25, 25    Hand Gripper with Large Beads  all beads with gripper at 15#   horizontal    Hand Gripper with Medium Beads  all beads with gripper at 15#   horizontal   Hand Gripper with Small Beads  all beads gripper at 15#   horizontal     Hand Exercises   MCPJ Flexion  AROM;15 reps;PROM;10 reps    MCPJ Extension  AROM;15 reps;PROM;10 reps    Other Hand Exercises  tendon glides, 10X A/ROM    Other Hand Exercises  pt used pvc pipe to cut circles into red theraputty working on grip and wrist flexion/extension. Occasional rest breaks       Theraputty   Theraputty - Flatten  red      Manual Therapy   Manual Therapy  Myofascial release    Manual therapy comments  completed separately from therapeutic exercises  Myofascial Release  Myofascial release and manual techniques to left MCP joints to decrease fascial restrictions and edema and increase ROM      Fine Motor Coordination (Hand/Wrist)   Fine Motor Coordination  Manipulating coins    Manipulating coins  Pt holding 5 coins in palm and working on palm to fingertip translation to place in slotted container. Max difficulty using digits to pull coins to fingertips, used thumb to push towards fingertips.                OT Short Term Goals - 08/10/18 1115      OT SHORT TERM GOAL #1   Title  Patient will be educated and independent with HEP in order to faciliate progress in the therapy and return to using her LUE as her non dominant when completing all daily tasks.     Time  4    Period  Weeks    Status  On-going    Target Date  08/22/18      OT SHORT TERM GOAL #2   Title  Patient will increase A/ROM for left wrist flexion/extension in addition to her hand (digits 1-5) to [facility] in order to increase performance during grooming and self care tasks while also being able to form a complete fist.     Time  4    Period  Weeks    Status  On-going      OT SHORT TERM GOAL #3   Title  Patient will increase left grip strength by  10# and pinch strength by 5# in order to open jars and containers using her Left hand.     Time  4    Period  Weeks    Status  On-going      OT SHORT TERM GOAL #4   Title  Patient will decrease pain level to 4/10 or less when completing her daily tasks with use of HEP and pain management techniques.     Time  4    Period  Weeks      OT SHORT TERM GOAL #5   Title  Patient will increase her left hand coordination by completing the 9 hole peg test in 20 seconds or less in order to manipulate household items without dropping.     Time  4    Period  Weeks    Status  On-going      OT SHORT TERM GOAL #6   Title  Patient will increase her left wrist supination and pronation strength to 4+/5 in order to be able to pick up light weight household items with less difficulty.    Time  4    Period  Weeks    Status  On-going      OT SHORT TERM GOAL #7   Title  Patient will decrease any fascial restrictions to min amount or less in her left hand, wrist, and forearm in order to increase functional mobility needed to complete daily tasks.     Time  4    Period  Weeks               Plan - 08/15/18 1042    Clinical Impression Statement  A: Pt reports she is completing more tasks at home without thinking about it. Increased gripper resistance today and added red theraputty activity. Pt is able to make a flat fist during passive stretching and tendon glides. Also added coin manipulation task, pt with more difficulty if coins were in palm of hand  or towards heel of hand. Verbal cuing for form and technique during exercises.     Body Structure / Function / Physical Skills  ADL;Decreased knowledge of use of DME;Strength;Pain;UE functional use;ROM;IADL;Fascial restriction;Sensation;Mobility;Flexibility;Coordination;FMC;Decreased knowledge of precautions;GMC;Dexterity    Plan  P: Reassess and recert, make additional appts as needed or discharge with HEP depending on pt's transportation availability        Patient will benefit from skilled therapeutic intervention in order to improve the following deficits and impairments:  Body Structure / Function / Physical Skills  Visit Diagnosis: Other symptoms and signs involving the musculoskeletal system  Pain in left wrist  Stiffness of left wrist, not elsewhere classified  Other lack of coordination    Problem List There are no active problems to display for this patient.  Guadelupe Sabin, OTR/L  [phone] 08/15/2018, 10:50 AM  [facility] [address] [place], [place], [address] Phone: [phone]   Fax:  [phone]  Name: Emily Cooke MRN: [id number] Date of Birth: [birth year]

## 2018-08-17 ENCOUNTER — Encounter (HOSPITAL_COMMUNITY): Payer: Self-pay

## 2018-08-17 ENCOUNTER — Other Ambulatory Visit: Payer: Self-pay

## 2018-08-17 ENCOUNTER — Ambulatory Visit (HOSPITAL_COMMUNITY): Payer: Medicare HMO

## 2018-08-17 DIAGNOSIS — M25532 Pain in left wrist: Secondary | ICD-10-CM

## 2018-08-17 DIAGNOSIS — R29898 Other symptoms and signs involving the musculoskeletal system: Secondary | ICD-10-CM

## 2018-08-17 DIAGNOSIS — M25632 Stiffness of left wrist, not elsewhere classified: Secondary | ICD-10-CM | POA: Diagnosis not present

## 2018-08-17 DIAGNOSIS — R278 Other lack of coordination: Secondary | ICD-10-CM | POA: Diagnosis not present

## 2018-08-17 NOTE — Therapy (Signed)
[facility] [address] [place], [place], [address] Phone: [phone]   Fax:  [phone]  Occupational Therapy Treatment Reassessment/discharge Patient Details  Name: Emily Cooke MRN: [id number] Date of Birth: [birth year] Referring Provider (OT): Sanjuana Kava, MD   Encounter Date: 08/17/2018  OT End of Session - 08/17/18 1142    Visit Number  6    Number of Visits  8    Authorization Type  1) [employer] 2) medicaid    OT Start Time  1017   reassess and discharge   OT Stop Time  1100    OT Time Calculation (min)  43 min    Activity Tolerance  Patient tolerated treatment well    Behavior During Therapy  [facility] for tasks assessed/performed       Past Medical History:  Diagnosis Date  . Anxiety   . Arthritis   . GERD (gastroesophageal reflux disease)   . Hypertension   . Migraine   . Panic attack   . Panic disorder   . UTI (lower urinary tract infection)   . Vertigo     Past Surgical History:  Procedure Laterality Date  . ABDOMINAL HYSTERECTOMY     partial  . BREAST BIOPSY     [facility]  . BREAST SURGERY      There were no vitals filed for this visit.  Subjective Assessment - 08/17/18 1138    Subjective   S: I can lift a pan but I can't hold it long.    Currently in Pain?  Yes    Pain Score  8     Pain Location  Hand    Pain Orientation  Left    Pain Descriptors / Indicators  Sore    Pain Type  Acute pain         [facility] OT Assessment - 08/17/18 1033      Assessment   Medical Diagnosis  Left wrist fracture      Precautions   Precautions  None      Coordination   Left 9 Hole Peg Test  22.3"   previous: 25.8"     ROM / Strength   AROM / PROM / Strength  AROM;PROM;Strength      AROM   Overall AROM Comments  Assessed seated.     Right/Left Forearm  Left    Left Forearm Supination  90 Degrees   previous: 88   Right/Left Wrist  Left    Left Wrist Extension  64 Degrees   previous: 54   Left  Wrist Flexion  66 Degrees   previous: 64   Left Wrist Radial Deviation  20 Degrees   previous: 18   Left Wrist Ulnar Deviation  30 Degrees   previous: 34     PROM   Left Wrist Extension  90 Degrees   previous: 64   Left Wrist Flexion  72 Degrees   previous: 72   Left Wrist Radial Deviation  22 Degrees   previous: 20   Left Wrist Ulnar Deviation  40 Degrees   previous: same     Strength   Overall Strength Comments  Assessed seated. Wrist is 5/5 strength     Right/Left Forearm  Left    Left Forearm Pronation  5/5   previous: 3+/5   Left Forearm Supination  5/5   previous: 4+/5   Right/Left Wrist  Left    Left Hand Grip (lbs)  10   previous: 5   Left  Hand Lateral Pinch  8 lbs   previous: 3   Left Hand 3 Point Pinch  3 lbs   previous: 3              OT Treatments/Exercises (OP) - 08/17/18 1139      Exercises   Exercises  Wrist;Hand;Elbow      Elbow Exercises   Forearm Supination  PROM;5 reps      Wrist Exercises   Wrist Flexion  PROM;5 reps    Wrist Extension  PROM;5 reps    Wrist Radial Deviation  PROM;5 reps    Wrist Ulnar Deviation  PROM;5 reps      Hand Exercises   MCPJ Flexion  PROM;10 reps    MCPJ Extension  PROM;10 reps      Manual Therapy   Manual Therapy  Myofascial release    Manual therapy comments  completed separately from therapeutic exercises    Myofascial Release  Myofascial release and manual techniques to left MCP joints to decrease fascial restrictions and edema and increase ROM             OT Education - 08/17/18 1140    Education Details  Revieed progress in therapy, reviewed goals, reviewed HEP. Pt is to continue with yellow theraputty for grip and pinch. Provided red to progress when ready. Continue with flexion glove to increase digit ROM, May stop coordination exercises. Added wrist strengthening and wrist stretches.    Person(s) Educated  Patient    Methods  Explanation;Demonstration;Verbal cues;Handout     Comprehension  Verbalized understanding       OT Short Term Goals - 08/17/18 1044      OT SHORT TERM GOAL #1   Title  Patient will be educated and independent with HEP in order to faciliate progress in the therapy and return to using her LUE as her non dominant when completing all daily tasks.     Time  4    Period  Weeks    Status  Achieved    Target Date  08/22/18      OT SHORT TERM GOAL #2   Title  Patient will increase A/ROM for left wrist flexion/extension in addition to her hand (digits 1-5) to [facility] in order to increase performance during grooming and self care tasks while also being able to form a complete fist.     Time  4    Period  Weeks    Status  Partially Met      OT SHORT TERM GOAL #3   Title  Patient will increase left grip strength by 10# and pinch strength by 5# in order to open jars and containers using her Left hand.     Time  4    Period  Weeks    Status  Partially Met      OT SHORT TERM GOAL #4   Title  Patient will decrease pain level to 4/10 or less when completing her daily tasks with use of HEP and pain management techniques.     Time  4    Period  Weeks    Status  Not Met      OT SHORT TERM GOAL #5   Title  Patient will increase her left hand coordination by completing the 9 hole peg test in 20 seconds or less in order to manipulate household items without dropping.     Time  4    Period  Weeks    Status  Achieved  OT SHORT TERM GOAL #6   Title  Patient will increase her left wrist supination and pronation strength to 4+/5 in order to be able to pick up light weight household items with less difficulty.    Time  4    Period  Weeks    Status  Achieved      OT SHORT TERM GOAL #7   Title  Patient will decrease any fascial restrictions to min amount or less in her left hand, wrist, and forearm in order to increase functional mobility needed to complete daily tasks.     Time  4    Period  Weeks    Status  Achieved               Plan -  08/17/18 1146    Clinical Impression Statement  A: Reassessment completed this date. patient reports that she will not have transportation for her therapy appointments and will need to discharge to a HEP. At this time, patient has met 4/7 STGs with 2 partially met. Pt reports that she is trying to use her left hand as much as she can. She is beginning to use it without thinking about it. She continues to have deficits with ROM and strength. She is able to make almost a closed fist. HEP was updated and patient is able to complete independently.    Body Structure / Function / Physical Skills  ADL;Decreased knowledge of use of DME;Strength;Pain;UE functional use;ROM;IADL;Fascial restriction;Sensation;Mobility;Flexibility;Coordination;FMC;Decreased knowledge of precautions;GMC;Dexterity    Plan  P: Discharge from OT services with HEP.    Consulted and Agree with Plan of Care  Patient       Patient will benefit from skilled therapeutic intervention in order to improve the following deficits and impairments:  Body Structure / Function / Physical Skills  Visit Diagnosis: Other symptoms and signs involving the musculoskeletal system  Pain in left wrist  Stiffness of left wrist, not elsewhere classified  Other lack of coordination    Problem List There are no active problems to display for this patient.  OCCUPATIONAL THERAPY DISCHARGE SUMMARY  Visits from Start of Care: 6  Current functional level related to goals / functional outcomes: See above   Remaining deficits: See above   Education / Equipment: See above Plan: Patient agrees to discharge.  Patient goals were partially met. Patient is being discharged due to the patient's request.  ?????due to lack of transportation.          Ailene Ravel, OTR/L,CBIS  [phone]  08/17/2018, 12:00 PM  [facility] [address] [place], [place], [address] Phone: [phone]   Fax:   [phone]  Name: JOELLE ROSWELL MRN: [id number] Date of Birth: [birth year]

## 2018-08-17 NOTE — Patient Instructions (Signed)
Completed these exercises 1-2 times a day.   WRIST CURLS - FREE WEIGHT - THIGH  While holding a small dumbbell and resting your forearm on your thigh, bend your wrist up and down with your palm face up as shown. Completed 10-12 repetitions. 1 set    WRIST EXTENSION CURLS - FREE WEIGHT - THIGH  While holding a small dumbbell, place your forearm on your thigh and bend your wrist up and down with your palm face down as shown. Completed 10-12 repetitions. 1 set       Wrist flexor stretch - weight  With your wrist hanging off the edge of the table, place 2-3 # weight in your hand, hold for a stretch x 30-60 seconds.       Wrist extensor stretch - weight  Rest wrist at edge of table. Place a 2-3 # weight in hand. Hold for a stretch. You can apply overpressure as tolerated.    PRAYER STRETCH - WRIST  Place the palms of your hands together to stretch the wrist as shown. Hold for 30-60 seconds. 1 set   WRIST EXTENSOR STRETCH  Use your unaffected hand to bend the affected wrist down as shown.   Keep the elbow straight on the affected side the entire time.  Hold for 30-60 seconds. 1 set

## 2018-08-22 ENCOUNTER — Encounter: Payer: Medicare HMO | Admitting: Podiatry

## 2018-08-28 ENCOUNTER — Emergency Department (HOSPITAL_COMMUNITY)
Admission: EM | Admit: 2018-08-28 | Discharge: 2018-08-28 | Disposition: A | Discharge status: Home / Self Care | Payer: Medicare HMO | Attending: Emergency Medicine | Admitting: Emergency Medicine

## 2018-08-28 ENCOUNTER — Other Ambulatory Visit: Payer: Self-pay

## 2018-08-28 ENCOUNTER — Other Ambulatory Visit (HOSPITAL_COMMUNITY): Payer: Self-pay | Admitting: Internal Medicine

## 2018-08-28 ENCOUNTER — Encounter (HOSPITAL_COMMUNITY): Payer: Self-pay | Admitting: Emergency Medicine

## 2018-08-28 DIAGNOSIS — H9209 Otalgia, unspecified ear: Secondary | ICD-10-CM | POA: Diagnosis not present

## 2018-08-28 DIAGNOSIS — Z5321 Procedure and treatment not carried out due to patient leaving prior to being seen by health care provider: Secondary | ICD-10-CM | POA: Diagnosis not present

## 2018-08-28 DIAGNOSIS — Z1231 Encounter for screening mammogram for malignant neoplasm of breast: Secondary | ICD-10-CM

## 2018-08-28 NOTE — ED Notes (Signed)
Pt called to be placed into room. Pt not in lobby.

## 2018-08-28 NOTE — ED Provider Notes (Signed)
Patient no longer in waiting room when name was called.    Lorayne Bender, PA-C 08/28/18 Doroteo Bradford, MD 08/28/18 2240

## 2018-08-28 NOTE — ED Triage Notes (Signed)
Patient complaining of left ear pain x 2 days.

## 2018-08-29 DIAGNOSIS — H6093 Unspecified otitis externa, bilateral: Secondary | ICD-10-CM | POA: Diagnosis not present

## 2018-09-01 DIAGNOSIS — H52223 Regular astigmatism, bilateral: Secondary | ICD-10-CM | POA: Diagnosis not present

## 2018-09-01 DIAGNOSIS — Z0100 Encounter for examination of eyes and vision without abnormal findings: Secondary | ICD-10-CM | POA: Diagnosis not present

## 2018-09-03 NOTE — Progress Notes (Signed)
This encounter was created in error - please disregard.

## 2018-09-04 ENCOUNTER — Other Ambulatory Visit (HOSPITAL_COMMUNITY): Payer: Medicare HMO

## 2018-09-10 ENCOUNTER — Other Ambulatory Visit: Payer: Self-pay

## 2018-09-10 ENCOUNTER — Ambulatory Visit (HOSPITAL_COMMUNITY)
Admission: RE | Admit: 2018-09-10 | Discharge: 2018-09-10 | Disposition: A | Discharge status: Home / Self Care | Payer: Medicare HMO | Source: Ambulatory Visit | Attending: Internal Medicine | Admitting: Internal Medicine

## 2018-09-10 DIAGNOSIS — Z1231 Encounter for screening mammogram for malignant neoplasm of breast: Secondary | ICD-10-CM

## 2018-09-10 DIAGNOSIS — Z13820 Encounter for screening for osteoporosis: Secondary | ICD-10-CM | POA: Insufficient documentation

## 2018-09-10 DIAGNOSIS — Z780 Asymptomatic menopausal state: Secondary | ICD-10-CM | POA: Diagnosis not present

## 2018-09-10 DIAGNOSIS — M8589 Other specified disorders of bone density and structure, multiple sites: Secondary | ICD-10-CM | POA: Diagnosis not present

## 2018-09-12 DIAGNOSIS — I639 Cerebral infarction, unspecified: Secondary | ICD-10-CM | POA: Diagnosis not present

## 2018-09-12 DIAGNOSIS — I10 Essential (primary) hypertension: Secondary | ICD-10-CM | POA: Diagnosis not present

## 2018-09-20 ENCOUNTER — Ambulatory Visit (INDEPENDENT_AMBULATORY_CARE_PROVIDER_SITE_OTHER): Payer: Medicare HMO | Admitting: Otolaryngology

## 2018-09-20 DIAGNOSIS — H903 Sensorineural hearing loss, bilateral: Secondary | ICD-10-CM

## 2018-10-13 DIAGNOSIS — I10 Essential (primary) hypertension: Secondary | ICD-10-CM | POA: Diagnosis not present

## 2018-10-13 DIAGNOSIS — K219 Gastro-esophageal reflux disease without esophagitis: Secondary | ICD-10-CM | POA: Diagnosis not present

## 2018-11-14 DIAGNOSIS — I10 Essential (primary) hypertension: Secondary | ICD-10-CM | POA: Diagnosis not present

## 2018-11-14 DIAGNOSIS — K219 Gastro-esophageal reflux disease without esophagitis: Secondary | ICD-10-CM | POA: Diagnosis not present

## 2018-11-28 DIAGNOSIS — K219 Gastro-esophageal reflux disease without esophagitis: Secondary | ICD-10-CM | POA: Diagnosis not present

## 2018-11-28 DIAGNOSIS — I10 Essential (primary) hypertension: Secondary | ICD-10-CM | POA: Diagnosis not present

## 2018-11-28 DIAGNOSIS — Z1331 Encounter for screening for depression: Secondary | ICD-10-CM | POA: Diagnosis not present

## 2018-11-28 DIAGNOSIS — Z1389 Encounter for screening for other disorder: Secondary | ICD-10-CM | POA: Diagnosis not present

## 2018-11-28 DIAGNOSIS — Z0001 Encounter for general adult medical examination with abnormal findings: Secondary | ICD-10-CM | POA: Diagnosis not present

## 2018-11-28 DIAGNOSIS — Z6834 Body mass index (BMI) 34.0-34.9, adult: Secondary | ICD-10-CM | POA: Diagnosis not present

## 2018-11-29 DIAGNOSIS — I10 Essential (primary) hypertension: Secondary | ICD-10-CM | POA: Diagnosis not present

## 2018-11-29 DIAGNOSIS — E669 Obesity, unspecified: Secondary | ICD-10-CM | POA: Diagnosis not present

## 2018-11-29 DIAGNOSIS — Z0001 Encounter for general adult medical examination with abnormal findings: Secondary | ICD-10-CM | POA: Diagnosis not present

## 2018-12-28 DIAGNOSIS — M549 Dorsalgia, unspecified: Secondary | ICD-10-CM | POA: Diagnosis not present

## 2018-12-28 DIAGNOSIS — K219 Gastro-esophageal reflux disease without esophagitis: Secondary | ICD-10-CM | POA: Diagnosis not present

## 2019-01-28 DIAGNOSIS — K219 Gastro-esophageal reflux disease without esophagitis: Secondary | ICD-10-CM | POA: Diagnosis not present

## 2019-01-28 DIAGNOSIS — I10 Essential (primary) hypertension: Secondary | ICD-10-CM | POA: Diagnosis not present

## 2019-01-30 ENCOUNTER — Emergency Department (HOSPITAL_COMMUNITY): Payer: Medicare HMO

## 2019-01-30 ENCOUNTER — Emergency Department (HOSPITAL_COMMUNITY)
Admission: EM | Admit: 2019-01-30 | Discharge: 2019-01-30 | Disposition: A | Discharge status: Home / Self Care | Payer: Medicare HMO | Attending: Emergency Medicine | Admitting: Emergency Medicine

## 2019-01-30 ENCOUNTER — Other Ambulatory Visit: Payer: Self-pay

## 2019-01-30 ENCOUNTER — Encounter (HOSPITAL_COMMUNITY): Payer: Self-pay | Admitting: Emergency Medicine

## 2019-01-30 DIAGNOSIS — G43909 Migraine, unspecified, not intractable, without status migrainosus: Secondary | ICD-10-CM | POA: Diagnosis not present

## 2019-01-30 DIAGNOSIS — Z87891 Personal history of nicotine dependence: Secondary | ICD-10-CM | POA: Insufficient documentation

## 2019-01-30 DIAGNOSIS — R519 Headache, unspecified: Secondary | ICD-10-CM | POA: Insufficient documentation

## 2019-01-30 DIAGNOSIS — H60502 Unspecified acute noninfective otitis externa, left ear: Secondary | ICD-10-CM | POA: Diagnosis not present

## 2019-01-30 DIAGNOSIS — Z79899 Other long term (current) drug therapy: Secondary | ICD-10-CM | POA: Insufficient documentation

## 2019-01-30 DIAGNOSIS — I10 Essential (primary) hypertension: Secondary | ICD-10-CM | POA: Diagnosis not present

## 2019-01-30 DIAGNOSIS — H53149 Visual discomfort, unspecified: Secondary | ICD-10-CM | POA: Diagnosis not present

## 2019-01-30 DIAGNOSIS — I6523 Occlusion and stenosis of bilateral carotid arteries: Secondary | ICD-10-CM | POA: Diagnosis not present

## 2019-01-30 DIAGNOSIS — R110 Nausea: Secondary | ICD-10-CM | POA: Diagnosis not present

## 2019-01-30 LAB — CBC WITH DIFFERENTIAL/PLATELET
Abs Immature Granulocytes: 0.01 10*3/uL (ref 0.00–0.07)
Basophils Absolute: 0 10*3/uL (ref 0.0–0.1)
Basophils Relative: 1 %
Eosinophils Absolute: 0.1 10*3/uL (ref 0.0–0.5)
Eosinophils Relative: 2 %
HCT: 39.1 % (ref 36.0–46.0)
Hemoglobin: 12.6 g/dL (ref 12.0–15.0)
Immature Granulocytes: 0 %
Lymphocytes Relative: 45 %
Lymphs Abs: 2.8 10*3/uL (ref 0.7–4.0)
MCH: 30.1 pg (ref 26.0–34.0)
MCHC: 32.2 g/dL (ref 30.0–36.0)
MCV: 93.3 fL (ref 80.0–100.0)
Monocytes Absolute: 0.4 10*3/uL (ref 0.1–1.0)
Monocytes Relative: 7 %
Neutro Abs: 2.8 10*3/uL (ref 1.7–7.7)
Neutrophils Relative %: 45 %
Platelets: 217 10*3/uL (ref 150–400)
RBC: 4.19 MIL/uL (ref 3.87–5.11)
RDW: 12.2 % (ref 11.5–15.5)
WBC: 6.2 10*3/uL (ref 4.0–10.5)
nRBC: 0 % (ref 0.0–0.2)

## 2019-01-30 LAB — BASIC METABOLIC PANEL
Anion gap: 8 (ref 5–15)
BUN: 12 mg/dL (ref 8–23)
CO2: 24 mmol/L (ref 22–32)
Calcium: 8.7 mg/dL — ABNORMAL LOW (ref 8.9–10.3)
Chloride: 107 mmol/L (ref 98–111)
Creatinine, Ser: 0.66 mg/dL (ref 0.44–1.00)
GFR calc Af Amer: >60 mL/min (ref >60)
GFR calc non Af Amer: >60 mL/min (ref >60)
Glucose, Bld: 91 mg/dL (ref 70–99)
Potassium: 3.6 mmol/L (ref 3.5–5.1)
Sodium: 139 mmol/L (ref 135–145)

## 2019-01-30 LAB — SEDIMENTATION RATE: Sed Rate: 23 mm/hr — ABNORMAL HIGH (ref 0–22)

## 2019-01-30 MED ORDER — KETOROLAC TROMETHAMINE 30 MG/ML IJ SOLN
15.0000 mg | Freq: Once | INTRAMUSCULAR | Status: DC
  Filled 2019-01-30: qty 1

## 2019-01-30 MED ORDER — METOCLOPRAMIDE HCL 5 MG/ML IJ SOLN
10.0000 mg | Freq: Once | INTRAMUSCULAR | Status: AC
  Administered 2019-01-30: 10 mg via INTRAVENOUS
  Filled 2019-01-30: qty 2

## 2019-01-30 MED ORDER — NEOMYCIN-POLYMYXIN-HC 3.5-10000-1 OT SUSP: 4.0000 [drp] | Freq: Three times a day (TID) | OTIC | 0 refills | Status: AC

## 2019-01-30 MED ORDER — IOHEXOL 350 MG/ML SOLN
100.0000 mL | Freq: Once | INTRAVENOUS | Status: AC | PRN
  Administered 2019-01-30: 22:00:00 100 mL via INTRAVENOUS

## 2019-01-30 MED ORDER — DIPHENHYDRAMINE HCL 50 MG/ML IJ SOLN
25.0000 mg | Freq: Once | INTRAMUSCULAR | Status: AC
  Administered 2019-01-30: 25 mg via INTRAVENOUS
  Filled 2019-01-30: qty 1

## 2019-01-30 NOTE — Discharge Instructions (Signed)
Take the antibiotics as prescribed.  Follow-up with your doctor as well as the ENT doctor.  Return to the ED if you have new or worsening symptoms.

## 2019-01-30 NOTE — ED Provider Notes (Signed)
[facility] EMERGENCY DEPARTMENT Provider Note   CSN: [id number] Arrival date & time: 01/30/19  1914     History   Chief Complaint Chief Complaint  Patient presents with   Headache    earache    HPI RAYONA SARDINHA is a 68 y.o. female.     Patient with history of anxiety, migraine, chronic ear problems here with a posterior headache with onset yesterday.  She describes pain in the back of her head that came on last night and rapidly progressed.  She cannot say whether is thunderclap onset.  The headache seemed to improve overnight after she took a Phenergan.  Does have a history of migraines and this feels similar though is in a different location.  There is some photophobia and nausea.  No fever.  No focal weakness, numbness or tingling.  She then developed bilateral ear pain which she has chronic issues with left greater than right.  She used some eardrops she has leftover at home with some partial relief.  She denies any drainage or bleeding from her ears.  She complains of pain ongoing for left ear moderately.  She also describes the pain in her head came back again today is gradually progressed associated with some nausea and photophobia.  She did take her blood pressure medication and states compliance.  Denies any chest pain or shortness of breath.  Denies any abdominal pain.  The history is provided by the patient.  Headache Associated symptoms: ear pain, nausea and photophobia   Associated symptoms: no abdominal pain, no congestion, no cough, no diarrhea, no dizziness, no fatigue, no fever, no myalgias, no vomiting and no weakness     Past Medical History:  Diagnosis Date   Anxiety    Arthritis    GERD (gastroesophageal reflux disease)    Hypertension    Migraine    Panic attack    Panic disorder    UTI (lower urinary tract infection)    Vertigo     There are no active problems to display for this patient.   Past Surgical History:  Procedure  Laterality Date   ABDOMINAL HYSTERECTOMY     partial   BREAST BIOPSY     benighn   BREAST SURGERY       OB History    Gravida  4   Para  3   Term  1   Preterm  2   AB  1   Living  3     SAB  1   TAB      Ectopic      Multiple      Live Births               Home Medications    Prior to Admission medications   Medication Sig Start Date End Date Taking? Authorizing Provider  cyclobenzaprine (FLEXERIL) 10 MG tablet Take 1 tablet (10 mg total) by mouth 2 (two) times daily as needed for muscle spasms. 03/25/18   [name], MD  DEXILANT 60 MG capsule Take 60 mg by mouth daily.  05/31/16   [provider]  dicyclomine (BENTYL) 20 MG tablet Take 1 tablet (20 mg total) by mouth 3 (three) times daily as needed (abdominal cramping). 02/15/18   Mesner, Corene Cornea, MD  HYDROcodone-acetaminophen (NORCO/VICODIN) 5-325 MG tablet One tablet every four hours as needed for acute pain.  Limit of five days per [place] statue. 05/09/18   Sanjuana Kava, MD  hydrOXYzine (ATARAX/VISTARIL) 25 MG tablet  Take 1 tablet by mouth three times daily as needed anxiety. 01/12/15   [provider]  lisinopril (PRINIVIL,ZESTRIL) 5 MG tablet Take 0.5 tablets (2.5 mg total) by mouth daily. 04/09/14   [name], MD  meclizine (ANTIVERT) 25 MG tablet TK 1 T PO TID PRN 05/01/18   [provider]  meloxicam (MOBIC) 15 MG tablet  04/17/18   [provider]  pantoprazole (PROTONIX) 40 MG tablet TK 1 T PO QD 05/21/18   [provider]  propranolol (INDERAL) 10 MG tablet Take 10 mg by mouth at bedtime.      [provider]  VENTOLIN HFA 108 [phone] Base) MCG/ACT inhaler  07/23/18   [provider]    Family History Family History  Problem Relation Age of Onset   Hypertension Mother    Diabetes Father     Social History Social History   Tobacco Use   Smoking status: Former Smoker    Packs/day: 2.00    Years: 30.00    Pack years:  60.00    Types: Cigarettes    Quit date: 04/01/2002    Years since quitting: 16.8   Smokeless tobacco: Never Used  Substance Use Topics   Alcohol use: No   Drug use: No     Allergies   Cephalexin, Ibuprofen, Amoxicillin, Bactrim [sulfamethoxazole-trimethoprim], Ciprofloxacin, Imitrex [sumatriptan succinate], Morphine and related, Tetracyclines & related, and Zoloft [sertraline hcl]   Review of Systems Review of Systems  Constitutional: Negative for activity change, appetite change, fatigue and fever.  HENT: Positive for ear pain. Negative for congestion and ear discharge.   Eyes: Positive for photophobia.  Respiratory: Negative for cough, chest tightness and shortness of breath.   Cardiovascular: Negative for chest pain and leg swelling.  Gastrointestinal: Positive for nausea. Negative for abdominal pain, diarrhea and vomiting.  Genitourinary: Negative for dysuria and hematuria.  Musculoskeletal: Negative for arthralgias and myalgias.  Skin: Negative for rash.  Neurological: Positive for headaches. Negative for dizziness and weakness.   all other systems are negative except as noted in the HPI and PMH.     Physical Exam Updated Vital Signs BP (!) 187/82 (BP Location: Right Arm)    Pulse 89    Temp 98.1 F (36.7 C) (Oral)    Resp 18    Ht 5' 8" (1.727 m)    Wt 101.6 kg    SpO2 100%    BMI 34.06 kg/m   Physical Exam Vitals signs and nursing note reviewed.  Constitutional:      General: She is not in acute distress.    Appearance: She is well-developed.  HENT:     Head: Normocephalic and atraumatic.     Right Ear: Tympanic membrane normal.     Left Ear: Tympanic membrane normal.     Ears:     Comments: Scattered cerumen bilaterally.  Tragus pain on the left.  No mastoid tenderness. No tragus pain on the right.    Mouth/Throat:     Pharynx: No oropharyngeal exudate.  Eyes:     Conjunctiva/sclera: Conjunctivae normal.     Pupils: Pupils are equal, round, and reactive  to light.  Neck:     Musculoskeletal: Normal range of motion and neck supple.     Comments: No meningismus. Cardiovascular:     Rate and Rhythm: Normal rate and regular rhythm.     Heart sounds: Normal heart sounds. No murmur.  Pulmonary:     Effort: Pulmonary effort is normal. No respiratory distress.  Breath sounds: Normal breath sounds.  Chest:     Chest wall: No tenderness.  Abdominal:     Palpations: Abdomen is soft.     Tenderness: There is no abdominal tenderness. There is no guarding or rebound.  Musculoskeletal: Normal range of motion.        General: No tenderness.  Skin:    General: Skin is warm.  Neurological:     Mental Status: She is alert and oriented to person, place, and time.     Cranial Nerves: No cranial nerve deficit.     Motor: No abnormal muscle tone.     Coordination: Coordination normal.     Comments: CN 2-12 intact, no ataxia on finger to nose, no nystagmus, 5/5 strength throughout, no pronator drift, Romberg negative, normal gait.   Psychiatric:        Behavior: Behavior normal.      ED Treatments / Results  Labs (all labs ordered are listed, but only abnormal results are displayed) Labs Reviewed  BASIC METABOLIC PANEL - Abnormal; Notable for the following components:      Result Value   Calcium 8.7 (*)    All other components within normal limits  SEDIMENTATION RATE - Abnormal; Notable for the following components:   Sed Rate 23 (*)    All other components within normal limits  CBC WITH DIFFERENTIAL/PLATELET    EKG None  Radiology Ct Angio Head W Or Wo Contrast  Result Date: 01/30/2019 CLINICAL DATA:  Subarachnoid hemorrhage suspected, initial exam. Additional history provided: Headache and bilateral ear pain, history of high blood blood pressure, history of migraines EXAM: CT ANGIOGRAPHY HEAD AND NECK TECHNIQUE: Multidetector CT imaging of the head and neck was performed using the standard protocol during bolus administration of  intravenous contrast. Multiplanar CT image reconstructions and MIPs were obtained to evaluate the vascular anatomy. Carotid stenosis measurements (when applicable) are obtained utilizing NASCET criteria, using the distal internal carotid diameter as the denominator. CONTRAST:  158m OMNIPAQUE IOHEXOL 350 MG/ML SOLN COMPARISON:  No pertinent prior studies available for comparison. a FINDINGS: CT HEAD FINDINGS Brain: No evidence of acute intracranial hemorrhage. No demarcated cortical infarction. No evidence of intracranial mass. No midline shift or extra-axial fluid collection. Cerebral volume is normal for age. Vascular: Reported separately Skull: Normal. Negative for fracture or focal lesion. Sinuses: Complete opacification of the left sphenoid sinus with associated chronic reactive osteitis. No significant mastoid effusion. Orbits: Visualized orbits demonstrate no acute abnormality. Review of the MIP images confirms the above findings CTA NECK FINDINGS Aortic arch: Standard aortic branching. Mild scattered calcified plaque within the visualized aortic arch and proximal major branch vessels of the neck. Right carotid system: CCA patent without stenosis. Soft and calcified plaque within the proximal ICA with resultant 20% stenosis as compared to the more distal vessel. Distal to this, the ICA is widely patent within the neck without stenosis. Left carotid system: Soft plaque within the mid common carotid artery without significant stenosis (50% or greater). Soft and calcified plaque within the proximal left ICA without measurable stenosis as compared to the more distal vessel. Vertebral arteries: Dominant left vertebral artery. The vertebral arteries are patent within neck bilaterally without stenosis. Skeleton: No acute bony abnormality. Mild cervical spondylosis. C4-C5 grade 1 anterolisthesis. Other neck: No soft tissue neck mass or cervical lymphadenopathy. Thyroid negative. Upper chest: No consolidation within  the imaged lung apices. Review of the MIP images confirms the above findings CTA HEAD FINDINGS Anterior circulation: The intracranial internal carotid  arteries are patent bilaterally with minimal atherosclerotic disease and no significant stenosis. The bilateral middle and anterior cerebral are patent without significant proximal stenosis. No intracranial aneurysm is identified. Posterior circulation: The non dominant right vertebral artery is patent and appears to terminate predominantly as the right PICA. The intracranial left vertebral artery is patent without significant stenosis, as is the basilar artery. The bilateral posterior cerebral arteries are patent without significant proximal stenosis. Posterior communicating arteries are present bilaterally. Venous sinuses: Within limitations of contrast timing, no convincing thrombus. Anatomic variants: None significant Review of the MIP images confirms the above findings IMPRESSION: CT head: 1. No evidence of acute intracranial abnormality. 2. Chronic left sphenoid sinusitis. CTA neck: 1. Mild atherosclerotic disease within the great vessels of the neck as described. 2. Bilateral common and internal carotid arteries patent within the neck. 20% stenosis of the proximal right ICA. 3. Bilateral vertebral arteries patent within the neck without significant stenosis. Dominant left vertebral artery. CTA head: 1. No intracranial large vessel occlusion or high-grade proximal arterial stenosis. 2. No intracranial aneurysm is identified. Electronically Signed   By: Kellie Simmering DO   On: 01/30/2019 22:26   Ct Angio Neck W And/or Wo Contrast  Result Date: 01/30/2019 CLINICAL DATA:  Subarachnoid hemorrhage suspected, initial exam. Additional history provided: Headache and bilateral ear pain, history of high blood blood pressure, history of migraines EXAM: CT ANGIOGRAPHY HEAD AND NECK TECHNIQUE: Multidetector CT imaging of the head and neck was performed using the standard  protocol during bolus administration of intravenous contrast. Multiplanar CT image reconstructions and MIPs were obtained to evaluate the vascular anatomy. Carotid stenosis measurements (when applicable) are obtained utilizing NASCET criteria, using the distal internal carotid diameter as the denominator. CONTRAST:  144m OMNIPAQUE IOHEXOL 350 MG/ML SOLN COMPARISON:  No pertinent prior studies available for comparison. a FINDINGS: CT HEAD FINDINGS Brain: No evidence of acute intracranial hemorrhage. No demarcated cortical infarction. No evidence of intracranial mass. No midline shift or extra-axial fluid collection. Cerebral volume is normal for age. Vascular: Reported separately Skull: Normal. Negative for fracture or focal lesion. Sinuses: Complete opacification of the left sphenoid sinus with associated chronic reactive osteitis. No significant mastoid effusion. Orbits: Visualized orbits demonstrate no acute abnormality. Review of the MIP images confirms the above findings CTA NECK FINDINGS Aortic arch: Standard aortic branching. Mild scattered calcified plaque within the visualized aortic arch and proximal major branch vessels of the neck. Right carotid system: CCA patent without stenosis. Soft and calcified plaque within the proximal ICA with resultant 20% stenosis as compared to the more distal vessel. Distal to this, the ICA is widely patent within the neck without stenosis. Left carotid system: Soft plaque within the mid common carotid artery without significant stenosis (50% or greater). Soft and calcified plaque within the proximal left ICA without measurable stenosis as compared to the more distal vessel. Vertebral arteries: Dominant left vertebral artery. The vertebral arteries are patent within neck bilaterally without stenosis. Skeleton: No acute bony abnormality. Mild cervical spondylosis. C4-C5 grade 1 anterolisthesis. Other neck: No soft tissue neck mass or cervical lymphadenopathy. Thyroid  negative. Upper chest: No consolidation within the imaged lung apices. Review of the MIP images confirms the above findings CTA HEAD FINDINGS Anterior circulation: The intracranial internal carotid arteries are patent bilaterally with minimal atherosclerotic disease and no significant stenosis. The bilateral middle and anterior cerebral are patent without significant proximal stenosis. No intracranial aneurysm is identified. Posterior circulation: The non dominant right vertebral artery is patent and  appears to terminate predominantly as the right PICA. The intracranial left vertebral artery is patent without significant stenosis, as is the basilar artery. The bilateral posterior cerebral arteries are patent without significant proximal stenosis. Posterior communicating arteries are present bilaterally. Venous sinuses: Within limitations of contrast timing, no convincing thrombus. Anatomic variants: None significant Review of the MIP images confirms the above findings IMPRESSION: CT head: 1. No evidence of acute intracranial abnormality. 2. Chronic left sphenoid sinusitis. CTA neck: 1. Mild atherosclerotic disease within the great vessels of the neck as described. 2. Bilateral common and internal carotid arteries patent within the neck. 20% stenosis of the proximal right ICA. 3. Bilateral vertebral arteries patent within the neck without significant stenosis. Dominant left vertebral artery. CTA head: 1. No intracranial large vessel occlusion or high-grade proximal arterial stenosis. 2. No intracranial aneurysm is identified. Electronically Signed   By: Kellie Simmering DO   On: 01/30/2019 22:26    Procedures Procedures (including critical care time)  Medications Ordered in ED Medications - No data to display   Initial Impression / Assessment and Plan / ED Course  I have reviewed the triage vital signs and the nursing notes.  Pertinent labs & imaging results that were available during my care of the patient  were reviewed by me and considered in my medical decision making (see chart for details).       Posterior headache coming and going since yesterday associated with high blood pressure and bilateral ear pain.  No neurological deficits.  No fever.  Appears to have otitis externa on the left.  Patient states the headache gradually progressed and then went away and then came back again today.  It is nonexertional   CT head negative.   Given patient's difficulty describing the character of her headache, will proceed with CTA to rule out aneurysm.  This is reassuring and does not show any aneurysm or subarachnoid hemorrhage.  Patient's headache has resolved after Reglan, Toradol and Benadryl.  Blood pressure improved to 151/79.  Low suspicion for meningitis or temporal arteritis.  ESR is 23.  BP improved. Headache has resolved. Will treat for otitis externa  Followup with PCP as well as ENT. Return precautions discussed.  Final Clinical Impressions(s) / ED Diagnoses   Final diagnoses:  Bad headache  Acute otitis externa of left ear, unspecified type    ED Discharge Orders    None       Rancour, Annie Main, MD 01/31/19 0147

## 2019-01-30 NOTE — ED Notes (Signed)
Patient had CT angio ordered. Patient's acuity moved up to a 2 for higher level of care.

## 2019-01-30 NOTE — ED Triage Notes (Signed)
Patient complains of a headache and having bilateral ear pain Patient has a hx of high blood pressure and took her normal night time blood pressure medication of 10 mg's of propanolol in triage. Patient's blood pressure is elevated. Patient has been taking neomycin for her ears x 2 days. Patient also has a hx of migraines.

## 2019-02-27 DIAGNOSIS — K219 Gastro-esophageal reflux disease without esophagitis: Secondary | ICD-10-CM | POA: Diagnosis not present

## 2019-02-27 DIAGNOSIS — M549 Dorsalgia, unspecified: Secondary | ICD-10-CM | POA: Diagnosis not present

## 2019-03-30 DIAGNOSIS — M549 Dorsalgia, unspecified: Secondary | ICD-10-CM | POA: Diagnosis not present

## 2019-03-30 DIAGNOSIS — K219 Gastro-esophageal reflux disease without esophagitis: Secondary | ICD-10-CM | POA: Diagnosis not present

## 2019-04-18 ENCOUNTER — Other Ambulatory Visit: Payer: Self-pay

## 2019-04-18 ENCOUNTER — Emergency Department (HOSPITAL_COMMUNITY)
Admission: EM | Admit: 2019-04-18 | Discharge: 2019-04-18 | Disposition: A | Discharge status: Home / Self Care | Payer: Medicare HMO | Attending: Emergency Medicine | Admitting: Emergency Medicine

## 2019-04-18 ENCOUNTER — Encounter (HOSPITAL_COMMUNITY): Payer: Self-pay | Admitting: *Deleted

## 2019-04-18 DIAGNOSIS — Z87891 Personal history of nicotine dependence: Secondary | ICD-10-CM | POA: Insufficient documentation

## 2019-04-18 DIAGNOSIS — I10 Essential (primary) hypertension: Secondary | ICD-10-CM | POA: Diagnosis not present

## 2019-04-18 DIAGNOSIS — L0291 Cutaneous abscess, unspecified: Secondary | ICD-10-CM

## 2019-04-18 DIAGNOSIS — Z79899 Other long term (current) drug therapy: Secondary | ICD-10-CM | POA: Insufficient documentation

## 2019-04-18 DIAGNOSIS — N611 Abscess of the breast and nipple: Secondary | ICD-10-CM | POA: Insufficient documentation

## 2019-04-18 MED ORDER — AZITHROMYCIN 250 MG PO TABS: ORAL_TABLET | ORAL | 0 refills | Status: AC

## 2019-04-18 NOTE — ED Triage Notes (Signed)
Pt c/o abcess under right breast x 2 days ago;

## 2019-04-18 NOTE — Discharge Instructions (Signed)
Warm compresses, Return if symptoms worsen or change

## 2019-04-18 NOTE — ED Provider Notes (Signed)
[name]   CSN: [id number] Arrival date & time: 04/18/19  0023     History Chief Complaint  Patient presents with  . Abscess    Emily Cooke is a 69 y.o. female.  The history is provided by the patient. No language interpreter was used.  Abscess Location:  Torso Torso abscess location:  L breast Size:  1 cm Abscess quality: painful, redness and warmth   Abscess quality: no fluctuance   Red streaking: no   Pain details:    Quality:  Aching Relieved by:  Nothing Worsened by:  Nothing Ineffective treatments:  None tried Associated symptoms: no nausea and no vomiting    Pt complains of a red swollen area under right breast.  Pt reports she has had the same in the past.  Pt request antibiotics.  Pt states she does not want area excised.  Pt is allergic to multiple antibiotics.  Pt reports her MD gave her zithromax the last time she had an abscess and this worker well.     Past Medical History:  Diagnosis Date  . Anxiety   . Arthritis   . GERD (gastroesophageal reflux disease)   . Hypertension   . Migraine   . Panic attack   . Panic disorder   . UTI (lower urinary tract infection)   . Vertigo     There are no problems to display for this patient.   Past Surgical History:  Procedure Laterality Date  . ABDOMINAL HYSTERECTOMY     partial  . BREAST BIOPSY     benighn  . BREAST SURGERY       OB History    Gravida  4   Para  3   Term  1   Preterm  2   AB  1   Living  3     SAB  1   TAB      Ectopic      Multiple      Live Births              Family History  Problem Relation Age of Onset  . Hypertension Mother   . Diabetes Father     Social History   Tobacco Use  . Smoking status: Former Smoker    Packs/day: 2.00    Years: 30.00    Pack years: 60.00    Types: Cigarettes    Quit date: 04/01/2002    Years since quitting: 17.0  . Smokeless tobacco: Never Used  Substance Use Topics  .  Alcohol use: No  . Drug use: No    Home Medications Prior to Admission medications   Medication Sig Start Date End Date Taking? Authorizing Provider  azithromycin (ZITHROMAX Z-PAK) 250 MG tablet Take 2 tablets (500 mg) on  Day 1,  followed by 1 tablet (250 mg) once daily on Days 2 through 5. 04/18/19 04/23/19  Fransico Meadow, PA-C  cyclobenzaprine (FLEXERIL) 10 MG tablet Take 1 tablet (10 mg total) by mouth 2 (two) times daily as needed for muscle spasms. 03/25/18   [name], MD  DEXILANT 60 MG capsule Take 60 mg by mouth daily.  05/31/16   [provider]  dicyclomine (BENTYL) 20 MG tablet Take 1 tablet (20 mg total) by mouth 3 (three) times daily as needed (abdominal cramping). 02/15/18   Mesner, Corene Cornea, MD  HYDROcodone-acetaminophen (NORCO/VICODIN) 5-325 MG tablet One tablet every four hours as needed for acute pain.  Limit of five days  per [place] statue. 05/09/18   Sanjuana Kava, MD  hydrOXYzine (ATARAX/VISTARIL) 25 MG tablet Take 1 tablet by mouth three times daily as needed anxiety. 01/12/15   [provider]  lisinopril (PRINIVIL,ZESTRIL) 5 MG tablet Take 0.5 tablets (2.5 mg total) by mouth daily. 04/09/14   [name], MD  meclizine (ANTIVERT) 25 MG tablet TK 1 T PO TID PRN 05/01/18   [provider]  meloxicam (MOBIC) 15 MG tablet  04/17/18   [provider]  pantoprazole (PROTONIX) 40 MG tablet TK 1 T PO QD 05/21/18   [provider]  propranolol (INDERAL) 10 MG tablet Take 10 mg by mouth at bedtime.      [provider]  VENTOLIN HFA 108 [phone] Base) MCG/ACT inhaler  07/23/18   [provider]    Allergies    Cephalexin, Ibuprofen, Amoxicillin, Bactrim [sulfamethoxazole-trimethoprim], Ciprofloxacin, Imitrex [sumatriptan succinate], Morphine and related, Tetracyclines & related, and Zoloft [sertraline hcl]  Review of Systems   Review of Systems  Gastrointestinal: Negative for nausea and vomiting.  Skin: Positive for  wound.  All other systems reviewed and are negative.   Physical Exam Updated Vital Signs BP (!) 171/69 (BP Location: Left Arm)   Pulse 99   Temp 97.6 F (36.4 C) (Oral)   Resp 16   Ht 5\' 8"  (1.727 m)   Wt 101.6 kg   SpO2 99%   BMI 34.06 kg/m   Physical Exam Vitals reviewed.  Cardiovascular:     Rate and Rhythm: Normal rate.  Pulmonary:     Effort: Pulmonary effort is normal.  Skin:    General: Skin is warm.     Comments: 1cm swollen area under right breast, tender to palpation   Neurological:     General: No focal deficit present.     Mental Status: She is alert.  Psychiatric:        Mood and Affect: Mood normal.     ED Results / Procedures / Treatments   Labs (all labs ordered are listed, but only abnormal results are displayed) Labs Reviewed - No data to display  EKG None  Radiology No results found.  Procedures Procedures (including critical care time)  Medications Ordered in ED Medications - No data to display  ED Course  I have reviewed the triage vital signs and the nursing notes.  Pertinent labs & imaging results that were available during my care of the patient were reviewed by me and considered in my medical decision making (see chart for details).    MDM Rules/Calculators/A&P                      Pt declined I and D. She wants to try warm compresses and antibiotics  Final Clinical Impression(s) / ED Diagnoses Final diagnoses:  Abscess    Rx / DC Orders ED Discharge Orders         Ordered    azithromycin (ZITHROMAX Z-PAK) 250 MG tablet     04/18/19 0042        An After Visit Summary was printed and given to the patient.   Fransico Meadow, [place] [id number]    Delora Fuel, MD [id number] [phone]

## 2019-04-30 DIAGNOSIS — K219 Gastro-esophageal reflux disease without esophagitis: Secondary | ICD-10-CM | POA: Diagnosis not present

## 2019-04-30 DIAGNOSIS — F419 Anxiety disorder, unspecified: Secondary | ICD-10-CM | POA: Diagnosis not present

## 2019-05-23 DIAGNOSIS — R5383 Other fatigue: Secondary | ICD-10-CM | POA: Diagnosis not present

## 2019-05-23 DIAGNOSIS — M549 Dorsalgia, unspecified: Secondary | ICD-10-CM | POA: Diagnosis not present

## 2019-05-23 DIAGNOSIS — I10 Essential (primary) hypertension: Secondary | ICD-10-CM | POA: Diagnosis not present

## 2019-05-23 DIAGNOSIS — K219 Gastro-esophageal reflux disease without esophagitis: Secondary | ICD-10-CM | POA: Diagnosis not present

## 2019-06-04 DIAGNOSIS — R5383 Other fatigue: Secondary | ICD-10-CM | POA: Diagnosis not present

## 2019-06-04 DIAGNOSIS — K219 Gastro-esophageal reflux disease without esophagitis: Secondary | ICD-10-CM | POA: Diagnosis not present

## 2019-06-04 DIAGNOSIS — I10 Essential (primary) hypertension: Secondary | ICD-10-CM | POA: Diagnosis not present

## 2019-06-11 ENCOUNTER — Ambulatory Visit: Payer: Medicare HMO | Admitting: Dermatology

## 2019-06-20 ENCOUNTER — Emergency Department (HOSPITAL_COMMUNITY)
Admission: EM | Admit: 2019-06-20 | Discharge: 2019-06-20 | Disposition: A | Discharge status: Home / Self Care | Payer: Medicare HMO | Attending: Emergency Medicine | Admitting: Emergency Medicine

## 2019-06-20 ENCOUNTER — Encounter (HOSPITAL_COMMUNITY): Payer: Self-pay

## 2019-06-20 ENCOUNTER — Other Ambulatory Visit: Payer: Self-pay

## 2019-06-20 DIAGNOSIS — Z79899 Other long term (current) drug therapy: Secondary | ICD-10-CM | POA: Insufficient documentation

## 2019-06-20 DIAGNOSIS — Z87891 Personal history of nicotine dependence: Secondary | ICD-10-CM | POA: Diagnosis not present

## 2019-06-20 DIAGNOSIS — I10 Essential (primary) hypertension: Secondary | ICD-10-CM | POA: Insufficient documentation

## 2019-06-20 DIAGNOSIS — K0889 Other specified disorders of teeth and supporting structures: Secondary | ICD-10-CM | POA: Diagnosis not present

## 2019-06-20 MED ORDER — ACETAMINOPHEN 500 MG PO TABS: 500.0000 mg | ORAL_TABLET | Freq: Four times a day (QID) | ORAL | 0 refills | Status: AC | PRN

## 2019-06-20 MED ORDER — ACETAMINOPHEN 325 MG PO TABS
650.0000 mg | ORAL_TABLET | Freq: Once | ORAL | Status: DC
  Filled 2019-06-20: qty 2

## 2019-06-20 MED ORDER — CLINDAMYCIN HCL 150 MG PO CAPS: 450.0000 mg | ORAL_CAPSULE | Freq: Three times a day (TID) | ORAL | 0 refills | Status: DC

## 2019-06-20 NOTE — Discharge Instructions (Addendum)
Please follow-up with your primary care provider, your dentist, as well as the oral surgeon to whom you were referred, Dr. Luvenia Heller.    Please take the clindamycin antibiotics, as prescribed.  You may take Tylenol as needed for symptoms of discomfort.  Please return to the ED or seek immediate medical attention should you experience any new or worsening symptoms.  You have been seen in the Emergency Department (ED) today for dental pain.  Please take your prescribed antibiotic.  You may also take over-the-counter pain medication such as ibuprofen or Tylenol according to the label instructions unless a doctor has previously told you to avoid this type of medication (due to stomach ulcers, kidney disease or liver disease, for example).   Please see you dentist as soon as possible; only a dentist will be able to fix your problem(s).

## 2019-06-20 NOTE — ED Provider Notes (Signed)
[facility] EMERGENCY DEPARTMENT Provider Note   CSN: [id number] Arrival date & time: 06/20/19  2129     History Chief Complaint  Patient presents with  . Dental Pain    Emily Cooke is a 69 y.o. female with no relevant PMH who presents to the ED with complaints of left-sided dental pain and nausea.  Patient denies any trauma, fevers or chills, difficulty breathing, headache or dizziness, difficulty swallowing, trismus, drooling, or voice change.  HPI     Past Medical History:  Diagnosis Date  . Anxiety   . Arthritis   . GERD (gastroesophageal reflux disease)   . Hypertension   . Migraine   . Panic attack   . Panic disorder   . UTI (lower urinary tract infection)   . Vertigo     There are no problems to display for this patient.   Past Surgical History:  Procedure Laterality Date  . ABDOMINAL HYSTERECTOMY     partial  . BREAST BIOPSY     benighn  . BREAST SURGERY       OB History    Gravida  4   Para  3   Term  1   Preterm  2   AB  1   Living  3     SAB  1   TAB      Ectopic      Multiple      Live Births              Family History  Problem Relation Age of Onset  . Hypertension Mother   . Diabetes Father     Social History   Tobacco Use  . Smoking status: Former Smoker    Packs/day: 2.00    Years: 30.00    Pack years: 60.00    Types: Cigarettes    Quit date: 04/01/2002    Years since quitting: 17.2  . Smokeless tobacco: Never Used  Substance Use Topics  . Alcohol use: No  . Drug use: No    Home Medications Prior to Admission medications   Medication Sig Start Date End Date Taking? Authorizing Provider  meclizine (ANTIVERT) 25 MG tablet TK 1 T PO TID PRN 05/01/18  Yes [provider]  pantoprazole (PROTONIX) 40 MG tablet TK 1 T PO QD 05/21/18  Yes [provider]  propranolol (INDERAL) 10 MG tablet Take 10 mg by mouth at bedtime.     Yes [provider]  VENTOLIN HFA 108 (90 Base)  MCG/ACT inhaler  07/23/18  Yes [provider]  acetaminophen (TYLENOL) 500 MG tablet Take 1 tablet (500 mg total) by mouth every 6 (six) hours as needed for moderate pain. 06/20/19   Corena Herter, PA-C  clindamycin (CLEOCIN) 150 MG capsule Take 3 capsules (450 mg total) by mouth 3 (three) times daily. 06/20/19   Corena Herter, PA-C  cyclobenzaprine (FLEXERIL) 10 MG tablet Take 1 tablet (10 mg total) by mouth 2 (two) times daily as needed for muscle spasms. 03/25/18   [name], MD  DEXILANT 60 MG capsule Take 60 mg by mouth daily.  05/31/16   [provider]  dicyclomine (BENTYL) 20 MG tablet Take 1 tablet (20 mg total) by mouth 3 (three) times daily as needed (abdominal cramping). 02/15/18   Mesner, Corene Cornea, MD  HYDROcodone-acetaminophen (NORCO/VICODIN) 5-325 MG tablet One tablet every four hours as needed for acute pain.  Limit of five days per [place]. 05/09/18   Sanjuana Kava,  MD  hydrOXYzine (ATARAX/VISTARIL) 25 MG tablet Take 1 tablet by mouth three times daily as needed anxiety. 01/12/15   [provider]  lisinopril (PRINIVIL,ZESTRIL) 5 MG tablet Take 0.5 tablets (2.5 mg total) by mouth daily. 04/09/14   [name], MD  meloxicam [facility]) 15 MG tablet  04/17/18   [provider]    Allergies    Cephalexin, Ibuprofen, Amoxicillin, Bactrim [sulfamethoxazole-trimethoprim], Ciprofloxacin, Imitrex [sumatriptan succinate], Morphine and related, Tetracyclines & related, and Zoloft [sertraline hcl]  Review of Systems   Review of Systems  Constitutional: Negative for fever.  HENT: Positive for dental problem. Negative for drooling, facial swelling and voice change.   Respiratory: Negative for shortness of breath and wheezing.     Physical Exam Updated Vital Signs BP (!) 176/81 (BP Location: Right Arm)   Pulse 89   Temp 98.3 F (36.8 C) (Oral)   Resp 16   Ht 5\' 8"  (1.727 m)   Wt 101.6 kg   SpO2 98%   BMI 34.06 kg/m   Physical Exam  Vitals and nursing note reviewed. Exam conducted with a chaperone present.  Constitutional:      Appearance: Normal appearance.  HENT:     Head: Normocephalic and atraumatic.     Mouth/Throat:     Comments: Patent oropharynx.  No trismus.  Tolerating secretions well.  No appreciable masses or fluctuance concerning for abscess.  Poor dentition throughout. Eyes:     General: No scleral icterus.    Conjunctiva/sclera: Conjunctivae normal.  Cardiovascular:     Rate and Rhythm: Normal rate and regular rhythm.     Pulses: Normal pulses.     Heart sounds: Normal heart sounds.  Pulmonary:     Effort: Pulmonary effort is normal. No respiratory distress.     Breath sounds: Normal breath sounds. No wheezing.  Musculoskeletal:     Cervical back: Normal range of motion and neck supple. No rigidity.  Skin:    General: Skin is dry.     Capillary Refill: Capillary refill takes less than 2 seconds.  Neurological:     Mental Status: She is alert and oriented to person, place, and time.     GCS: GCS eye subscore is 4. GCS verbal subscore is 5. GCS motor subscore is 6.  Psychiatric:        Mood and Affect: Mood normal.        Behavior: Behavior normal.        Thought Content: Thought content normal.     ED Results / Procedures / Treatments   Labs (all labs ordered are listed, but only abnormal results are displayed) Labs Reviewed - No data to display  EKG None  Radiology No results found.  Procedures Procedures (including critical care time)  Medications Ordered in ED Medications - No data to display  ED Course  I have reviewed the triage vital signs and the nursing notes.  Pertinent labs & imaging results that were available during my care of the patient were reviewed by me and considered in my medical decision making (see chart for details).    MDM Rules/Calculators/A&P                      No abscess amenable to drainage.  Afebrile and denies any fevers, trismus, or  drooling.  Physical exam overall reassuring.  Patient is hemodynamically stable in no acute distress.  Will prescribe Clindamycin.  Tylenol for pain symptoms.  Will refer her to oral surgery and  encourage her to follow-up with her primary care provider.  Strict ED return precautions discussed.  Patient voices understanding and is agreeable to plan.    Final Clinical Impression(s) / ED Diagnoses Final diagnoses:  Pain, dental    Rx / DC Orders ED Discharge Orders         Ordered    clindamycin (CLEOCIN) 150 MG capsule  3 times daily     06/20/19 2309    acetaminophen (TYLENOL) 500 MG tablet  Every 6 hours PRN     06/20/19 2309           Corena Herter, PA-C 07/14/19 0816    Truddie Hidden, MD 07/14/19 [phone]

## 2019-06-20 NOTE — ED Triage Notes (Signed)
Pt reports current nausea and left posterior dental pain where she believes she aggravated a known cavity and possibly infected tooth when she ate some pancakes with syrup for dinner. Pt currently prescribed Clindamycin but cannot tell this RN why she is prescribed it and reports she does not take her medications as they are prescribed. No headache at this time during Triage.

## 2019-06-23 DIAGNOSIS — I10 Essential (primary) hypertension: Secondary | ICD-10-CM | POA: Diagnosis not present

## 2019-06-23 DIAGNOSIS — K219 Gastro-esophageal reflux disease without esophagitis: Secondary | ICD-10-CM | POA: Diagnosis not present

## 2019-07-16 DIAGNOSIS — K5900 Constipation, unspecified: Secondary | ICD-10-CM | POA: Diagnosis not present

## 2019-07-16 DIAGNOSIS — K219 Gastro-esophageal reflux disease without esophagitis: Secondary | ICD-10-CM | POA: Diagnosis not present

## 2019-08-06 ENCOUNTER — Other Ambulatory Visit (HOSPITAL_COMMUNITY): Payer: Self-pay | Admitting: Internal Medicine

## 2019-08-06 DIAGNOSIS — Z1231 Encounter for screening mammogram for malignant neoplasm of breast: Secondary | ICD-10-CM

## 2019-08-16 DIAGNOSIS — I10 Essential (primary) hypertension: Secondary | ICD-10-CM | POA: Diagnosis not present

## 2019-08-16 DIAGNOSIS — K219 Gastro-esophageal reflux disease without esophagitis: Secondary | ICD-10-CM | POA: Diagnosis not present

## 2019-09-15 DIAGNOSIS — K219 Gastro-esophageal reflux disease without esophagitis: Secondary | ICD-10-CM | POA: Diagnosis not present

## 2019-09-15 DIAGNOSIS — M549 Dorsalgia, unspecified: Secondary | ICD-10-CM | POA: Diagnosis not present

## 2019-09-16 ENCOUNTER — Other Ambulatory Visit: Payer: Self-pay

## 2019-09-16 ENCOUNTER — Ambulatory Visit (HOSPITAL_COMMUNITY)
Admission: RE | Admit: 2019-09-16 | Discharge: 2019-09-16 | Disposition: A | Discharge status: Home / Self Care | Payer: Medicare HMO | Source: Ambulatory Visit | Attending: Internal Medicine | Admitting: Internal Medicine

## 2019-09-16 DIAGNOSIS — Z1231 Encounter for screening mammogram for malignant neoplasm of breast: Secondary | ICD-10-CM | POA: Diagnosis not present

## 2019-10-16 DIAGNOSIS — K219 Gastro-esophageal reflux disease without esophagitis: Secondary | ICD-10-CM | POA: Diagnosis not present

## 2019-10-16 DIAGNOSIS — I10 Essential (primary) hypertension: Secondary | ICD-10-CM | POA: Diagnosis not present

## 2019-11-13 DIAGNOSIS — Z0001 Encounter for general adult medical examination with abnormal findings: Secondary | ICD-10-CM | POA: Diagnosis not present

## 2019-11-13 DIAGNOSIS — I10 Essential (primary) hypertension: Secondary | ICD-10-CM | POA: Diagnosis not present

## 2019-11-13 DIAGNOSIS — G43909 Migraine, unspecified, not intractable, without status migrainosus: Secondary | ICD-10-CM | POA: Diagnosis not present

## 2019-11-13 DIAGNOSIS — Z1331 Encounter for screening for depression: Secondary | ICD-10-CM | POA: Diagnosis not present

## 2019-11-13 DIAGNOSIS — K219 Gastro-esophageal reflux disease without esophagitis: Secondary | ICD-10-CM | POA: Diagnosis not present

## 2019-11-13 DIAGNOSIS — Z1389 Encounter for screening for other disorder: Secondary | ICD-10-CM | POA: Diagnosis not present

## 2019-11-14 DIAGNOSIS — Z79899 Other long term (current) drug therapy: Secondary | ICD-10-CM | POA: Diagnosis not present

## 2019-11-14 DIAGNOSIS — I10 Essential (primary) hypertension: Secondary | ICD-10-CM | POA: Diagnosis not present

## 2019-11-14 DIAGNOSIS — Z0001 Encounter for general adult medical examination with abnormal findings: Secondary | ICD-10-CM | POA: Diagnosis not present

## 2019-11-14 DIAGNOSIS — Z6834 Body mass index (BMI) 34.0-34.9, adult: Secondary | ICD-10-CM | POA: Diagnosis not present

## 2019-11-21 ENCOUNTER — Encounter (HOSPITAL_COMMUNITY): Payer: Self-pay

## 2019-12-13 DIAGNOSIS — I10 Essential (primary) hypertension: Secondary | ICD-10-CM | POA: Diagnosis not present

## 2019-12-13 DIAGNOSIS — K219 Gastro-esophageal reflux disease without esophagitis: Secondary | ICD-10-CM | POA: Diagnosis not present

## 2019-12-18 ENCOUNTER — Other Ambulatory Visit: Payer: Self-pay

## 2019-12-18 ENCOUNTER — Encounter (HOSPITAL_COMMUNITY): Payer: Self-pay

## 2019-12-18 ENCOUNTER — Emergency Department (HOSPITAL_COMMUNITY)
Admission: EM | Admit: 2019-12-18 | Discharge: 2019-12-18 | Disposition: A | Discharge status: Home / Self Care | Payer: Medicare HMO | Attending: Emergency Medicine | Admitting: Emergency Medicine

## 2019-12-18 DIAGNOSIS — R42 Dizziness and giddiness: Secondary | ICD-10-CM | POA: Diagnosis not present

## 2019-12-18 DIAGNOSIS — R61 Generalized hyperhidrosis: Secondary | ICD-10-CM | POA: Diagnosis not present

## 2019-12-18 DIAGNOSIS — R55 Syncope and collapse: Secondary | ICD-10-CM | POA: Insufficient documentation

## 2019-12-18 DIAGNOSIS — R110 Nausea: Secondary | ICD-10-CM | POA: Insufficient documentation

## 2019-12-18 DIAGNOSIS — Z5321 Procedure and treatment not carried out due to patient leaving prior to being seen by health care provider: Secondary | ICD-10-CM | POA: Insufficient documentation

## 2019-12-18 LAB — CBC
HCT: 40 % (ref 36.0–46.0)
Hemoglobin: 13.2 g/dL (ref 12.0–15.0)
MCH: 30.1 pg (ref 26.0–34.0)
MCHC: 33 g/dL (ref 30.0–36.0)
MCV: 91.3 fL (ref 80.0–100.0)
Platelets: 210 10*3/uL (ref 150–400)
RBC: 4.38 MIL/uL (ref 3.87–5.11)
RDW: 12.3 % (ref 11.5–15.5)
WBC: 5.9 10*3/uL (ref 4.0–10.5)
nRBC: 0 % (ref 0.0–0.2)

## 2019-12-18 LAB — BASIC METABOLIC PANEL
Anion gap: 9 (ref 5–15)
BUN: 11 mg/dL (ref 8–23)
CO2: 27 mmol/L (ref 22–32)
Calcium: 9.5 mg/dL (ref 8.9–10.3)
Chloride: 102 mmol/L (ref 98–111)
Creatinine, Ser: 0.67 mg/dL (ref 0.44–1.00)
GFR, Estimated: >60 mL/min (ref >60)
Glucose, Bld: 103 mg/dL — ABNORMAL HIGH (ref 70–99)
Potassium: 4.8 mmol/L (ref 3.5–5.1)
Sodium: 138 mmol/L (ref 135–145)

## 2019-12-18 NOTE — ED Triage Notes (Signed)
Pt to er, pt c/o nausea, states that she also feels like she is going to pass out.  States that she is feeling dizzy, pt is diaphoretic.

## 2019-12-26 DIAGNOSIS — G43909 Migraine, unspecified, not intractable, without status migrainosus: Secondary | ICD-10-CM | POA: Diagnosis not present

## 2019-12-26 DIAGNOSIS — I10 Essential (primary) hypertension: Secondary | ICD-10-CM | POA: Diagnosis not present

## 2020-01-28 ENCOUNTER — Ambulatory Visit (INDEPENDENT_AMBULATORY_CARE_PROVIDER_SITE_OTHER): Payer: Medicare HMO | Admitting: Dermatology

## 2020-01-28 ENCOUNTER — Other Ambulatory Visit: Payer: Self-pay

## 2020-01-28 DIAGNOSIS — L820 Inflamed seborrheic keratosis: Secondary | ICD-10-CM

## 2020-01-28 DIAGNOSIS — L853 Xerosis cutis: Secondary | ICD-10-CM | POA: Diagnosis not present

## 2020-01-28 DIAGNOSIS — L304 Erythema intertrigo: Secondary | ICD-10-CM | POA: Diagnosis not present

## 2020-01-28 DIAGNOSIS — D1800 Hemangioma unspecified site: Secondary | ICD-10-CM

## 2020-01-28 MED ORDER — HYDROCORTISONE 2.5 % EX CREA: TOPICAL_CREAM | Freq: Two times a day (BID) | CUTANEOUS | 3 refills | Status: AC | PRN

## 2020-01-28 NOTE — Progress Notes (Signed)
   Follow-Up Visit   Subjective  Emily Cooke is a 69 y.o. female who presents for the following: Spot check (Pt states that she has some skin tag that have been very itchy on her left arm, face, and around breasts).   The following portions of the chart were reviewed this encounter and updated as appropriate:     Review of Systems: No other skin or systemic complaints except as noted in HPI or Assessment and Plan.   Objective  Well appearing patient in no apparent distress; mood and affect are within normal limits.  A focused examination was performed including face, chest, arms, legs, abd. Relevant physical exam findings are noted in the Assessment and Plan.  Objective  inframammary: Erythema R inframammary and intermammary chest, itchy per pt  Objective  left arm x 3, chest, left temple x 1, right inframammary x 2,  left neck x 2, right neck x 3, intermammary x 4 (16): Erythematous keratotic or waxy stuck-on papule or plaque.   Assessment & Plan  Intertrigo inframammary  Start HC 2.5% cream. Apply to affected areas 1-2 times a day prn for itching.   hydrocortisone 2.5 % cream - inframammary  Inflamed seborrheic keratosis (16) left arm x 3, chest, left temple x 1, right inframammary x 2,  left neck x 2, right neck x 3, intermammary x 4  Prior to procedure, discussed risks of blister formation, small wound, skin dyspigmentation, or rare scar following cryotherapy.    Destruction of lesion - left arm x 3, chest, left temple x 1, right inframammary x 2,  left neck x 2, right neck x 3, intermammary x 4  Destruction method: cryotherapy   Informed consent: discussed and consent obtained   Lesion destroyed using liquid nitrogen: Yes   Region frozen until ice ball extended beyond lesion: Yes   Outcome: patient tolerated procedure well with no complications   Post-procedure details: wound care instructions given    Xerosis - diffuse xerotic patches arms, legs, trunk -  recommend gentle, hydrating skin care, samples given - gentle skin care handout given  Hemangiomas - Red papules trunk - Discussed benign nature - Observe - Call for any changes  Return if symptoms worsen or fail to improve.   I, Harriett Sine, CMA, am acting as scribe for Brendolyn Patty, MD.  Documentation: I have reviewed the above documentation for accuracy and completeness, and I agree with the above.  Brendolyn Patty MD

## 2020-01-28 NOTE — Patient Instructions (Signed)

## 2020-01-29 DIAGNOSIS — H6092 Unspecified otitis externa, left ear: Secondary | ICD-10-CM | POA: Diagnosis not present

## 2020-01-29 DIAGNOSIS — I10 Essential (primary) hypertension: Secondary | ICD-10-CM | POA: Diagnosis not present

## 2020-02-29 DIAGNOSIS — K219 Gastro-esophageal reflux disease without esophagitis: Secondary | ICD-10-CM | POA: Diagnosis not present

## 2020-02-29 DIAGNOSIS — I10 Essential (primary) hypertension: Secondary | ICD-10-CM | POA: Diagnosis not present

## 2020-03-10 ENCOUNTER — Emergency Department (HOSPITAL_COMMUNITY)
Admission: EM | Admit: 2020-03-10 | Discharge: 2020-03-10 | Discharge status: Against Medical Advice | Payer: Medicare HMO

## 2020-03-10 ENCOUNTER — Other Ambulatory Visit: Payer: Self-pay

## 2020-03-10 NOTE — ED Triage Notes (Signed)
Left before triage

## 2020-03-31 DIAGNOSIS — I10 Essential (primary) hypertension: Secondary | ICD-10-CM | POA: Diagnosis not present

## 2020-03-31 DIAGNOSIS — K219 Gastro-esophageal reflux disease without esophagitis: Secondary | ICD-10-CM | POA: Diagnosis not present

## 2020-04-23 ENCOUNTER — Emergency Department (HOSPITAL_COMMUNITY): Payer: Medicare HMO

## 2020-04-23 ENCOUNTER — Emergency Department (HOSPITAL_COMMUNITY)
Admission: EM | Admit: 2020-04-23 | Discharge: 2020-04-23 | Disposition: A | Discharge status: Home / Self Care | Payer: Medicare HMO | Attending: Emergency Medicine | Admitting: Emergency Medicine

## 2020-04-23 ENCOUNTER — Encounter (HOSPITAL_COMMUNITY): Payer: Self-pay | Admitting: *Deleted

## 2020-04-23 ENCOUNTER — Other Ambulatory Visit: Payer: Self-pay

## 2020-04-23 DIAGNOSIS — S2241XA Multiple fractures of ribs, right side, initial encounter for closed fracture: Secondary | ICD-10-CM | POA: Diagnosis not present

## 2020-04-23 DIAGNOSIS — Z79899 Other long term (current) drug therapy: Secondary | ICD-10-CM | POA: Diagnosis not present

## 2020-04-23 DIAGNOSIS — J984 Other disorders of lung: Secondary | ICD-10-CM | POA: Diagnosis not present

## 2020-04-23 DIAGNOSIS — I10 Essential (primary) hypertension: Secondary | ICD-10-CM | POA: Insufficient documentation

## 2020-04-23 DIAGNOSIS — X58XXXA Exposure to other specified factors, initial encounter: Secondary | ICD-10-CM | POA: Insufficient documentation

## 2020-04-23 DIAGNOSIS — R079 Chest pain, unspecified: Secondary | ICD-10-CM | POA: Diagnosis not present

## 2020-04-23 DIAGNOSIS — Z87891 Personal history of nicotine dependence: Secondary | ICD-10-CM | POA: Insufficient documentation

## 2020-04-23 DIAGNOSIS — K219 Gastro-esophageal reflux disease without esophagitis: Secondary | ICD-10-CM | POA: Insufficient documentation

## 2020-04-23 DIAGNOSIS — S299XXA Unspecified injury of thorax, initial encounter: Secondary | ICD-10-CM | POA: Diagnosis present

## 2020-04-23 DIAGNOSIS — S2231XA Fracture of one rib, right side, initial encounter for closed fracture: Secondary | ICD-10-CM | POA: Insufficient documentation

## 2020-04-23 LAB — COMPREHENSIVE METABOLIC PANEL
ALT: 43 U/L (ref 0–44)
AST: 44 U/L — ABNORMAL HIGH (ref 15–41)
Albumin: 4.5 g/dL (ref 3.5–5.0)
Alkaline Phosphatase: 83 U/L (ref 38–126)
Anion gap: 7 (ref 5–15)
BUN: 10 mg/dL (ref 8–23)
CO2: 25 mmol/L (ref 22–32)
Calcium: 9.2 mg/dL (ref 8.9–10.3)
Chloride: 104 mmol/L (ref 98–111)
Creatinine, Ser: 0.77 mg/dL (ref 0.44–1.00)
GFR, Estimated: >60 mL/min (ref >60)
Glucose, Bld: 102 mg/dL — ABNORMAL HIGH (ref 70–99)
Potassium: 4 mmol/L (ref 3.5–5.1)
Sodium: 136 mmol/L (ref 135–145)
Total Bilirubin: 0.4 mg/dL (ref 0.3–1.2)
Total Protein: 7.5 g/dL (ref 6.5–8.1)

## 2020-04-23 LAB — URINALYSIS, ROUTINE W REFLEX MICROSCOPIC
Bilirubin Urine: NEGATIVE
Glucose, UA: NEGATIVE mg/dL
Hgb urine dipstick: NEGATIVE
Ketones, ur: NEGATIVE mg/dL
Leukocytes,Ua: NEGATIVE
Nitrite: NEGATIVE
Protein, ur: NEGATIVE mg/dL
Specific Gravity, Urine: 1.002 — ABNORMAL LOW (ref 1.005–1.030)
pH: 6 (ref 5.0–8.0)

## 2020-04-23 LAB — CBC
HCT: 41.2 % (ref 36.0–46.0)
Hemoglobin: 13.3 g/dL (ref 12.0–15.0)
MCH: 30.2 pg (ref 26.0–34.0)
MCHC: 32.3 g/dL (ref 30.0–36.0)
MCV: 93.4 fL (ref 80.0–100.0)
Platelets: 220 10*3/uL (ref 150–400)
RBC: 4.41 MIL/uL (ref 3.87–5.11)
RDW: 13 % (ref 11.5–15.5)
WBC: 7.5 10*3/uL (ref 4.0–10.5)
nRBC: 0 % (ref 0.0–0.2)

## 2020-04-23 MED ORDER — HYDROCODONE-ACETAMINOPHEN 5-325 MG PO TABS: 1.0000 | ORAL_TABLET | ORAL | 0 refills | Status: AC | PRN

## 2020-04-23 NOTE — ED Provider Notes (Signed)
[facility] EMERGENCY DEPARTMENT Provider Note   CSN: [id number] Arrival date & time: 04/23/20  1428     History No chief complaint on file.   Emily Cooke is a 70 y.o. female.  The history is provided by the patient. No language interpreter was used.  Chest Pain Pain location:  R chest Pain quality: aching and stabbing   Pain radiates to:  Does not radiate Pain severity:  Moderate Onset quality:  Gradual Timing:  Constant Progression:  Worsening Relieved by:  Nothing Worsened by:  Nothing Ineffective treatments:  None tried Pt reports she bent in an unusual way and had a pop in her chest.     Past Medical History:  Diagnosis Date  . Anxiety   . Arthritis   . GERD (gastroesophageal reflux disease)   . Hypertension   . Migraine   . Panic attack   . Panic disorder   . UTI (lower urinary tract infection)   . Vertigo     There are no problems to display for this patient.   Past Surgical History:  Procedure Laterality Date  . ABDOMINAL HYSTERECTOMY     partial  . BREAST BIOPSY     benighn  . BREAST SURGERY       OB History    Gravida  4   Para  3   Term  1   Preterm  2   AB  1   Living  3     SAB  1   IAB      Ectopic      Multiple      Live Births              Family History  Problem Relation Age of Onset  . Hypertension Mother   . Diabetes Father     Social History   Tobacco Use  . Smoking status: Former Smoker    Packs/day: 2.00    Years: 30.00    Pack years: 60.00    Types: Cigarettes    Quit date: 04/01/2002    Years since quitting: 18.0  . Smokeless tobacco: Never Used  Vaping Use  . Vaping Use: Never used  Substance Use Topics  . Alcohol use: No  . Drug use: No    Home Medications Prior to Admission medications   Medication Sig Start Date End Date Taking? Authorizing Provider  HYDROcodone-acetaminophen (NORCO/VICODIN) 5-325 MG tablet Take 1 tablet by mouth every 4 (four) hours as needed for moderate  pain. 04/23/20 04/23/21 Yes Fransico Meadow, PA-C  acetaminophen (TYLENOL) 500 MG tablet Take 1 tablet (500 mg total) by mouth every 6 (six) hours as needed for moderate pain. 06/20/19   Corena Herter, PA-C  amLODipine (NORVASC) 2.5 MG tablet  12/26/19   [provider]  clindamycin (CLEOCIN) 150 MG capsule Take 3 capsules (450 mg total) by mouth 3 (three) times daily. 06/20/19   Corena Herter, PA-C  cyclobenzaprine (FLEXERIL) 10 MG tablet Take 1 tablet (10 mg total) by mouth 2 (two) times daily as needed for muscle spasms. 03/25/18   [name], MD  DEXILANT 60 MG capsule Take 60 mg by mouth daily.  05/31/16   [provider]  dicyclomine (BENTYL) 20 MG tablet Take 1 tablet (20 mg total) by mouth 3 (three) times daily as needed (abdominal cramping). 02/15/18   Mesner, Corene Cornea, MD  hydrocortisone 2.5 % cream Apply topically 2 (two) times daily as needed (Rash). 01/28/20   Brendolyn Patty, MD  hydrOXYzine (ATARAX/VISTARIL) 25 MG tablet Take 1 tablet by mouth three times daily as needed anxiety. 01/12/15   [provider]  lisinopril (PRINIVIL,ZESTRIL) 5 MG tablet Take 0.5 tablets (2.5 mg total) by mouth daily. 04/09/14   [name], MD  meclizine (ANTIVERT) 25 MG tablet TK 1 T PO TID PRN 05/01/18   [provider]  meloxicam (MOBIC) 15 MG tablet  04/17/18   [provider]  pantoprazole (PROTONIX) 40 MG tablet TK 1 T PO QD 05/21/18   [provider]  propranolol (INDERAL) 10 MG tablet Take 10 mg by mouth at bedtime.      [provider]  VENTOLIN HFA 108 [phone] Base) MCG/ACT inhaler  07/23/18   [provider]    Allergies    Cephalexin, Ibuprofen, Amoxicillin, Bactrim [sulfamethoxazole-trimethoprim], Ciprofloxacin, Imitrex [sumatriptan succinate], Morphine and related, Tetracyclines & related, and Zoloft [sertraline hcl]  Review of Systems   Review of Systems  Cardiovascular: Positive for chest pain.  All other systems  reviewed and are negative.   Physical Exam Updated Vital Signs BP 120/79 (BP Location: Right Arm)   Pulse 82   Temp 98.1 F (36.7 C) (Oral)   Resp 16   Ht 5\' 8"  (1.727 m)   Wt 101.6 kg   SpO2 100%   BMI 34.06 kg/m   Physical Exam Vitals and nursing note reviewed.  Constitutional:      Appearance: She is well-developed and well-nourished.  HENT:     Head: Normocephalic.     Nose: Nose normal.     Mouth/Throat:     Mouth: Mucous membranes are moist.  Eyes:     Extraocular Movements: EOM normal.  Cardiovascular:     Rate and Rhythm: Normal rate.     Pulses: Normal pulses.     Comments: Tender right lower ribs.  Pulmonary:     Effort: Pulmonary effort is normal.  Abdominal:     General: Abdomen is flat. There is no distension.     Tenderness: There is no abdominal tenderness.  Musculoskeletal:        General: Normal range of motion.     Cervical back: Normal range of motion.  Neurological:     General: No focal deficit present.     Mental Status: She is alert and oriented to person, place, and time.  Psychiatric:        Mood and Affect: Mood and affect and mood normal.     ED Results / Procedures / Treatments   Labs (all labs ordered are listed, but only abnormal results are displayed) Labs Reviewed  URINALYSIS, ROUTINE W REFLEX MICROSCOPIC - Abnormal; Notable for the following components:      Result Value   Color, Urine COLORLESS (*)    Specific Gravity, Urine 1.002 (*)    All other components within normal limits  COMPREHENSIVE METABOLIC PANEL - Abnormal; Notable for the following components:   Glucose, Bld 102 (*)    AST 44 (*)    All other components within normal limits  CBC    EKG None  Radiology DG Ribs Unilateral W/Chest Right  Result Date: 04/23/2020 CLINICAL DATA:  Pain following fall EXAM: RIGHT RIBS AND CHEST - 3+ VIEW COMPARISON:  Chest radiograph April 01, 2016 FINDINGS: Frontal chest as well as oblique and cone-down rib images were  obtained. There is slight scarring in the left base. The lungs elsewhere are clear. Heart size and pulmonary vascularity are normal. No adenopathy. There is a subtle nondisplaced  fracture of the anterior right eighth rib. No other fracture. No pneumothorax or pleural effusion. IMPRESSION: Nondisplaced fracture anterior right eighth rib. No other fracture appreciable. No pneumothorax or pleural effusion. Slight scarring left base. No edema or airspace opacity. Electronically Signed   By: Lowella Grip III M.D.   On: 04/23/2020 17:39    Procedures Procedures   Medications Ordered in ED Medications - No data to display  ED Course  I have reviewed the triage vital signs and the nursing notes.  Pertinent labs & imaging results that were available during my care of the patient were reviewed by me and considered in my medical decision making (see chart for details).    MDM Rules/Calculators/A&P                          MDM: Chest xray shows a right 8th rib fracture.  Pt counseled on rib fractures and treatment.  Final Clinical Impression(s) / ED Diagnoses Final diagnoses:  Closed fracture of one rib of right side, initial encounter    Rx / DC Orders ED Discharge Orders         Ordered    HYDROcodone-acetaminophen (NORCO/VICODIN) 5-325 MG tablet  Every 4 hours PRN        04/23/20 1800        An After Visit Summary was printed and given to the patient.    Fransico Meadow, Hershal Coria 04/23/20 2150    Fredia Sorrow, MD 05/13/20 [phone]

## 2020-04-23 NOTE — Discharge Instructions (Signed)
Return if any problems.  See your Physicain for recheck in 1 week  

## 2020-04-23 NOTE — ED Triage Notes (Signed)
C/o dizziness with nausea

## 2020-04-23 NOTE — ED Triage Notes (Signed)
[place], 2 days ago, pain in right breast, states she used salonpas and not has a red rash to area. C/o pain in right rib cage

## 2020-05-01 DIAGNOSIS — K219 Gastro-esophageal reflux disease without esophagitis: Secondary | ICD-10-CM | POA: Diagnosis not present

## 2020-05-01 DIAGNOSIS — I10 Essential (primary) hypertension: Secondary | ICD-10-CM | POA: Diagnosis not present

## 2020-05-11 DIAGNOSIS — K219 Gastro-esophageal reflux disease without esophagitis: Secondary | ICD-10-CM | POA: Diagnosis not present

## 2020-05-11 DIAGNOSIS — I10 Essential (primary) hypertension: Secondary | ICD-10-CM | POA: Diagnosis not present

## 2020-05-11 DIAGNOSIS — M549 Dorsalgia, unspecified: Secondary | ICD-10-CM | POA: Diagnosis not present

## 2020-05-11 DIAGNOSIS — S2231XD Fracture of one rib, right side, subsequent encounter for fracture with routine healing: Secondary | ICD-10-CM | POA: Diagnosis not present

## 2020-06-11 DIAGNOSIS — I10 Essential (primary) hypertension: Secondary | ICD-10-CM | POA: Diagnosis not present

## 2020-06-11 DIAGNOSIS — K219 Gastro-esophageal reflux disease without esophagitis: Secondary | ICD-10-CM | POA: Diagnosis not present

## 2020-07-11 DIAGNOSIS — K219 Gastro-esophageal reflux disease without esophagitis: Secondary | ICD-10-CM | POA: Diagnosis not present

## 2020-07-11 DIAGNOSIS — I10 Essential (primary) hypertension: Secondary | ICD-10-CM | POA: Diagnosis not present

## 2020-07-14 IMAGING — DX DG WRIST COMPLETE 3+V*L*
4 series · 4 of 4 positions shown · non-contrast
Comparison: 08/02/2016

CLINICAL DATA: Fall with left wrist pain.

EXAM:
LEFT WRIST - COMPLETE 3+ VIEW

[wrist pa]
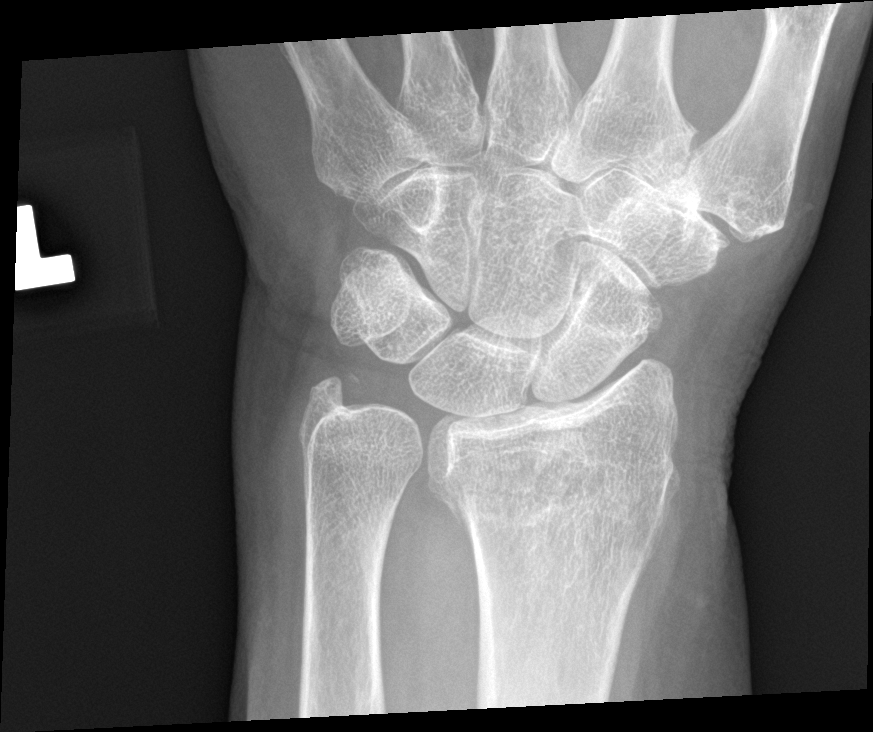

[wrist obl]
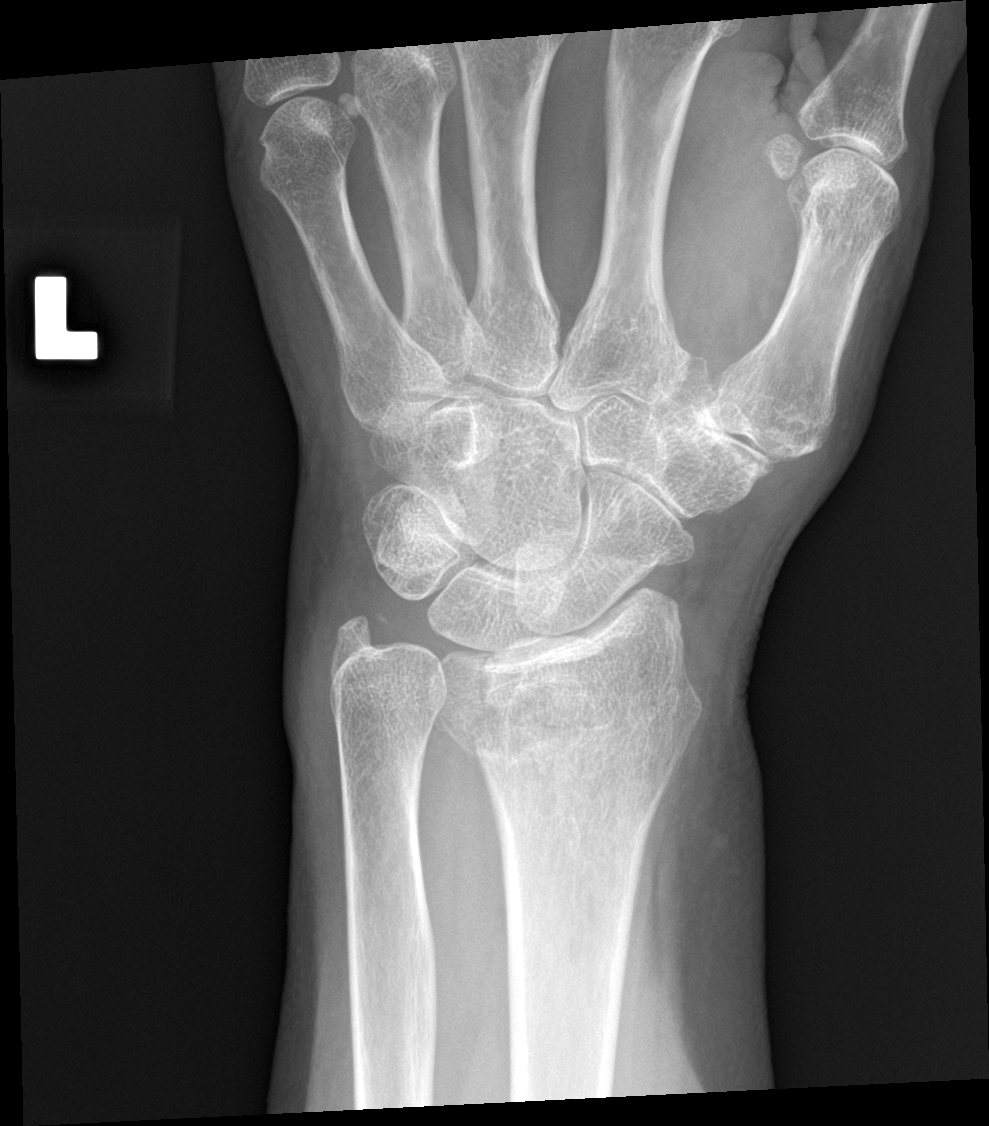

[wrist lat]
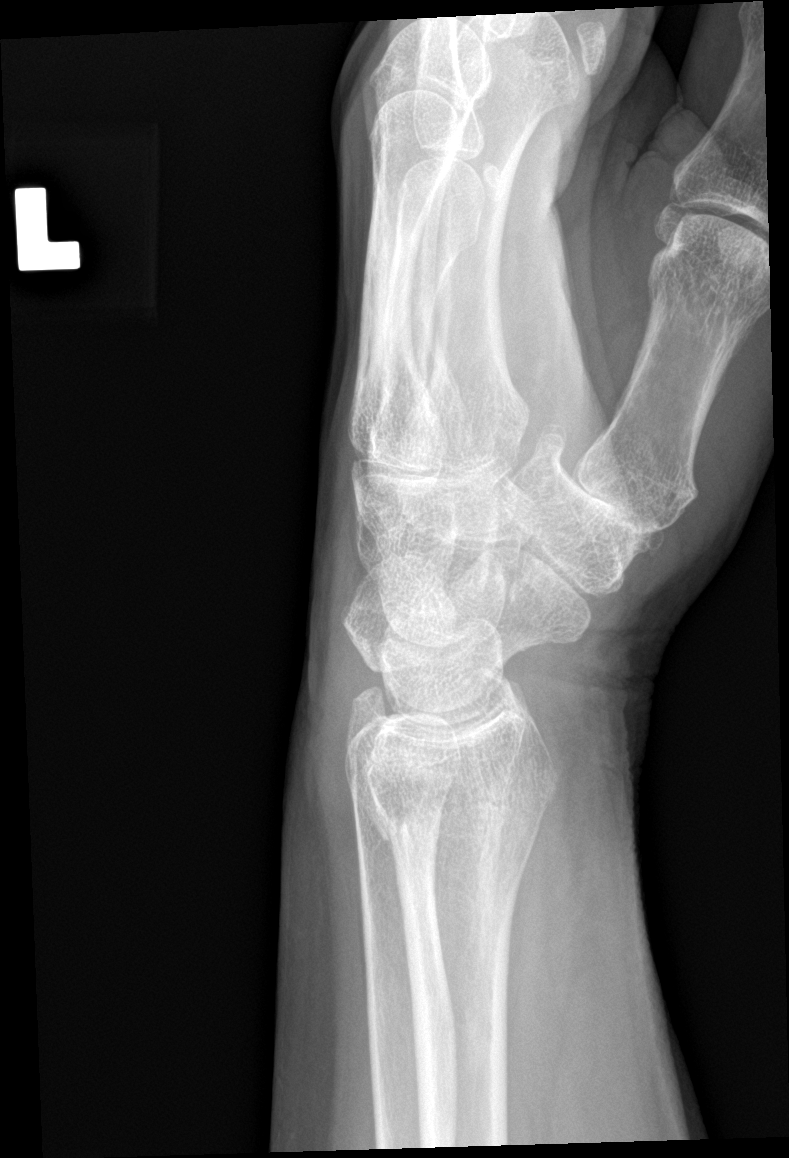

[wrist navicular]
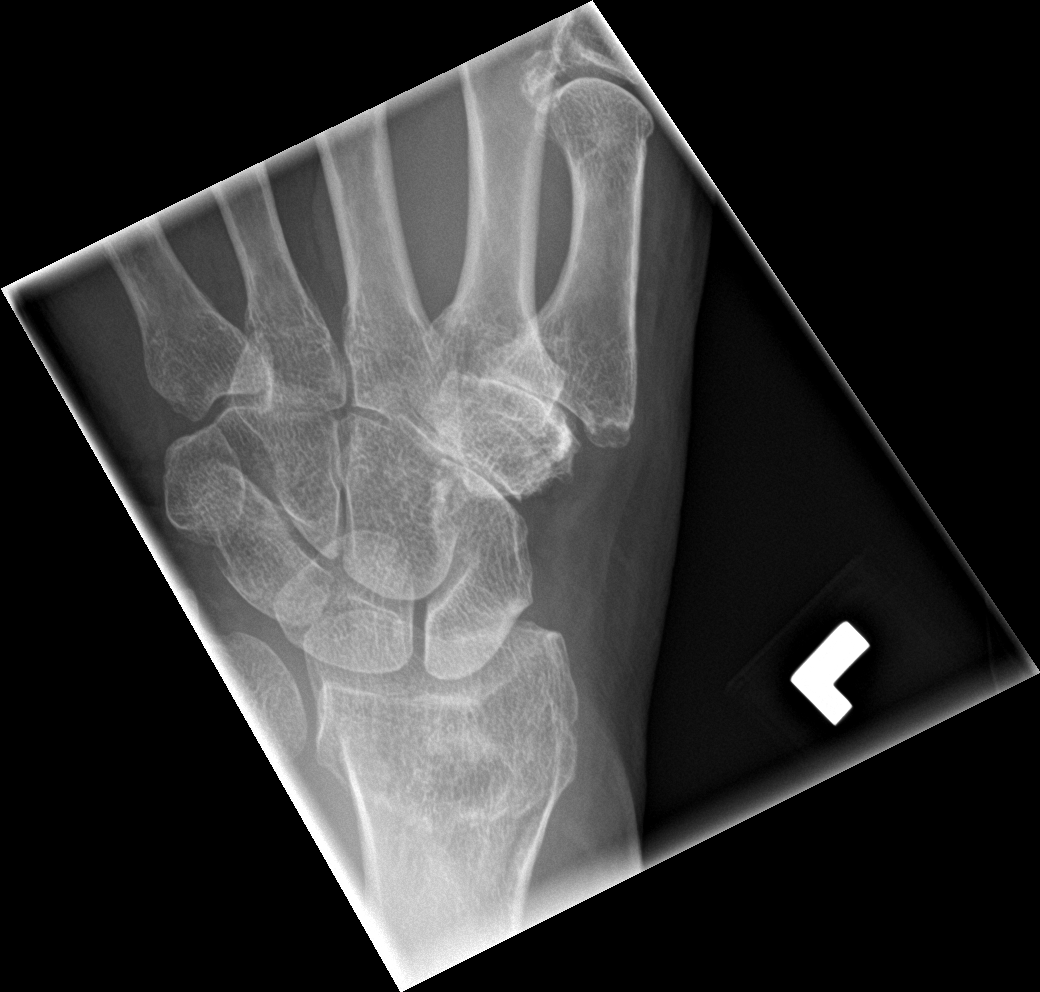

[4 of 4 positions shown; findings below may reference images not displayed]

FINDINGS: Degenerate changes at the base of the thumb and radiocarpal
articulation. Minimally impacted distal radius fracture with
intra-articular extension about the ulnar side of the radiocarpal
articulation. Scaphoid intact.
IMPRESSION: Impacted, intra-articular distal radius fracture

## 2020-07-14 IMAGING — DX DG ELBOW COMPLETE 3+V*L*
3 series · 3 of 3 positions shown · non-contrast
Comparison: 08/02/2016

CLINICAL DATA: Fall with pain.

EXAM:
LEFT ELBOW - COMPLETE 3+ VIEW

[elbow ap]
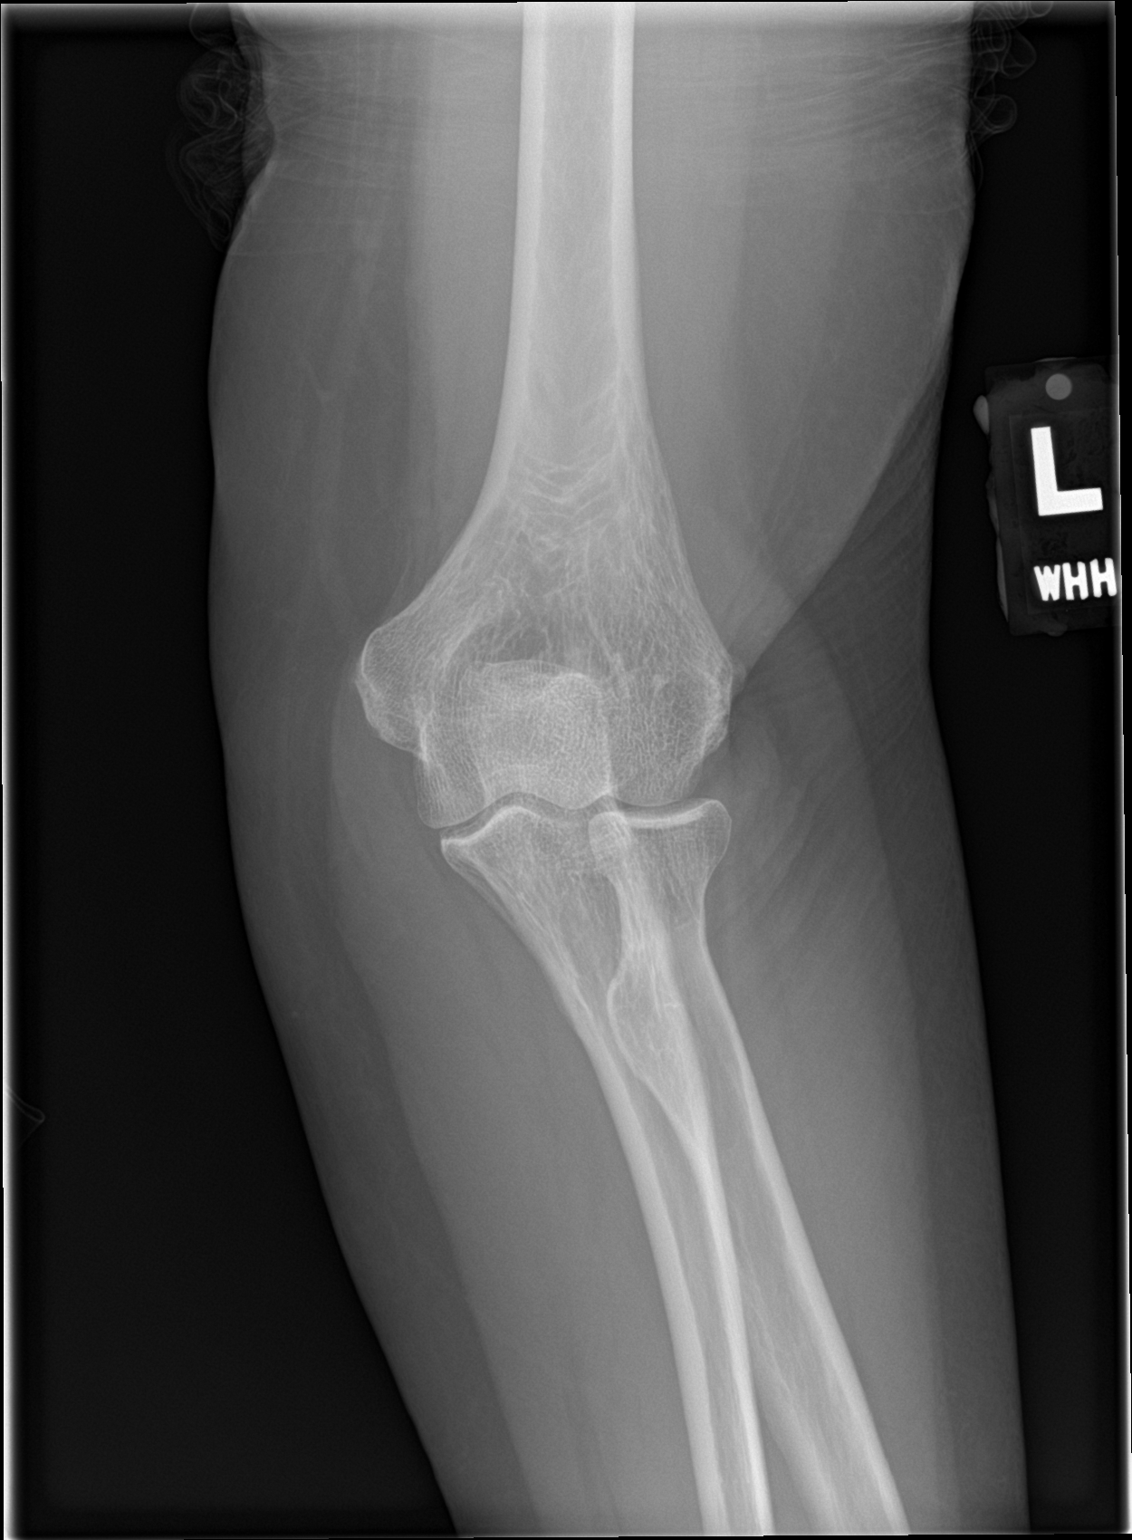

[elbow obl]
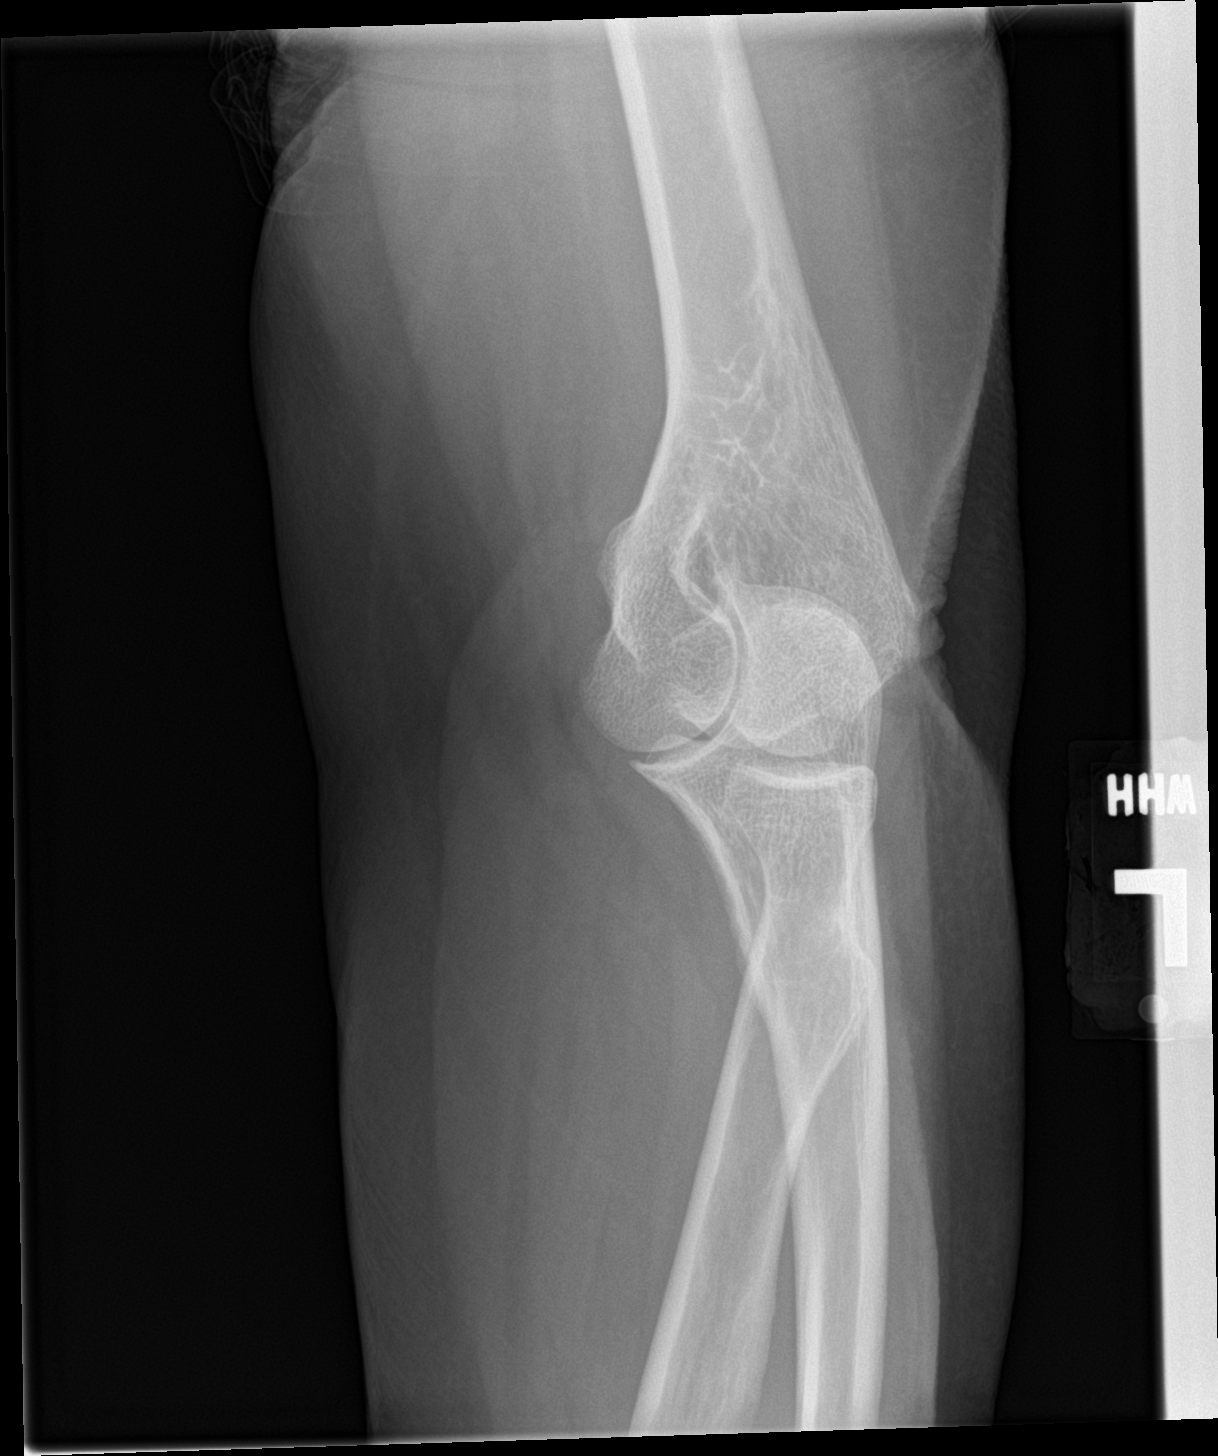

[elbow lat]
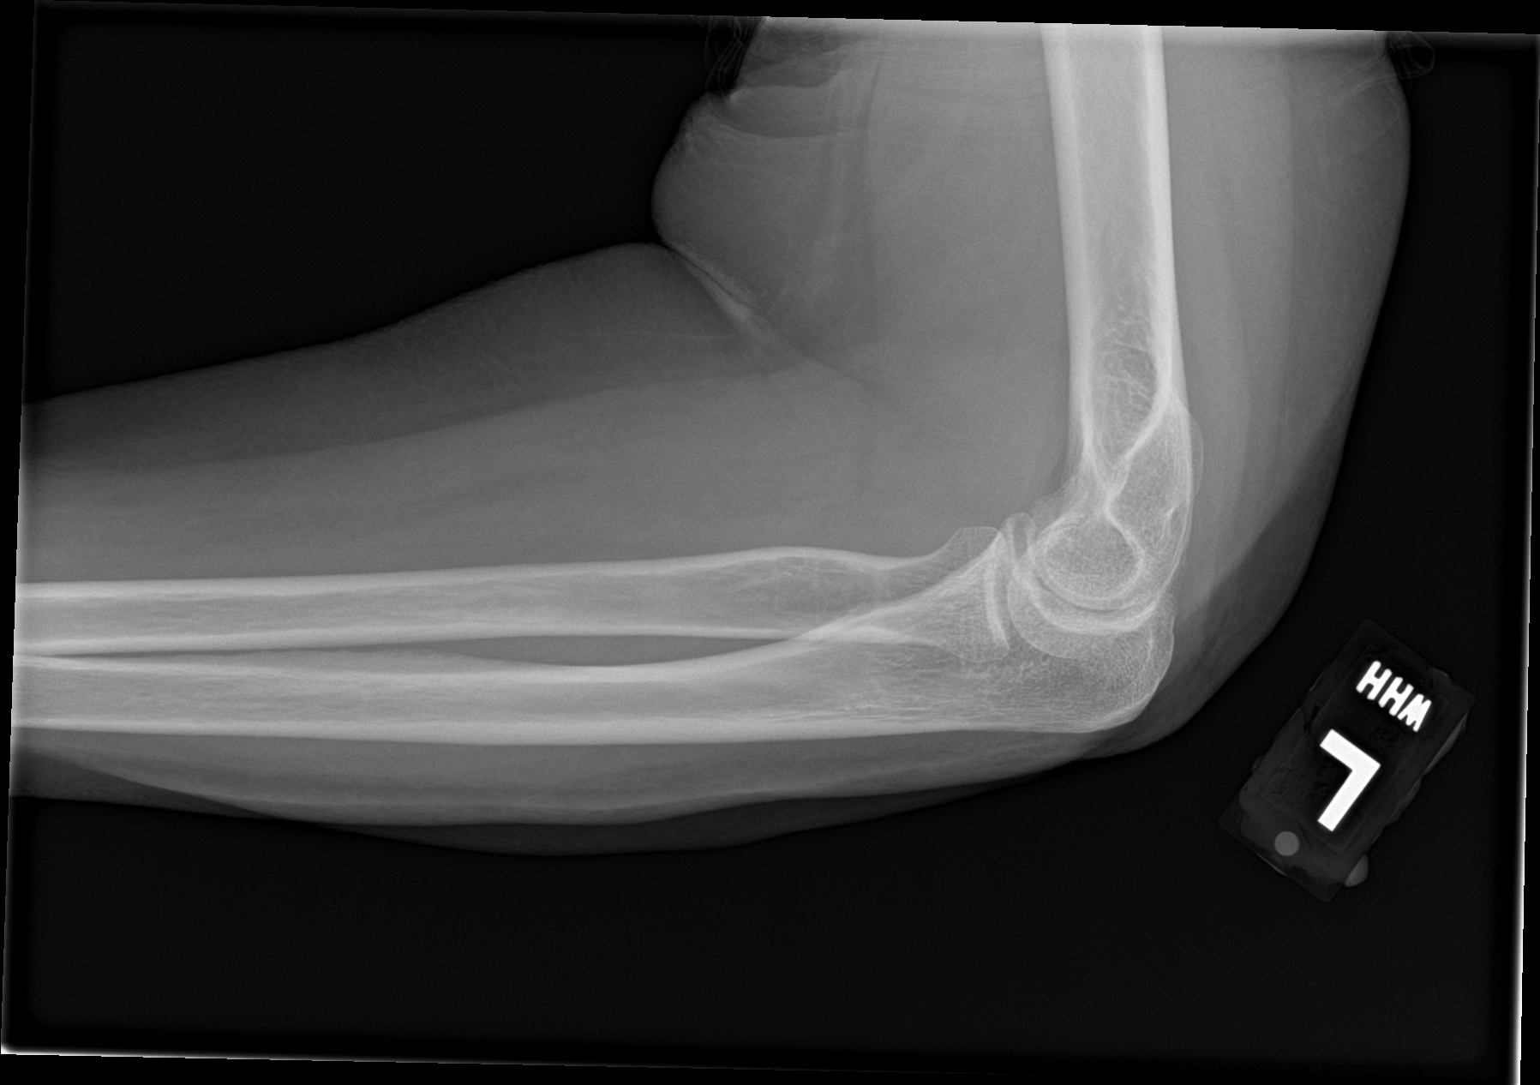

[3 of 3 positions shown; findings below may reference images not displayed]

FINDINGS: No acute fracture or dislocation.  No joint effusion.
IMPRESSION: No acute osseous abnormality.

## 2020-08-11 DIAGNOSIS — I10 Essential (primary) hypertension: Secondary | ICD-10-CM | POA: Diagnosis not present

## 2020-08-11 DIAGNOSIS — K219 Gastro-esophageal reflux disease without esophagitis: Secondary | ICD-10-CM | POA: Diagnosis not present

## 2020-08-24 DIAGNOSIS — F411 Generalized anxiety disorder: Secondary | ICD-10-CM | POA: Diagnosis not present

## 2020-08-28 DIAGNOSIS — F411 Generalized anxiety disorder: Secondary | ICD-10-CM | POA: Diagnosis not present

## 2020-09-10 DIAGNOSIS — K219 Gastro-esophageal reflux disease without esophagitis: Secondary | ICD-10-CM | POA: Diagnosis not present

## 2020-09-10 DIAGNOSIS — I10 Essential (primary) hypertension: Secondary | ICD-10-CM | POA: Diagnosis not present

## 2020-10-11 DIAGNOSIS — K219 Gastro-esophageal reflux disease without esophagitis: Secondary | ICD-10-CM | POA: Diagnosis not present

## 2020-10-11 DIAGNOSIS — I10 Essential (primary) hypertension: Secondary | ICD-10-CM | POA: Diagnosis not present

## 2020-11-18 DIAGNOSIS — Z23 Encounter for immunization: Secondary | ICD-10-CM | POA: Diagnosis not present

## 2020-11-18 DIAGNOSIS — Z1331 Encounter for screening for depression: Secondary | ICD-10-CM | POA: Diagnosis not present

## 2020-11-18 DIAGNOSIS — Z0001 Encounter for general adult medical examination with abnormal findings: Secondary | ICD-10-CM | POA: Diagnosis not present

## 2020-11-18 DIAGNOSIS — I10 Essential (primary) hypertension: Secondary | ICD-10-CM | POA: Diagnosis not present

## 2020-11-18 DIAGNOSIS — J4520 Mild intermittent asthma, uncomplicated: Secondary | ICD-10-CM | POA: Diagnosis not present

## 2020-11-18 DIAGNOSIS — K219 Gastro-esophageal reflux disease without esophagitis: Secondary | ICD-10-CM | POA: Diagnosis not present

## 2020-11-18 DIAGNOSIS — Z1389 Encounter for screening for other disorder: Secondary | ICD-10-CM | POA: Diagnosis not present

## 2020-12-14 DIAGNOSIS — R110 Nausea: Secondary | ICD-10-CM | POA: Diagnosis not present

## 2020-12-14 DIAGNOSIS — R531 Weakness: Secondary | ICD-10-CM | POA: Diagnosis not present

## 2020-12-21 DIAGNOSIS — I10 Essential (primary) hypertension: Secondary | ICD-10-CM | POA: Diagnosis not present

## 2020-12-21 DIAGNOSIS — K219 Gastro-esophageal reflux disease without esophagitis: Secondary | ICD-10-CM | POA: Diagnosis not present

## 2020-12-22 DIAGNOSIS — I10 Essential (primary) hypertension: Secondary | ICD-10-CM | POA: Diagnosis not present

## 2020-12-22 DIAGNOSIS — E785 Hyperlipidemia, unspecified: Secondary | ICD-10-CM | POA: Diagnosis not present

## 2021-01-06 ENCOUNTER — Other Ambulatory Visit (HOSPITAL_COMMUNITY): Payer: Self-pay | Admitting: Internal Medicine

## 2021-01-06 DIAGNOSIS — Z1231 Encounter for screening mammogram for malignant neoplasm of breast: Secondary | ICD-10-CM

## 2021-01-13 ENCOUNTER — Ambulatory Visit (HOSPITAL_COMMUNITY): Payer: Medicare HMO

## 2021-01-21 DIAGNOSIS — I10 Essential (primary) hypertension: Secondary | ICD-10-CM | POA: Diagnosis not present

## 2021-01-21 DIAGNOSIS — J4520 Mild intermittent asthma, uncomplicated: Secondary | ICD-10-CM | POA: Diagnosis not present

## 2021-02-20 DIAGNOSIS — I10 Essential (primary) hypertension: Secondary | ICD-10-CM | POA: Diagnosis not present

## 2021-02-20 DIAGNOSIS — K219 Gastro-esophageal reflux disease without esophagitis: Secondary | ICD-10-CM | POA: Diagnosis not present

## 2021-03-07 ENCOUNTER — Other Ambulatory Visit: Payer: Self-pay

## 2021-03-07 ENCOUNTER — Emergency Department (HOSPITAL_COMMUNITY)
Admission: EM | Admit: 2021-03-07 | Discharge: 2021-03-07 | Disposition: A | Discharge status: Home / Self Care | Payer: Medicare HMO | Attending: Emergency Medicine | Admitting: Emergency Medicine

## 2021-03-07 DIAGNOSIS — N7689 Other specified inflammation of vagina and vulva: Secondary | ICD-10-CM | POA: Insufficient documentation

## 2021-03-07 DIAGNOSIS — N898 Other specified noninflammatory disorders of vagina: Secondary | ICD-10-CM

## 2021-03-07 DIAGNOSIS — R300 Dysuria: Secondary | ICD-10-CM | POA: Insufficient documentation

## 2021-03-07 DIAGNOSIS — I10 Essential (primary) hypertension: Secondary | ICD-10-CM | POA: Diagnosis not present

## 2021-03-07 DIAGNOSIS — B3731 Acute candidiasis of vulva and vagina: Secondary | ICD-10-CM | POA: Diagnosis not present

## 2021-03-07 LAB — URINALYSIS, ROUTINE W REFLEX MICROSCOPIC
Bacteria, UA: NONE SEEN
Bilirubin Urine: NEGATIVE
Glucose, UA: NEGATIVE mg/dL
Hgb urine dipstick: NEGATIVE
Ketones, ur: NEGATIVE mg/dL
Nitrite: NEGATIVE
Protein, ur: NEGATIVE mg/dL
Specific Gravity, Urine: 1.002 — ABNORMAL LOW (ref 1.005–1.030)
pH: 6 (ref 5.0–8.0)

## 2021-03-07 MED ORDER — CLOTRIMAZOLE 1 % VA CREA: 1.0000 | TOPICAL_CREAM | Freq: Every day | VAGINAL | 0 refills | Status: DC

## 2021-03-07 NOTE — ED Triage Notes (Signed)
Pt arrives with c/o dizziness, chills, nausea, and burning when she urinates for about a week.

## 2021-03-07 NOTE — ED Provider Notes (Signed)
[facility] EMERGENCY DEPARTMENT Provider Note   CSN: [id number] Arrival date & time: 03/07/21  1807     History  Chief Complaint  Patient presents with   Multiple Complaints    Emily Cooke is a 71 y.o. female.  Pt reports she has had vaginal irritation.  Pt reports she ran out of lotrimin for yeast infection.  Pt reports she has had burning when she urinates and wants to make sure she does not have a uti.  Pt states no std risk.  Pt also wants her ears checked   The history is provided by the patient. No language interpreter was used.  Dysuria Pain quality:  Aching Pain severity:  Mild Onset quality:  Gradual Duration:  1 week Timing:  Constant Progression:  Unable to specify Chronicity:  New Relieved by:  Nothing Worsened by:  Nothing Ineffective treatments:  None tried Urinary symptoms: no discolored urine and no foul-smelling urine   Risk factors: no recurrent urinary tract infections       Home Medications Prior to Admission medications   Medication Sig Start Date End Date Taking? Authorizing Provider  clotrimazole (GYNE-LOTRIMIN) 1 % vaginal cream Place 1 Applicatorful vaginally at bedtime. 03/07/21  Yes Fransico Meadow, PA-C  acetaminophen (TYLENOL) 500 MG tablet Take 1 tablet (500 mg total) by mouth every 6 (six) hours as needed for moderate pain. 06/20/19   Corena Herter, PA-C  amLODipine (NORVASC) 2.5 MG tablet  12/26/19   [provider]  clindamycin (CLEOCIN) 150 MG capsule Take 3 capsules (450 mg total) by mouth 3 (three) times daily. 06/20/19   Corena Herter, PA-C  cyclobenzaprine (FLEXERIL) 10 MG tablet Take 1 tablet (10 mg total) by mouth 2 (two) times daily as needed for muscle spasms. 03/25/18   [name], MD  DEXILANT 60 MG capsule Take 60 mg by mouth daily.  05/31/16   [provider]  dicyclomine (BENTYL) 20 MG tablet Take 1 tablet (20 mg total) by mouth 3 (three) times daily as needed (abdominal cramping). 02/15/18    Mesner, Corene Cornea, MD  HYDROcodone-acetaminophen (NORCO/VICODIN) 5-325 MG tablet Take 1 tablet by mouth every 4 (four) hours as needed for moderate pain. 04/23/20 04/23/21  Fransico Meadow, PA-C  hydrocortisone 2.5 % cream Apply topically 2 (two) times daily as needed (Rash). 01/28/20   Brendolyn Patty, MD  hydrOXYzine (ATARAX/VISTARIL) 25 MG tablet Take 1 tablet by mouth three times daily as needed anxiety. 01/12/15   [provider]  lisinopril (PRINIVIL,ZESTRIL) 5 MG tablet Take 0.5 tablets (2.5 mg total) by mouth daily. 04/09/14   [name], MD  meclizine (ANTIVERT) 25 MG tablet TK 1 T PO TID PRN 05/01/18   [provider]  meloxicam (MOBIC) 15 MG tablet  04/17/18   [provider]  pantoprazole (PROTONIX) 40 MG tablet TK 1 T PO QD 05/21/18   [provider]  propranolol (INDERAL) 10 MG tablet Take 10 mg by mouth at bedtime.      [provider]  VENTOLIN HFA 108 [phone] Base) MCG/ACT inhaler  07/23/18   [provider]      Allergies    Cephalexin, Ibuprofen, Amoxicillin, Bactrim [sulfamethoxazole-trimethoprim], Ciprofloxacin, Imitrex [sumatriptan succinate], Morphine and related, Tetracyclines & related, and Zoloft [sertraline hcl]    Review of Systems   Review of Systems  Genitourinary:  Positive for dysuria.  All other systems reviewed and are negative.  Physical Exam Updated Vital Signs BP (!) 150/62    Pulse 63  Temp 97.9 F (36.6 C) (Oral)    Resp 16    Wt 128.8 kg    SpO2 97%    BMI 43.18 kg/m  Physical Exam Vitals and nursing note reviewed.  Constitutional:      Appearance: She is well-developed.  HENT:     Head: Normocephalic.     Right Ear: Tympanic membrane normal.     Left Ear: Tympanic membrane normal.     Mouth/Throat:     Mouth: Mucous membranes are moist.  Cardiovascular:     Rate and Rhythm: Normal rate.  Pulmonary:     Effort: Pulmonary effort is normal.  Abdominal:     General: Abdomen is flat. There is no  distension.  Musculoskeletal:        General: Normal range of motion.     Cervical back: Normal range of motion.  Skin:    General: Skin is warm.  Neurological:     Mental Status: She is alert and oriented to person, place, and time.    ED Results / Procedures / Treatments   Labs (all labs ordered are listed, but only abnormal results are displayed) Labs Reviewed  URINALYSIS, ROUTINE W REFLEX MICROSCOPIC - Abnormal; Notable for the following components:      Result Value   Color, Urine STRAW (*)    Specific Gravity, Urine 1.002 (*)    Leukocytes,Ua SMALL (*)    Non Squamous Epithelial 0-5 (*)    All other components within normal limits    EKG EKG Interpretation  Date/Time:  Sunday March 07 2021 18:41:02 EST Ventricular Rate:  80 PR Interval:  168 QRS Duration: 82 QT Interval:  382 QTC Calculation: 440 R Axis:   20 Text Interpretation: Normal sinus rhythm Normal ECG When compared with ECG of 23-Apr-2020 14:49, No significant change was found Confirmed by Miller, Brian ([id number]) on 03/07/2021 6:43:43 PM  Radiology No results found.  Procedures Procedures    Medications Ordered in ED Medications - No data to display  ED Course/ Medical Decision Making/ A&P                           Medical Decision Making  MDM:  Urine ordered, reviewed and interpreted.  Pt has small leukocytes  Pt declined std testing/pelvic.  Pt request rx for vaginal cream.  She reports this works better than diflucan         Final Clinical Impression(s) / ED Diagnoses Final diagnoses:  Vaginal irritation  Dysuria    Rx / DC Orders ED Discharge Orders          Ordered    clotrimazole (GYNE-LOTRIMIN) 1 % vaginal cream  Daily at bedtime        01 /01/23 2034           An After Visit Summary was printed and given to the patient.    Sidney Ace 03/07/21 2216    Noemi Chapel, MD 03/11/21 [phone]

## 2021-03-07 NOTE — ED Notes (Signed)
Pt d/c home per MD order. Discharge summary reviewed, verbalizes understanding. Ambulatory off unit. No s/s of acute distress noted at discharge.

## 2021-03-17 DIAGNOSIS — Z0100 Encounter for examination of eyes and vision without abnormal findings: Secondary | ICD-10-CM | POA: Diagnosis not present

## 2021-03-17 DIAGNOSIS — H5200 Hypermetropia, unspecified eye: Secondary | ICD-10-CM | POA: Diagnosis not present

## 2021-03-23 DIAGNOSIS — K219 Gastro-esophageal reflux disease without esophagitis: Secondary | ICD-10-CM | POA: Diagnosis not present

## 2021-03-23 DIAGNOSIS — I10 Essential (primary) hypertension: Secondary | ICD-10-CM | POA: Diagnosis not present

## 2021-04-06 DIAGNOSIS — H6692 Otitis media, unspecified, left ear: Secondary | ICD-10-CM | POA: Diagnosis not present

## 2021-04-06 DIAGNOSIS — F419 Anxiety disorder, unspecified: Secondary | ICD-10-CM | POA: Diagnosis not present

## 2021-04-06 DIAGNOSIS — I10 Essential (primary) hypertension: Secondary | ICD-10-CM | POA: Diagnosis not present

## 2021-04-15 ENCOUNTER — Ambulatory Visit (HOSPITAL_COMMUNITY)
Admission: RE | Admit: 2021-04-15 | Discharge: 2021-04-15 | Disposition: A | Discharge status: Home / Self Care | Payer: Medicare HMO | Source: Ambulatory Visit | Attending: Internal Medicine | Admitting: Internal Medicine

## 2021-04-15 ENCOUNTER — Other Ambulatory Visit: Payer: Self-pay

## 2021-04-15 DIAGNOSIS — Z1231 Encounter for screening mammogram for malignant neoplasm of breast: Secondary | ICD-10-CM | POA: Insufficient documentation

## 2021-04-17 ENCOUNTER — Encounter (HOSPITAL_COMMUNITY): Payer: Self-pay | Admitting: Emergency Medicine

## 2021-04-17 ENCOUNTER — Other Ambulatory Visit: Payer: Self-pay

## 2021-04-17 ENCOUNTER — Emergency Department (HOSPITAL_COMMUNITY)
Admission: EM | Admit: 2021-04-17 | Discharge: 2021-04-17 | Disposition: A | Discharge status: Home / Self Care | Payer: Medicare HMO | Attending: Emergency Medicine | Admitting: Emergency Medicine

## 2021-04-17 DIAGNOSIS — Z79899 Other long term (current) drug therapy: Secondary | ICD-10-CM | POA: Diagnosis not present

## 2021-04-17 DIAGNOSIS — H9202 Otalgia, left ear: Secondary | ICD-10-CM

## 2021-04-17 DIAGNOSIS — I10 Essential (primary) hypertension: Secondary | ICD-10-CM | POA: Diagnosis not present

## 2021-04-17 NOTE — ED Provider Notes (Signed)
[facility] Provider Note   CSN: [id number] Arrival date & time: 04/17/21  0732     History  Chief Complaint  Patient presents with   Ear Pain    Emily Cooke is a 71 y.o. female.  Patient with a complaint of left ear pain for 2 weeks.  Completed a course of azithromycin a week ago.  Still having pain.  Describes it as an ache.  Not made worse by chewing.  No rash.  Patient has had shingles in the past.  Reminds her little bit of that.  At this time there is been no rash.   Past medical history significant for hypertension migraines anxiety panic disorder.      Home Medications Prior to Admission medications   Medication Sig Start Date End Date Taking? Authorizing Provider  acetaminophen (TYLENOL) 500 MG tablet Take 1 tablet (500 mg total) by mouth every 6 (six) hours as needed for moderate pain. 06/20/19   Corena Herter, PA-C  amLODipine (NORVASC) 2.5 MG tablet  12/26/19   [provider]  clindamycin (CLEOCIN) 150 MG capsule Take 3 capsules (450 mg total) by mouth 3 (three) times daily. 06/20/19   Corena Herter, PA-C  clotrimazole (GYNE-LOTRIMIN) 1 % vaginal cream Place 1 Applicatorful vaginally at bedtime. 03/07/21   Fransico Meadow, PA-C  cyclobenzaprine (FLEXERIL) 10 MG tablet Take 1 tablet (10 mg total) by mouth 2 (two) times daily as needed for muscle spasms. 03/25/18   [name], MD  DEXILANT 60 MG capsule Take 60 mg by mouth daily.  05/31/16   [provider]  dicyclomine (BENTYL) 20 MG tablet Take 1 tablet (20 mg total) by mouth 3 (three) times daily as needed (abdominal cramping). 02/15/18   Mesner, Corene Cornea, MD  HYDROcodone-acetaminophen (NORCO/VICODIN) 5-325 MG tablet Take 1 tablet by mouth every 4 (four) hours as needed for moderate pain. 04/23/20 04/23/21  Fransico Meadow, PA-C  hydrocortisone 2.5 % cream Apply topically 2 (two) times daily as needed (Rash). 01/28/20   Brendolyn Patty, MD  hydrOXYzine (ATARAX/VISTARIL) 25  MG tablet Take 1 tablet by mouth three times daily as needed anxiety. 01/12/15   [provider]  lisinopril (PRINIVIL,ZESTRIL) 5 MG tablet Take 0.5 tablets (2.5 mg total) by mouth daily. 04/09/14   [name], MD  meclizine (ANTIVERT) 25 MG tablet TK 1 T PO TID PRN 05/01/18   [provider]  meloxicam (MOBIC) 15 MG tablet  04/17/18   [provider]  pantoprazole (PROTONIX) 40 MG tablet TK 1 T PO QD 05/21/18   [provider]  propranolol (INDERAL) 10 MG tablet Take 10 mg by mouth at bedtime.      [provider]  VENTOLIN HFA 108 [phone] Base) MCG/ACT inhaler  07/23/18   [provider]      Allergies    Cephalexin, Ibuprofen, Amoxicillin, Bactrim [sulfamethoxazole-trimethoprim], Ciprofloxacin, Imitrex [sumatriptan succinate], Morphine and related, Tetracyclines & related, and Zoloft [sertraline hcl]    Review of Systems   Review of Systems  Constitutional:  Negative for chills and fever.  HENT:  Positive for ear pain. Negative for sore throat.   Eyes:  Negative for pain and visual disturbance.  Respiratory:  Negative for cough and shortness of breath.   Cardiovascular:  Negative for chest pain and palpitations.  Gastrointestinal:  Negative for abdominal pain and vomiting.  Genitourinary:  Negative for dysuria and hematuria.  Musculoskeletal:  Negative for arthralgias and back pain.  Skin:  Negative for  color change and rash.  Neurological:  Negative for seizures and syncope.  All other systems reviewed and are negative.  Physical Exam Updated Vital Signs BP (!) 151/71 (BP Location: Right Arm)    Pulse 63    Temp 97.7 F (36.5 C) (Oral)    Resp 18    Ht 1.727 m (5\' 8" )    Wt 101.6 kg    SpO2 100%    BMI 34.06 kg/m  Physical Exam Vitals and nursing note reviewed.  Constitutional:      General: She is not in acute distress.    Appearance: Normal appearance. She is well-developed.  HENT:     Head: Normocephalic and atraumatic.      Right Ear: Tympanic membrane, ear canal and external ear normal.     Left Ear: Tympanic membrane, ear canal and external ear normal.     Ears:     Comments: Both tympanic membranes normal.  Ear canals normal.  Little bit of soft wax in each 1.  We can see the tympanic membrane without difficulty.  Appears very normal.  No mastoid tenderness.  No pinna tenderness.  No erythema no rash no vesicles Eyes:     Extraocular Movements: Extraocular movements intact.     Conjunctiva/sclera: Conjunctivae normal.     Pupils: Pupils are equal, round, and reactive to light.  Cardiovascular:     Rate and Rhythm: Normal rate and regular rhythm.     Heart sounds: No murmur heard. Pulmonary:     Effort: Pulmonary effort is normal. No respiratory distress.     Breath sounds: Normal breath sounds.  Abdominal:     Palpations: Abdomen is soft.     Tenderness: There is no abdominal tenderness.  Musculoskeletal:        General: No swelling.     Cervical back: Neck supple.  Skin:    General: Skin is warm and dry.     Capillary Refill: Capillary refill takes less than 2 seconds.  Neurological:     Mental Status: She is alert.  Psychiatric:        Mood and Affect: Mood normal.    ED Results / Procedures / Treatments   Labs (all labs ordered are listed, but only abnormal results are displayed) Labs Reviewed - No data to display  EKG None  Radiology No results found.  Procedures Procedures    Medications Ordered in ED Medications - No data to display  ED Course/ Medical Decision Making/ A&P                           Medical Decision Making  Patient with persistent ear pain.  Without evidence of infection.  No evidence otitis media or otitis externa.  No evidence of any rash to be consistent with shingles.  Does not seem to fit in the TMJ.  Patient nontoxic no acute distress.  Patient has not tried Motrin for the discomfort.  Patient states her primary care doctor referred her to ear nose and  throat but she never got a formal referral.  Now she switched primary care doctors because she does not like that 1.  We will give patient referral to ear nose and throat for further evaluation.   Final Clinical Impression(s) / ED Diagnoses Final diagnoses:  Ear pain, left    Rx / DC Orders ED Discharge Orders     None         Fredia Sorrow, MD  04/17/21 0848 ° °

## 2021-04-17 NOTE — Discharge Instructions (Addendum)
Recommend a trial of Motrin.  Make an appointment to follow-up with ear nose and throat.  Dr. Lorelee Cover is on call today call his office on Monday.  As we discussed no signs of ear infection at all on the left side.

## 2021-04-17 NOTE — ED Triage Notes (Signed)
Patient c/o left ear pain x2 weeks. Per patient seen at PCP for ear pain and was told she had ear infection and was given Z-pack with no relief. Patient states she tried "debrox swimmers ear drops" with some relief and called PCP again but was instructed to go to ENT. Per patient switched doctor and has not been able to get an appointment yet. Denies any drainage or fever.

## 2021-06-17 ENCOUNTER — Emergency Department (HOSPITAL_COMMUNITY)
Admission: EM | Admit: 2021-06-17 | Discharge: 2021-06-18 | Disposition: A | Discharge status: Home / Self Care | Payer: Medicare HMO | Attending: Emergency Medicine | Admitting: Emergency Medicine

## 2021-06-17 ENCOUNTER — Other Ambulatory Visit: Payer: Self-pay

## 2021-06-17 ENCOUNTER — Encounter (HOSPITAL_COMMUNITY): Payer: Self-pay | Admitting: Emergency Medicine

## 2021-06-17 DIAGNOSIS — R197 Diarrhea, unspecified: Secondary | ICD-10-CM | POA: Diagnosis present

## 2021-06-17 DIAGNOSIS — Z79899 Other long term (current) drug therapy: Secondary | ICD-10-CM | POA: Insufficient documentation

## 2021-06-17 DIAGNOSIS — R6883 Chills (without fever): Secondary | ICD-10-CM | POA: Diagnosis not present

## 2021-06-17 DIAGNOSIS — I10 Essential (primary) hypertension: Secondary | ICD-10-CM | POA: Diagnosis not present

## 2021-06-17 LAB — CBC WITH DIFFERENTIAL/PLATELET
Abs Immature Granulocytes: 0.03 10*3/uL (ref 0.00–0.07)
Basophils Absolute: 0 10*3/uL (ref 0.0–0.1)
Basophils Relative: 0 %
Eosinophils Absolute: 0.2 10*3/uL (ref 0.0–0.5)
Eosinophils Relative: 3 %
HCT: 41.9 % (ref 36.0–46.0)
Hemoglobin: 13.7 g/dL (ref 12.0–15.0)
Immature Granulocytes: 0 %
Lymphocytes Relative: 18 %
Lymphs Abs: 1.5 10*3/uL (ref 0.7–4.0)
MCH: 30.1 pg (ref 26.0–34.0)
MCHC: 32.7 g/dL (ref 30.0–36.0)
MCV: 92.1 fL (ref 80.0–100.0)
Monocytes Absolute: 0.5 10*3/uL (ref 0.1–1.0)
Monocytes Relative: 6 %
Neutro Abs: 5.8 10*3/uL (ref 1.7–7.7)
Neutrophils Relative %: 73 %
Platelets: 216 10*3/uL (ref 150–400)
RBC: 4.55 MIL/uL (ref 3.87–5.11)
RDW: 12.6 % (ref 11.5–15.5)
WBC: 8 10*3/uL (ref 4.0–10.5)
nRBC: 0 % (ref 0.0–0.2)

## 2021-06-17 MED ORDER — LACTATED RINGERS IV BOLUS
1000.0000 mL | Freq: Once | INTRAVENOUS | Status: AC
  Administered 2021-06-18: 1000 mL via INTRAVENOUS

## 2021-06-17 MED ORDER — LOPERAMIDE HCL 2 MG PO CAPS
4.0000 mg | ORAL_CAPSULE | Freq: Once | ORAL | Status: AC
  Administered 2021-06-18: 4 mg via ORAL
  Filled 2021-06-17: qty 2

## 2021-06-17 NOTE — ED Provider Notes (Signed)
?[facility] ?Provider Note ? ? ?CSN: [id number] ?Arrival date & time: 06/17/21  2212 ? ?  ? ?History ? ?Chief Complaint  ?Patient presents with  ? Diarrhea  ? ? ?Emily Cooke is a 71 y.o. female. ? ?The history is provided by the patient.  ?Diarrhea ?She has history of hypertension and comes in because of diarrhea all day.  She estimates more than 20 loose bowel movements during the course of the day.  She denies any abdominal pain.  There has been no nausea or vomiting.  She denies fever but has had some mild chills.  She did have a sick contact with similar illness.  She has not done anything at home to treat her symptoms.  She denies any recent antibiotics or recent hospitalizations. ?  ?Home Medications ?Prior to Admission medications   ?Medication Sig Start Date End Date Taking? Authorizing Provider  ?acetaminophen (TYLENOL) 500 MG tablet Take 1 tablet (500 mg total) by mouth every 6 (six) hours as needed for moderate pain. 06/20/19   Corena Herter, PA-C  ?amLODipine (NORVASC) 2.5 MG tablet  12/26/19   [provider]  ?clindamycin (CLEOCIN) 150 MG capsule Take 3 capsules (450 mg total) by mouth 3 (three) times daily. 06/20/19   Corena Herter, PA-C  ?clotrimazole (GYNE-LOTRIMIN) 1 % vaginal cream Place 1 Applicatorful vaginally at bedtime. 03/07/21   Fransico Meadow, PA-C  ?cyclobenzaprine (FLEXERIL) 10 MG tablet Take 1 tablet (10 mg total) by mouth 2 (two) times daily as needed for muscle spasms. 03/25/18   [name], MD  ?DEXILANT 60 MG capsule Take 60 mg by mouth daily.  05/31/16   [provider]  ?dicyclomine (BENTYL) 20 MG tablet Take 1 tablet (20 mg total) by mouth 3 (three) times daily as needed (abdominal cramping). 02/15/18   Mesner, Corene Cornea, MD  ?hydrocortisone 2.5 % cream Apply topically 2 (two) times daily as needed (Rash). 01/28/20   Brendolyn Patty, MD  ?hydrOXYzine (ATARAX/VISTARIL) 25 MG tablet Take 1 tablet by mouth three times daily as needed  anxiety. 01/12/15   [provider]  ?lisinopril (PRINIVIL,ZESTRIL) 5 MG tablet Take 0.5 tablets (2.5 mg total) by mouth daily. 04/09/14   [name], MD  ?meclizine (ANTIVERT) 25 MG tablet TK 1 T PO TID PRN 05/01/18   [provider]  ?meloxicam (MOBIC) 15 MG tablet  04/17/18   [provider]  ?pantoprazole (PROTONIX) 40 MG tablet TK 1 T PO QD 05/21/18   [provider]  ?propranolol (INDERAL) 10 MG tablet Take 10 mg by mouth at bedtime.      [provider]  ?VENTOLIN HFA 108 [phone] Base) MCG/ACT inhaler  07/23/18   [provider]  ?   ? ?Allergies    ?Cephalexin, Ibuprofen, Amoxicillin, Bactrim [sulfamethoxazole-trimethoprim], Ciprofloxacin, Imitrex [sumatriptan succinate], Morphine and related, Tetracyclines & related, and Zoloft [sertraline hcl]   ? ?Review of Systems   ?Review of Systems  ?Gastrointestinal:  Positive for diarrhea.  ?All other systems reviewed and are negative. ? ?Physical Exam ?Updated Vital Signs ?BP (!) 165/80 (BP Location: Left Arm)   Pulse 92   Temp 98.5 ?F (36.9 ?C) (Oral)   Resp 17   SpO2 97%  ?Physical Exam ?Vitals and nursing note reviewed.  ?71 year old female, resting comfortably and in no acute distress. Vital signs are significant for elevated blood pressure. Oxygen saturation is 97%, which is normal. ?Head is normocephalic and atraumatic. PERRLA, EOMI. Oropharynx is clear. ?Neck is  nontender and supple without adenopathy or JVD. ?Back is nontender and there is no CVA tenderness. ?Lungs are clear without rales, wheezes, or rhonchi. ?Chest is nontender. ?Heart has regular rate and rhythm without murmur. ?Abdomen is soft, flat, nontender without masses or hepatosplenomegaly and peristalsis is hypoactive. ?Extremities have trace edema, full range of motion is present. ?Skin is warm and dry without rash. ?Neurologic: Mental status is normal, cranial nerves are intact, moves all extremities equally. ? ?ED Results / Procedures /  Treatments   ?Labs ?(all labs ordered are listed, but only abnormal results are displayed) ?Labs Reviewed  ?BASIC METABOLIC PANEL - Abnormal; Notable for the following components:  ?    Result Value  ? Glucose, Bld 102 (*)   ? All other components within normal limits  ?CBC WITH DIFFERENTIAL/PLATELET  ? ? ?Procedures ?Procedures  ? ? ?Medications Ordered in ED ?Medications  ?lactated ringers bolus 1,000 mL (1,000 mLs Intravenous New Bag/Given 06/18/21 0011)  ?loperamide (IMODIUM) capsule 4 mg (4 mg Oral Given 06/18/21 0002)  ? ? ?ED Course/ Medical Decision Making/ A&P ?  ?                        ?Medical Decision Making ?Amount and/or Complexity of Data Reviewed ?Labs: ordered. ? ?Risk ?Prescription drug management. ? ? ?Diarrhea which appears to be infectious.  Possible food poisoning.  No history of antibiotic exposure or hospitalization to suggest C. difficile.  We will check electrolytes and give IV fluids and oral loperamide and reassess. ? ?CBC and metabolic panel are unremarkable.  She feels much better after above-noted treatment.  She is discharged with instructions to take over-the-counter loperamide as needed. ? ?Final Clinical Impression(s) / ED Diagnoses ?Final diagnoses:  ?Diarrhea of presumed infectious origin  ?Elevated blood pressure reading with diagnosis of hypertension  ? ? ?Rx / DC Orders ?ED Discharge Orders   ? ? None  ? ?  ? ? ?  ?Delora Fuel, MD ?24/23/53 0121 ? ?

## 2021-06-17 NOTE — ED Triage Notes (Signed)
Pt states she has had diarrhea all day. Denies n/v. Denies black or bloody stools. Denies abd pain ?

## 2021-06-18 DIAGNOSIS — I10 Essential (primary) hypertension: Secondary | ICD-10-CM | POA: Diagnosis not present

## 2021-06-18 LAB — BASIC METABOLIC PANEL
Anion gap: 9 (ref 5–15)
BUN: 11 mg/dL (ref 8–23)
CO2: 25 mmol/L (ref 22–32)
Calcium: 9.1 mg/dL (ref 8.9–10.3)
Chloride: 104 mmol/L (ref 98–111)
Creatinine, Ser: 0.67 mg/dL (ref 0.44–1.00)
GFR, Estimated: >60 mL/min (ref >60)
Glucose, Bld: 102 mg/dL — ABNORMAL HIGH (ref 70–99)
Potassium: 3.8 mmol/L (ref 3.5–5.1)
Sodium: 138 mmol/L (ref 135–145)

## 2021-06-18 NOTE — Discharge Instructions (Signed)
Take loperamide (Imodium A-D) as needed for diarrhea. ?

## 2021-11-12 ENCOUNTER — Emergency Department (HOSPITAL_COMMUNITY)
Admission: EM | Admit: 2021-11-12 | Discharge: 2021-11-12 | Discharge status: Against Medical Advice | Payer: Medicare HMO | Source: Home / Self Care

## 2021-11-13 ENCOUNTER — Emergency Department (HOSPITAL_COMMUNITY)
Admission: EM | Admit: 2021-11-13 | Discharge: 2021-11-13 | Disposition: A | Discharge status: Home / Self Care | Payer: Medicare HMO | Attending: Emergency Medicine | Admitting: Emergency Medicine

## 2021-11-13 ENCOUNTER — Other Ambulatory Visit: Payer: Self-pay

## 2021-11-13 ENCOUNTER — Encounter (HOSPITAL_COMMUNITY): Payer: Self-pay | Admitting: Emergency Medicine

## 2021-11-13 DIAGNOSIS — Z79899 Other long term (current) drug therapy: Secondary | ICD-10-CM | POA: Diagnosis not present

## 2021-11-13 DIAGNOSIS — H6692 Otitis media, unspecified, left ear: Secondary | ICD-10-CM | POA: Insufficient documentation

## 2021-11-13 DIAGNOSIS — H6690 Otitis media, unspecified, unspecified ear: Secondary | ICD-10-CM

## 2021-11-13 DIAGNOSIS — R519 Headache, unspecified: Secondary | ICD-10-CM | POA: Diagnosis present

## 2021-11-13 MED ORDER — CLINDAMYCIN HCL 150 MG PO CAPS: 300.0000 mg | ORAL_CAPSULE | Freq: Three times a day (TID) | ORAL | 0 refills | Status: AC

## 2021-11-13 MED ORDER — CLINDAMYCIN HCL 150 MG PO CAPS
300.0000 mg | ORAL_CAPSULE | Freq: Once | ORAL | Status: AC
  Administered 2021-11-13: 300 mg via ORAL
  Filled 2021-11-13: qty 2

## 2021-11-13 MED ORDER — ACETAMINOPHEN 325 MG PO TABS
650.0000 mg | ORAL_TABLET | Freq: Once | ORAL | Status: AC
Start: 2021-11-13 — End: 2021-11-13
  Administered 2021-11-13: 650 mg via ORAL
  Filled 2021-11-13: qty 2

## 2021-11-13 NOTE — ED Notes (Signed)
ED Provider at bedside. 

## 2021-11-13 NOTE — ED Provider Notes (Signed)
[facility] EMERGENCY DEPARTMENT Provider Note   CSN: [id number] Arrival date & time: 11/13/21  0736     History  Chief Complaint  Patient presents with   Headache    Emily Cooke is a 71 y.o. female.  Patient is a 71 year old female with a past medical history of recurrent ear infections, migraines, and hypertension presenting to the emergency department with a left-sided headache.  She states that her pain is around her left ear and jaw and goes into her forehead.  She states that she has had the pain for the last day.  She denies any hearing changes or draining out of her ears.  She states that she does have poor dentition but denies any drainage in her mouth.  She denies any pain with chewing.  She denies any rash.  She states that she had subjective fevers and chills at home.  She denies any cough, congestion or sore throat.  She states this does not feel like her migraine headaches.  She denies any associated numbness, weakness or trauma.  The history is provided by the patient.  Headache      Home Medications Prior to Admission medications   Medication Sig Start Date End Date Taking? Authorizing Provider  clindamycin (CLEOCIN) 150 MG capsule Take 2 capsules (300 mg total) by mouth 3 (three) times daily for 5 days. 11/13/21 11/18/21 Yes Farhaan Mabee, Norman Herrlich, DO  acetaminophen (TYLENOL) 500 MG tablet Take 1 tablet (500 mg total) by mouth every 6 (six) hours as needed for moderate pain. 06/20/19   Corena Herter, PA-C  amLODipine (NORVASC) 2.5 MG tablet  12/26/19   [provider]  cyclobenzaprine (FLEXERIL) 10 MG tablet Take 1 tablet (10 mg total) by mouth 2 (two) times daily as needed for muscle spasms. 03/25/18   [name], MD  DEXILANT 60 MG capsule Take 60 mg by mouth daily.  05/31/16   [provider]  dicyclomine (BENTYL) 20 MG tablet Take 1 tablet (20 mg total) by mouth 3 (three) times daily as needed (abdominal cramping). 02/15/18   Mesner, Corene Cornea,  MD  hydrocortisone 2.5 % cream Apply topically 2 (two) times daily as needed (Rash). 01/28/20   Brendolyn Patty, MD  hydrOXYzine (ATARAX/VISTARIL) 25 MG tablet Take 1 tablet by mouth three times daily as needed anxiety. 01/12/15   [provider]  lisinopril (PRINIVIL,ZESTRIL) 5 MG tablet Take 0.5 tablets (2.5 mg total) by mouth daily. 04/09/14   [name], MD  meclizine (ANTIVERT) 25 MG tablet TK 1 T PO TID PRN 05/01/18   [provider]  meloxicam (MOBIC) 15 MG tablet  04/17/18   [provider]  pantoprazole (PROTONIX) 40 MG tablet TK 1 T PO QD 05/21/18   [provider]  propranolol (INDERAL) 10 MG tablet Take 10 mg by mouth at bedtime.      [provider]  VENTOLIN HFA 108 [phone] Base) MCG/ACT inhaler  07/23/18   [provider]      Allergies    Cephalexin, Ibuprofen, Amoxicillin, Bactrim [sulfamethoxazole-trimethoprim], Ciprofloxacin, Imitrex [sumatriptan succinate], Morphine and related, Tetracyclines & related, and Zoloft [sertraline hcl]    Review of Systems   Review of Systems  Neurological:  Positive for headaches.    Physical Exam Updated Vital Signs BP 127/76   Pulse 77   Temp 97.8 F (36.6 C) (Oral)   Resp 16   Ht '5\' 8"'$  (1.727 m)   Wt 101.6 kg   SpO2 100%   BMI 34.06  kg/m  Physical Exam Vitals and nursing note reviewed.  Constitutional:      General: She is not in acute distress.    Appearance: She is well-developed.  HENT:     Head: Normocephalic and atraumatic.     Comments: Right TM clear, no pain with pulling on the pinnae, no canal swelling Left TM erythematous with mild bulging, no purulence, no pain with pulling on the pinnae, no canal swelling No palpable cervical lymphadenopathy No dental tenderness to palpation, no palpable fluctuance within the mouth, no tongue or submandibular swelling, no swelling to the posterior oropharynx, no trismus, normal phonation, tolerating secretions  No tenderness to  palpation of bilateral temples No rashes or skin changes    Mouth/Throat:     Mouth: Mucous membranes are moist.     Pharynx: Oropharynx is clear.  Eyes:     Extraocular Movements: Extraocular movements intact.     Pupils: Pupils are equal, round, and reactive to light.  Cardiovascular:     Rate and Rhythm: Normal rate. Rhythm irregular.  Pulmonary:     Effort: Pulmonary effort is normal.     Breath sounds: Normal breath sounds.  Abdominal:     General: Bowel sounds are normal.     Palpations: Abdomen is soft.  Musculoskeletal:        General: Normal range of motion.     Cervical back: Normal range of motion and neck supple.  Skin:    General: Skin is warm and dry.     Findings: No rash.  Neurological:     Mental Status: She is alert.     Cranial Nerves: No cranial nerve deficit.     Sensory: No sensory deficit.     Motor: No weakness.     Coordination: Coordination normal.     Gait: Gait normal.  Psychiatric:        Mood and Affect: Mood normal.        Behavior: Behavior normal.     ED Results / Procedures / Treatments   Labs (all labs ordered are listed, but only abnormal results are displayed) Labs Reviewed - No data to display  EKG None  Radiology No results found.  Procedures Procedures    Medications Ordered in ED Medications  acetaminophen (TYLENOL) tablet 650 mg (has no administration in time range)  clindamycin (CLEOCIN) capsule 300 mg (has no administration in time range)    ED Course/ Medical Decision Making/ A&P                           Medical Decision Making This patient presents to the ED with chief complaint(s) of headache, left ear pain with pertinent past medical history of migraines, HTN, recurrent ear infections which further complicates the presenting complaint. The complaint involves an extensive differential diagnosis and also carries with it a high risk of complications and morbidity.    The differential diagnosis includes otitis  media, otitis externa, dental abscess, migraine unlikely as the symptoms are not consistent with patient's previous migraine headaches, unlikely to have ICH or mass effect as the patient has had no trauma, her headache is not severe, she has no vomiting or focal neurologic deficits, meningitis unlikely as the patient's afebrile here, is well-appearing has no neck pain or stiffness, GCA unlikely as the patient has no temporal tenderness to palpation and no jaw claudication, she has no rash making shingles unlikely  Additional history obtained: Additional history obtained from N/A  Records reviewed prior ED records, does not have ENT follow up in the system  ED Course and Reassessment: Upon patient's exam here, she has no focal neurologic deficits and is well-appearing making ICH, mass effect, meningitis unlikely causes of her headache.  She does have erythema and bulging of her left TM without purulence, concerning for early otitis media, she has no canal swelling or pain with pulling on the pinnae making otitis externa unlikely.  She has no tooth tenderness to palpation and no palpable fluctuance making a dental abscess unlikely.  She has no rashes or tenderness to her temple making shingles or GCA unlikely.  Patient will be treated with Tylenol.  She has multiple antibiotic allergies and will be treated with clinda for her ear infection.  Independent labs interpretation:  N/A  Independent visualization of imaging: N/A  Consultation: - Consulted or discussed management/test interpretation w/ external professional: N/A  Consideration for admission or further workup: Patient has no emergent conditions requiring admission at this time.  She is stable for discharge home with outpatient primary care follow-up.  She will be given ENT referral for her recurrent ear infections. Social Determinants of health: N/A            Final Clinical Impression(s) / ED Diagnoses Final diagnoses:  Acute  otitis media, unspecified otitis media type    Rx / DC Orders ED Discharge Orders          Ordered    clindamycin (CLEOCIN) 150 MG capsule  3 times daily        11/13/21 0904    Ambulatory referral to ENT        11/13/21 0905              Ottie Glazier, DO 11/13/21 [phone]

## 2021-11-13 NOTE — ED Triage Notes (Signed)
Pt to the ED with complaints of a headache that woke her on Friday morning. The pt states she has a history of chronic ear infections, and that she may possibly have a problem with a tooth on the left side.

## 2021-11-13 NOTE — ED Notes (Signed)
Pt verbalized understanding of discharge instructions. Opportunity for questions provided.  

## 2021-11-13 NOTE — Discharge Instructions (Signed)
You were seen in the emergency department for your headache.  You did have some redness and bulging of your eardrum, concerning for an early ear infection.  Will be treated with antibiotics and you should complete this as prescribed.  You can continue to take Tylenol as needed for pain.  You should follow-up with your primary doctor in the next few days to have your symptoms rechecked.  You can also follow-up with ENT for your recurrent ear infections.  You should return to the emergency department if your headache sitting significantly worse, you are having repetitive vomiting, you have numbness or weakness on one side of the body compared to the other, you pass out, or if you have any other new or concerning symptoms.

## 2022-03-02 ENCOUNTER — Other Ambulatory Visit (HOSPITAL_COMMUNITY): Payer: Self-pay | Admitting: Adult Health

## 2022-03-02 DIAGNOSIS — Z1231 Encounter for screening mammogram for malignant neoplasm of breast: Secondary | ICD-10-CM

## 2022-03-07 ENCOUNTER — Emergency Department (HOSPITAL_COMMUNITY)
Admission: EM | Admit: 2022-03-07 | Discharge: 2022-03-07 | Disposition: A | Discharge status: Home / Self Care | Payer: Medicare HMO | Attending: Emergency Medicine | Admitting: Emergency Medicine

## 2022-03-07 ENCOUNTER — Other Ambulatory Visit: Payer: Self-pay

## 2022-03-07 ENCOUNTER — Encounter (HOSPITAL_COMMUNITY): Payer: Self-pay

## 2022-03-07 ENCOUNTER — Emergency Department (HOSPITAL_COMMUNITY): Payer: Medicare HMO

## 2022-03-07 DIAGNOSIS — Z87891 Personal history of nicotine dependence: Secondary | ICD-10-CM | POA: Diagnosis not present

## 2022-03-07 DIAGNOSIS — I10 Essential (primary) hypertension: Secondary | ICD-10-CM | POA: Diagnosis not present

## 2022-03-07 DIAGNOSIS — Z79899 Other long term (current) drug therapy: Secondary | ICD-10-CM | POA: Diagnosis not present

## 2022-03-07 DIAGNOSIS — M25511 Pain in right shoulder: Secondary | ICD-10-CM | POA: Diagnosis present

## 2022-03-07 LAB — BASIC METABOLIC PANEL
Anion gap: 8 (ref 5–15)
BUN: 13 mg/dL (ref 8–23)
CO2: 27 mmol/L (ref 22–32)
Calcium: 9.4 mg/dL (ref 8.9–10.3)
Chloride: 105 mmol/L (ref 98–111)
Creatinine, Ser: 0.67 mg/dL (ref 0.44–1.00)
GFR, Estimated: >60 mL/min (ref >60)
Glucose, Bld: 105 mg/dL — ABNORMAL HIGH (ref 70–99)
Potassium: 4.3 mmol/L (ref 3.5–5.1)
Sodium: 140 mmol/L (ref 135–145)

## 2022-03-07 LAB — CBC WITH DIFFERENTIAL/PLATELET
Abs Immature Granulocytes: 0.01 10*3/uL (ref 0.00–0.07)
Basophils Absolute: 0 10*3/uL (ref 0.0–0.1)
Basophils Relative: 1 %
Eosinophils Absolute: 0.1 10*3/uL (ref 0.0–0.5)
Eosinophils Relative: 2 %
HCT: 41.2 % (ref 36.0–46.0)
Hemoglobin: 13.4 g/dL (ref 12.0–15.0)
Immature Granulocytes: 0 %
Lymphocytes Relative: 42 %
Lymphs Abs: 2.2 10*3/uL (ref 0.7–4.0)
MCH: 29.7 pg (ref 26.0–34.0)
MCHC: 32.5 g/dL (ref 30.0–36.0)
MCV: 91.4 fL (ref 80.0–100.0)
Monocytes Absolute: 0.4 10*3/uL (ref 0.1–1.0)
Monocytes Relative: 7 %
Neutro Abs: 2.5 10*3/uL (ref 1.7–7.7)
Neutrophils Relative %: 48 %
Platelets: 196 10*3/uL (ref 150–400)
RBC: 4.51 MIL/uL (ref 3.87–5.11)
RDW: 12.9 % (ref 11.5–15.5)
WBC: 5.1 10*3/uL (ref 4.0–10.5)
nRBC: 0 % (ref 0.0–0.2)

## 2022-03-07 MED ORDER — ACETAMINOPHEN 500 MG PO TABS
1000.0000 mg | ORAL_TABLET | Freq: Once | ORAL | Status: AC
Start: 2022-03-07 — End: 2022-03-07
  Administered 2022-03-07: 1000 mg via ORAL
  Filled 2022-03-07: qty 2

## 2022-03-07 NOTE — ED Provider Notes (Signed)
[facility] EMERGENCY DEPARTMENT Provider Note  CSN: [id number] Arrival date & time: 03/07/22 2778  Chief Complaint(s) Shoulder Pain  HPI Emily Cooke is a 72 y.o. female with a history of hypertension, GERD presenting to the emergency department with shoulder pain.  She reports right shoulder pain for 3 days.  She reports that the pain is primarily located where her clavicle meets her sternum.  No fevers or chills, night sweats.  No trauma.  No nausea, vomiting.  Pain is mild.  She has not taken anything for the pain.   Past Medical History Past Medical History:  Diagnosis Date   Anxiety    Arthritis    GERD (gastroesophageal reflux disease)    Hypertension    Migraine    Panic attack    Panic disorder    UTI (lower urinary tract infection)    Vertigo    There are no problems to display for this patient.  Home Medication(s) Prior to Admission medications   Medication Sig Start Date End Date Taking? Authorizing Provider  acetaminophen (TYLENOL) 500 MG tablet Take 1 tablet (500 mg total) by mouth every 6 (six) hours as needed for moderate pain. 06/20/19   Corena Herter, PA-C  amLODipine (NORVASC) 2.5 MG tablet  12/26/19   [provider]  cyclobenzaprine (FLEXERIL) 10 MG tablet Take 1 tablet (10 mg total) by mouth 2 (two) times daily as needed for muscle spasms. 03/25/18   [name], MD  DEXILANT 60 MG capsule Take 60 mg by mouth daily.  05/31/16   [provider]  dicyclomine (BENTYL) 20 MG tablet Take 1 tablet (20 mg total) by mouth 3 (three) times daily as needed (abdominal cramping). 02/15/18   Mesner, Corene Cornea, MD  hydrocortisone 2.5 % cream Apply topically 2 (two) times daily as needed (Rash). 01/28/20   Brendolyn Patty, MD  hydrOXYzine (ATARAX/VISTARIL) 25 MG tablet Take 1 tablet by mouth three times daily as needed anxiety. 01/12/15   [provider]  lisinopril (PRINIVIL,ZESTRIL) 5 MG tablet Take 0.5 tablets (2.5 mg total) by mouth daily.  04/09/14   [name], MD  meclizine (ANTIVERT) 25 MG tablet TK 1 T PO TID PRN 05/01/18   [provider]  meloxicam (MOBIC) 15 MG tablet  04/17/18   [provider]  pantoprazole (PROTONIX) 40 MG tablet TK 1 T PO QD 05/21/18   [provider]  propranolol (INDERAL) 10 MG tablet Take 10 mg by mouth at bedtime.      [provider]  VENTOLIN HFA 108 [id number] Base) MCG/ACT inhaler  07/23/18   [provider]                                                                                                                                    Past Surgical History Past Surgical History:  Procedure Laterality Date   ABDOMINAL HYSTERECTOMY     partial   BREAST  BIOPSY     [facility]   BREAST SURGERY     Family History Family History  Problem Relation Age of Onset   Hypertension Mother    Diabetes Father     Social History Social History   Tobacco Use   Smoking status: Former    Packs/day: 2.00    Years: 30.00    Total pack years: 60.00    Types: Cigarettes    Quit date: 04/01/2002    Years since quitting: 19.9   Smokeless tobacco: Never  Vaping Use   Vaping Use: Never used  Substance Use Topics   Alcohol use: No   Drug use: No   Allergies Cephalexin, Ibuprofen, Amoxicillin, Bactrim [sulfamethoxazole-trimethoprim], Ciprofloxacin, Imitrex [sumatriptan succinate], Morphine and related, Tetracyclines & related, and Zoloft [sertraline hcl]  Review of Systems Review of Systems  All other systems reviewed and are negative.   Physical Exam Vital Signs  I have reviewed the triage vital signs BP (!) 155/75   Pulse 73   Temp 97.7 F (36.5 C) (Oral)   Resp 18   Ht '5\' 8"'$  (1.727 m)   Wt 101.6 kg   SpO2 99%   BMI 34.06 kg/m  Physical Exam Vitals and nursing note reviewed.  Constitutional:      General: She is not in acute distress.    Appearance: She is well-developed.  HENT:     Head: Normocephalic and atraumatic.     Mouth/Throat:      Mouth: Mucous membranes are moist.  Eyes:     Pupils: Pupils are equal, round, and reactive to light.  Cardiovascular:     Rate and Rhythm: Normal rate and regular rhythm.     Heart sounds: No murmur heard. Pulmonary:     Effort: Pulmonary effort is normal. No respiratory distress.     Breath sounds: Normal breath sounds.  Abdominal:     General: Abdomen is flat.     Palpations: Abdomen is soft.     Tenderness: There is no abdominal tenderness.  Musculoskeletal:     Right lower leg: No edema.     Left lower leg: No edema.     Comments: Full right shoulder range of motion without significant pain.  Localized small area of swelling over the right [place] joint without erythema or warmth, mild tenderness to this area  Skin:    General: Skin is warm and dry.  Neurological:     General: No focal deficit present.     Mental Status: She is alert. Mental status is at baseline.  Psychiatric:        Mood and Affect: Mood normal.        Behavior: Behavior normal.     ED Results and Treatments Labs (all labs ordered are listed, but only abnormal results are displayed) Labs Reviewed  BASIC METABOLIC PANEL - Abnormal; Notable for the following components:      Result Value   Glucose, Bld 105 (*)    All other components within normal limits  CBC WITH DIFFERENTIAL/PLATELET  Radiology DG Shoulder Right  Result Date: 03/07/2022 CLINICAL DATA:  72 year old female with history of right shoulder pain. EXAM: RIGHT SHOULDER - 2+ VIEW COMPARISON:  No priors. FINDINGS: Three views of the right shoulder demonstrate no acute displaced fracture, subluxation or dislocation. There is joint space narrowing, subchondral sclerosis and osteophyte formation in the acromioclavicular and glenohumeral joints, compatible with mild osteoarthritis. IMPRESSION: 1. No acute radiographic abnormality of the  right shoulder. 2. Mild glenohumeral and acromioclavicular joint osteoarthritis. Electronically Signed   By: Vinnie Langton M.D.   On: 03/07/2022 08:48   DG Chest 2 View  Result Date: 03/07/2022 CLINICAL DATA:  72 year old female with history of right sided chest pain and shoulder pain extending into the neck. EXAM: CHEST - 2 VIEW COMPARISON:  Chest x-ray 04/23/2020. FINDINGS: Scattered areas of mild subsegmental atelectasis and/or scarring are noted in the lung bases. Lung volumes are normal. No consolidative airspace disease. No pleural effusions. No pneumothorax. No pulmonary nodule or mass noted. Pulmonary vasculature and the cardiomediastinal silhouette are within normal limits. IMPRESSION: 1. No radiographic evidence of acute cardiopulmonary disease. 2. Mild bibasilar subsegmental atelectasis and/or scarring. Electronically Signed   By: Vinnie Langton M.D.   On: 03/07/2022 08:47    Pertinent labs & imaging results that were available during my care of the patient were reviewed by me and considered in my medical decision making (see MDM for details).  Medications Ordered in ED Medications  acetaminophen (TYLENOL) tablet 1,000 mg (1,000 mg Oral Given 03/07/22 4166)                                                                                                                                     Procedures Procedures  (including critical care time)  Medical Decision Making / ED Course   MDM:  72 year old female presenting to the emergency department with right shoulder pain.  Pain localized to the right [place] joint.  Doubt septic joint, no fevers, chills, night sweats, and range of motion intact.  Less likely inflammatory without warmth or erythema.  Could represent osteoarthritis.  Doubt fracture without trauma.  Will obtain x-ray chest and shoulder.  Will treat pain with Tylenol.  Pain basic labs.  Clinical Course as of 03/07/22 0920  Mon Mar 07, 2022  0919 Labs reassuring without  leukocytosis.  X-rays without acute pathology.  Suspect [place] joint sprain.  Advise follow-up with primary doctor, Tylenol for pain.  Very low concern for infectious process but gave strict return precautions. Will discharge patient to home. All questions answered. Patient comfortable with plan of discharge. Return precautions discussed with patient and specified on the after visit summary.  [WS]    Clinical Course User Index [WS] Cristie Hem, MD     Additional history obtained: -External records from outside source obtained and reviewed including: Chart review including previous notes, labs, imaging, consultation notes including ED visit 11/13/21   Lab Tests: -I  ordered, reviewed, and interpreted labs.   The pertinent results include:   Labs Reviewed  BASIC METABOLIC PANEL - Abnormal; Notable for the following components:      Result Value   Glucose, Bld 105 (*)    All other components within normal limits  CBC WITH DIFFERENTIAL/PLATELET    Notable for no leukocytosis   Imaging Studies ordered: I ordered imaging studies including XR chest and shoulder On my interpretation imaging demonstrates no acute process I independently visualized and interpreted imaging. I agree with the radiologist interpretation   Medicines ordered and prescription drug management: Meds ordered this encounter  Medications   acetaminophen (TYLENOL) tablet 1,000 mg    -I have reviewed the patients home medicines and have made adjustments as needed   Social Determinants of Health:  Diagnosis or treatment significantly limited by social determinants of health: former smoker and obesity   Reevaluation: After the interventions noted above, I reevaluated the patient and found that they have improved  Co morbidities that complicate the patient evaluation  Past Medical History:  Diagnosis Date   Anxiety    Arthritis    GERD (gastroesophageal reflux disease)    Hypertension    Migraine     Panic attack    Panic disorder    UTI (lower urinary tract infection)    Vertigo       Dispostion: Disposition decision including need for hospitalization was considered, and patient discharged from emergency department.    Final Clinical Impression(s) / ED Diagnoses Final diagnoses:  Sternoclavicular joint pain, right     This chart was dictated using voice recognition software.  Despite best efforts to proofread,  errors can occur which can change the documentation meaning.    Cristie Hem, MD 03/07/22 [phone]

## 2022-03-07 NOTE — Discharge Instructions (Signed)
We evaluated you for your shoulder pain.  Your lab tests were normal and your x-ray did not show any broken bones or dislocations.  Your pain is likely due to sprain of your sternoclavicular joint, where your collarbone meets your sternum.  You can develop arthritis in this joint and it can cause swelling.  We did not see any signs of infection.  Take 1000 mg of Tylenol every 6 hours as needed for pain.  Please follow-up with your primary doctor.  Please return to the emergency department if you develop any fevers or chills, redness, increasing swelling, inability to move your shoulder, or any other concerning symptoms.

## 2022-03-07 NOTE — ED Triage Notes (Signed)
Patient complaining of right neck/shoulder/collarbone pain that started three days ago. States that pain radiates to side of neck. Several areas of swelling noted to area of concern. Denies injury, recently being sick. States that she woke with nasal congestion this am.

## 2022-04-18 ENCOUNTER — Ambulatory Visit (HOSPITAL_COMMUNITY): Payer: Medicare HMO

## 2022-05-18 ENCOUNTER — Ambulatory Visit (HOSPITAL_COMMUNITY): Payer: Medicare HMO

## 2022-05-18 ENCOUNTER — Ambulatory Visit (HOSPITAL_COMMUNITY)
Admission: RE | Admit: 2022-05-18 | Discharge: 2022-05-18 | Disposition: A | Discharge status: Home / Self Care | Payer: Medicaid Other | Source: Ambulatory Visit | Attending: Adult Health | Admitting: Adult Health

## 2022-05-18 DIAGNOSIS — Z1231 Encounter for screening mammogram for malignant neoplasm of breast: Secondary | ICD-10-CM | POA: Diagnosis not present

## 2022-09-14 ENCOUNTER — Ambulatory Visit (INDEPENDENT_AMBULATORY_CARE_PROVIDER_SITE_OTHER): Payer: 59 | Admitting: Dermatology

## 2022-09-14 ENCOUNTER — Encounter: Payer: Self-pay | Admitting: Dermatology

## 2022-09-14 DIAGNOSIS — L820 Inflamed seborrheic keratosis: Secondary | ICD-10-CM | POA: Diagnosis not present

## 2022-09-14 DIAGNOSIS — L309 Dermatitis, unspecified: Secondary | ICD-10-CM

## 2022-09-14 DIAGNOSIS — L821 Other seborrheic keratosis: Secondary | ICD-10-CM | POA: Diagnosis not present

## 2022-09-14 DIAGNOSIS — L720 Epidermal cyst: Secondary | ICD-10-CM | POA: Diagnosis not present

## 2022-09-14 MED ORDER — TRIAMCINOLONE ACETONIDE 0.1 % EX CREA: 1.0000 | TOPICAL_CREAM | Freq: Two times a day (BID) | CUTANEOUS | 1 refills | Status: AC | PRN

## 2022-09-14 NOTE — Progress Notes (Signed)
Follow-Up Visit   Subjective  Emily Cooke is a 72 y.o. female who presents for the following: patient had insect bite at right lower leg, she was given a topical antibiotic and it has not improved. Also a spot at right upper thigh that she scratches at, some under breasts and chest that itch.   The patient has spots, moles and lesions to be evaluated, some may be new or changing and the patient may have concern these could be cancer.  The following portions of the chart were reviewed this encounter and updated as appropriate: medications, allergies, medical history  Review of Systems:  No other skin or systemic complaints except as noted in HPI or Assessment and Plan.  Objective  Well appearing patient in no apparent distress; mood and affect are within normal limits.   A focused examination was performed of the following areas: Legs, abdomen, chest  Relevant exam findings are noted in the Assessment and Plan.  R chest x 1, R inframammary x 8, R post flank at braline x 1, L post flank at braline x 1, R lower hip x 1 (12) Erythematous stuck-on, waxy papule or plaque    Assessment & Plan   Dermatitis  Exam: Pink scaly patch  Treatment Plan: Start TMC 0.1% cream to affected areas twice daily until rash is clear. Avoid applying to face, groin, and axilla. Use as directed. Long-term use can cause thinning of the skin.  Topical steroids (such as triamcinolone, fluocinolone, fluocinonide, mometasone, clobetasol, halobetasol, betamethasone, hydrocortisone) can cause thinning and lightening of the skin if they are used for too long in the same area. Your physician has selected the right strength medicine for your problem and area affected on the body. Please use your medication only as directed by your physician to prevent side effects.    SEBORRHEIC KERATOSIS - Stuck-on, waxy, tan-brown papules and/or plaques  - 1.5 cm waxy tan patch at left lateral ankle -  Benign-appearing - Discussed benign etiology and prognosis. - Observe - Call for any changes    Inflamed seborrheic keratosis (12) R chest x 1, R inframammary x 8, R post flank at braline x 1, L post flank at braline x 1, R lower hip x 1  Symptomatic, irritating, patient would like treated.  Benign-appearing.  Call clinic for new or changing lesions.    Destruction of lesion - R chest x 1, R inframammary x 8, R post flank at braline x 1, L post flank at braline x 1, R lower hip x 1  Destruction method: cryotherapy   Informed consent: discussed and consent obtained   Lesion destroyed using liquid nitrogen: Yes   Region frozen until ice ball extended beyond lesion: Yes   Outcome: patient tolerated procedure well with no complications   Post-procedure details: wound care instructions given   Additional details:  Prior to procedure, discussed risks of blister formation, small wound, skin dyspigmentation, or rare scar following cryotherapy. Recommend Vaseline ointment to treated areas while healing.   Dermatitis  Related Medications triamcinolone cream (KENALOG) 0.1 % Apply 1 Application topically 2 (two) times daily as needed (Rash). Avoid applying to face, groin, and axilla. Use as directed. Long-term use can cause thinning of the skin.  [place] - tiny firm white papules at face - type of cyst - benign - may be extracted if symptomatic - observe - recommend OTC Effaclar at bedtime as tolerated  Return if symptoms worsen or fail to improve.  Anise Salvo, RMA, am  acting as scribe for Willeen Niece, MD .   Documentation: I have reviewed the above documentation for accuracy and completeness, and I agree with the above.  Willeen Niece, MD

## 2022-09-14 NOTE — Patient Instructions (Addendum)
Cryotherapy Aftercare  Wash gently with soap and water everyday.   Apply Vaseline and Band-Aid daily until healed.   Recommend over the counter Effaclar nightly as tolerated.   Can use "sandwich method" with moisturizer - apply moisturizer, then medication, then moisturizer.  Due to recent changes in healthcare laws, you may see results of your pathology and/or laboratory studies on MyChart before the doctors have had a chance to review them. We understand that in some cases there may be results that are confusing or concerning to you. Please understand that not all results are received at the same time and often the doctors may need to interpret multiple results in order to provide you with the best plan of care or course of treatment. Therefore, we ask that you please give [place] 2 business days to thoroughly review all your results before contacting the office for clarification. Should we see a critical lab result, you will be contacted sooner.   If You Need Anything After Your Visit  If you have any questions or concerns for your doctor, please call our main line at [phone] and press option 4 to reach your doctor's medical assistant. If no one answers, please leave a voicemail as directed and we will return your call as soon as possible. Messages left after 4 pm will be answered the following business day.   You may also send [place] a message via MyChart. We typically respond to MyChart messages within 1-2 business days.  For prescription refills, please ask your pharmacy to contact our office. Our fax number is [phone].  If you have an urgent issue when the clinic is closed that cannot wait until the next business day, you can page your doctor at the number below.    Please note that while we do our best to be available for urgent issues outside of office hours, we are not available 24/7.   If you have an urgent issue and are unable to reach [place], you may choose to seek medical care at  your doctor's office, retail clinic, urgent care center, or emergency room.  If you have a medical emergency, please immediately call 911 or go to the emergency department.  Pager Numbers  - Dr. Gwen Pounds: [phone]  - Dr. Neale Burly: [phone]  - Dr. Roseanne Reno: [phone]  In the event of inclement weather, please call our main line at [phone] for an update on the status of any delays or closures.  Dermatology Medication Tips: Please keep the boxes that topical medications come in in order to help keep track of the instructions about where and how to use these. Pharmacies typically print the medication instructions only on the boxes and not directly on the medication tubes.   If your medication is too expensive, please contact our office at [phone] option 4 or send [place] a message through MyChart.   We are unable to tell what your co-pay for medications will be in advance as this is different depending on your insurance coverage. However, we may be able to find a substitute medication at lower cost or fill out paperwork to get insurance to cover a needed medication.   If a prior authorization is required to get your medication covered by your insurance company, please allow [place] 1-2 business days to complete this process.  Drug prices often vary depending on where the prescription is filled and some pharmacies may offer cheaper prices.  The website www.goodrx.com contains coupons for medications through different pharmacies. The prices here do not  account for what the cost may be with help from insurance (it may be cheaper with your insurance), but the website can give you the price if you did not use any insurance.  - You can print the associated coupon and take it with your prescription to the pharmacy.  - You may also stop by our office during regular business hours and pick up a GoodRx coupon card.  - If you need your prescription sent electronically to a different pharmacy,  notify our office through [facility] or by phone at [phone] option 4.

## 2023-02-18 ENCOUNTER — Encounter (HOSPITAL_COMMUNITY): Payer: Self-pay

## 2023-02-18 ENCOUNTER — Emergency Department (HOSPITAL_COMMUNITY)
Admission: EM | Admit: 2023-02-18 | Discharge: 2023-02-18 | Disposition: A | Discharge status: Home / Self Care | Payer: 59 | Attending: Student | Admitting: Student

## 2023-02-18 ENCOUNTER — Other Ambulatory Visit: Payer: Self-pay

## 2023-02-18 DIAGNOSIS — R55 Syncope and collapse: Secondary | ICD-10-CM | POA: Diagnosis not present

## 2023-02-18 DIAGNOSIS — F419 Anxiety disorder, unspecified: Secondary | ICD-10-CM | POA: Diagnosis not present

## 2023-02-18 DIAGNOSIS — Z87891 Personal history of nicotine dependence: Secondary | ICD-10-CM | POA: Diagnosis not present

## 2023-02-18 DIAGNOSIS — Z79899 Other long term (current) drug therapy: Secondary | ICD-10-CM | POA: Diagnosis not present

## 2023-02-18 DIAGNOSIS — R002 Palpitations: Secondary | ICD-10-CM | POA: Diagnosis present

## 2023-02-18 DIAGNOSIS — I10 Essential (primary) hypertension: Secondary | ICD-10-CM | POA: Insufficient documentation

## 2023-02-18 DIAGNOSIS — K5900 Constipation, unspecified: Secondary | ICD-10-CM | POA: Insufficient documentation

## 2023-02-18 LAB — CBC WITH DIFFERENTIAL/PLATELET
Abs Immature Granulocytes: 0.01 10*3/uL (ref 0.00–0.07)
Basophils Absolute: 0 10*3/uL (ref 0.0–0.1)
Basophils Relative: 1 %
Eosinophils Absolute: 0.2 10*3/uL (ref 0.0–0.5)
Eosinophils Relative: 3 %
HCT: 39.5 % (ref 36.0–46.0)
Hemoglobin: 13.2 g/dL (ref 12.0–15.0)
Immature Granulocytes: 0 %
Lymphocytes Relative: 44 %
Lymphs Abs: 2.7 10*3/uL (ref 0.7–4.0)
MCH: 30.3 pg (ref 26.0–34.0)
MCHC: 33.4 g/dL (ref 30.0–36.0)
MCV: 90.8 fL (ref 80.0–100.0)
Monocytes Absolute: 0.4 10*3/uL (ref 0.1–1.0)
Monocytes Relative: 7 %
Neutro Abs: 2.7 10*3/uL (ref 1.7–7.7)
Neutrophils Relative %: 45 %
Platelets: 187 10*3/uL (ref 150–400)
RBC: 4.35 MIL/uL (ref 3.87–5.11)
RDW: 12.6 % (ref 11.5–15.5)
WBC: 6.1 10*3/uL (ref 4.0–10.5)
nRBC: 0 % (ref 0.0–0.2)

## 2023-02-18 LAB — COMPREHENSIVE METABOLIC PANEL
ALT: 14 U/L (ref 0–44)
AST: 19 U/L (ref 15–41)
Albumin: 4.1 g/dL (ref 3.5–5.0)
Alkaline Phosphatase: 65 U/L (ref 38–126)
Anion gap: 10 (ref 5–15)
BUN: 13 mg/dL (ref 8–23)
CO2: 24 mmol/L (ref 22–32)
Calcium: 9.1 mg/dL (ref 8.9–10.3)
Chloride: 104 mmol/L (ref 98–111)
Creatinine, Ser: 0.74 mg/dL (ref 0.44–1.00)
GFR, Estimated: >60 mL/min (ref >60)
Glucose, Bld: 94 mg/dL (ref 70–99)
Potassium: 3.7 mmol/L (ref 3.5–5.1)
Sodium: 138 mmol/L (ref 135–145)
Total Bilirubin: 0.6 mg/dL (ref <1.2)
Total Protein: 6.9 g/dL (ref 6.5–8.1)

## 2023-02-18 LAB — TROPONIN I (HIGH SENSITIVITY): Troponin I (High Sensitivity): <2 ng/L (ref <18)

## 2023-02-18 MED ORDER — POLYETHYLENE GLYCOL 3350 17 G PO PACK: 17.0000 g | PACK | Freq: Two times a day (BID) | ORAL | 0 refills | Status: AC

## 2023-02-18 MED ORDER — HYDROXYZINE HCL 25 MG PO TABS
12.5000 mg | ORAL_TABLET | Freq: Once | ORAL | Status: AC
Start: 2023-02-18 — End: 2023-02-18
  Administered 2023-02-18: 12.5 mg via ORAL
  Filled 2023-02-18: qty 1

## 2023-02-18 NOTE — ED Triage Notes (Signed)
Pt reports she is not sure if she confused her BP meds and took pantoprazole instead.  Pt reports having palpitations and feeling anxious with a hx of panic attacks.

## 2023-02-18 NOTE — ED Provider Notes (Signed)
[place] EMERGENCY DEPARTMENT AT [facility] Provider Note  CSN: [id number] Arrival date & time: 02/18/23 1707  Chief Complaint(s) Palpitations  HPI Emily Cooke is a 72 y.o. female with PMH anxiety, panic disorder, HTN, GERD who presents emergency department for evaluation of palpitations and near syncope.  She states that this morning she is unsure if she took her blood pressure medications or an additional dose of her pantoprazole.  She felt poorly and went to sleep but then woke up with a sensation of panic and palpitations with hyperventilation.  She had an episode of near syncope and came to the hospital for further evaluation.  Here in the emergency department, she states that she still feels fairly nervous but says the palpitations have resolved and she is currently denying chest pain, shortness of breath, abdominal pain, nausea, vomiting or other systemic symptoms.   Past Medical History Past Medical History:  Diagnosis Date   Anxiety    Arthritis    GERD (gastroesophageal reflux disease)    Hypertension    Migraine    Panic attack    Panic disorder    UTI (lower urinary tract infection)    Vertigo    There are no active problems to display for this patient.  Home Medication(s) Prior to Admission medications   Medication Sig Start Date End Date Taking? Authorizing Provider  polyethylene glycol (MIRALAX) 17 g packet Take 17 g by mouth in the morning and at bedtime. 02/18/23  Yes Jahzeel Poythress, MD  acetaminophen (TYLENOL) 500 MG tablet Take 1 tablet (500 mg total) by mouth every 6 (six) hours as needed for moderate pain. 06/20/19   Lorelee New, PA-C  amLODipine (NORVASC) 2.5 MG tablet  12/26/19   [provider]  cyclobenzaprine (FLEXERIL) 10 MG tablet Take 1 tablet (10 mg total) by mouth 2 (two) times daily as needed for muscle spasms. 03/25/18   Zadie Rhine, MD  DEXILANT 60 MG capsule Take 60 mg by mouth daily.  05/31/16   [provider]  dicyclomine (BENTYL) 20 MG tablet Take 1 tablet (20 mg total) by mouth 3 (three) times daily as needed (abdominal cramping). 02/15/18   Mesner, Barbara Cower, MD  hydrocortisone 2.5 % cream Apply topically 2 (two) times daily as needed (Rash). 01/28/20   Willeen Niece, MD  hydrOXYzine (ATARAX/VISTARIL) 25 MG tablet Take 1 tablet by mouth three times daily as needed anxiety. 01/12/15   [provider]  lisinopril (PRINIVIL,ZESTRIL) 5 MG tablet Take 0.5 tablets (2.5 mg total) by mouth daily. 04/09/14   Zadie Rhine, MD  meclizine (ANTIVERT) 25 MG tablet TK 1 T PO TID PRN 05/01/18   [provider]  meloxicam (MOBIC) 15 MG tablet  04/17/18   [provider]  pantoprazole (PROTONIX) 40 MG tablet TK 1 T PO QD 05/21/18   [provider]  propranolol (INDERAL) 10 MG tablet Take 10 mg by mouth at bedtime.      [provider]  triamcinolone cream (KENALOG) 0.1 % Apply 1 Application topically 2 (two) times daily as needed (Rash). Avoid applying to face, groin, and axilla. Use as directed. Long-term use can cause thinning of the skin. 09/14/22   Willeen Niece, MD  VENTOLIN HFA 108 [phone] Base) MCG/ACT inhaler  07/23/18   [provider]  Past Surgical History Past Surgical History:  Procedure Laterality Date   ABDOMINAL HYSTERECTOMY     partial   BREAST BIOPSY     benighn   BREAST SURGERY     Family History Family History  Problem Relation Age of Onset   Hypertension Mother    Diabetes Father     Social History Social History   Tobacco Use   Smoking status: Former    Current packs/day: 0.00    Average packs/day: 2.0 packs/day for 30.0 years (60.0 ttl pk-yrs)    Types: Cigarettes    Start date: 04/01/1972    Quit date: 04/01/2002    Years since quitting: 20.8   Smokeless tobacco: Never  Vaping Use   Vaping  status: Never Used  Substance Use Topics   Alcohol use: No   Drug use: No   Allergies Cephalexin, Ibuprofen, Amoxicillin, Bactrim [sulfamethoxazole-trimethoprim], Ciprofloxacin, Imitrex [sumatriptan succinate], Morphine and codeine, Tetracyclines & related, and Zoloft [sertraline hcl]  Review of Systems Review of Systems  Cardiovascular:  Positive for palpitations.  Neurological:  Positive for light-headedness.    Physical Exam Vital Signs  I have reviewed the triage vital signs BP (!) 174/89 (BP Location: Left Arm)   Pulse 90   Temp 97.6 F (36.4 C) (Oral)   Resp 16   Wt 92.5 kg   SpO2 100%   BMI 31.02 kg/m   Physical Exam Vitals and nursing note reviewed.  Constitutional:      General: She is not in acute distress.    Appearance: She is well-developed.  HENT:     Head: Normocephalic and atraumatic.  Eyes:     Conjunctiva/sclera: Conjunctivae normal.  Cardiovascular:     Rate and Rhythm: Normal rate and regular rhythm.     Heart sounds: No murmur heard. Pulmonary:     Effort: Pulmonary effort is normal. No respiratory distress.     Breath sounds: Normal breath sounds.  Abdominal:     Palpations: Abdomen is soft.     Tenderness: There is no abdominal tenderness.  Musculoskeletal:        General: No swelling.     Cervical back: Neck supple.  Skin:    General: Skin is warm and dry.     Capillary Refill: Capillary refill takes less than 2 seconds.  Neurological:     Mental Status: She is alert.  Psychiatric:        Mood and Affect: Mood normal.     ED Results and Treatments Labs (all labs ordered are listed, but only abnormal results are displayed) Labs Reviewed  COMPREHENSIVE METABOLIC PANEL  CBC WITH DIFFERENTIAL/PLATELET  TROPONIN I (HIGH SENSITIVITY)                                                                                                                          Radiology No results found.  Pertinent labs & imaging results that were  available during my care of the patient were reviewed by me  and considered in my medical decision making (see MDM for details).  Medications Ordered in ED Medications  hydrOXYzine (ATARAX) tablet 12.5 mg (12.5 mg Oral Given 02/18/23 1801)                                                                                                                                     Procedures Procedures  (including critical care time)  Medical Decision Making / ED Course   This patient presents to the ED for concern of palpitations, anxiety, constipation, this involves an extensive number of treatment options, and is a complaint that carries with it a high risk of complications and morbidity.  The differential diagnosis includes electrolyte abnormality, arrhythmia, anxiety, panic attack  MDM: Patient seen emergency room for evaluation of palpitations and presyncope.  Physical exam is unremarkable with a reassuringly normal cardiopulmonary exam.  No murmurs or rubs heard.  Laboratory evaluation reassuringly unremarkable including negative high-sensitivity troponin.  ECG nonischemic and without evidence of arrhythmia.  Patient observed in the emergency department for 80 minutes without evidence of arrhythmia.  Patient given half dose Atarax with improvement of her symptoms.  Presentation consistent with likely panic attack today especially in the setting of her known panic disorder.  At this time she does not meet inpatient criteria for admission and will be discharged with outpatient follow-up.  Strict return precautions given which she voiced understanding.   Additional history obtained:  -External records from outside source obtained and reviewed including: Chart review including previous notes, labs, imaging, consultation notes   Lab Tests: -I ordered, reviewed, and interpreted labs.   The pertinent results include:   Labs Reviewed  COMPREHENSIVE METABOLIC PANEL  CBC WITH DIFFERENTIAL/PLATELET   TROPONIN I (HIGH SENSITIVITY)      EKG   EKG Interpretation Date/Time:  Saturday February 18 2023 17:28:09 EST Ventricular Rate:  91 PR Interval:  161 QRS Duration:  91 QT Interval:  368 QTC Calculation: 453 R Axis:   12  Text Interpretation: Sinus rhythm Confirmed by Kaushik Maul ([id number]) on 02/18/2023 5:59:36 PM         Medicines ordered and prescription drug management: Meds ordered this encounter  Medications   hydrOXYzine (ATARAX) tablet 12.5 mg   polyethylene glycol (MIRALAX) 17 g packet    Sig: Take 17 g by mouth in the morning and at bedtime.    Dispense:  14 each    Refill:  0    -I have reviewed the patients home medicines and have made adjustments as needed  Critical interventions none    Cardiac Monitoring: The patient was maintained on a cardiac monitor.  I personally viewed and interpreted the cardiac monitored which showed an underlying rhythm of: NSR  Social Determinants of Health:  Factors impacting patients care include: none   Reevaluation: After the interventions noted above, I reevaluated the patient and found that they have :improved  Co morbidities that complicate  the patient evaluation  Past Medical History:  Diagnosis Date   Anxiety    Arthritis    GERD (gastroesophageal reflux disease)    Hypertension    Migraine    Panic attack    Panic disorder    UTI (lower urinary tract infection)    Vertigo       Dispostion: I considered admission for this patient, but at this time she does not meet inpatient criteria for admission and will be discharged with outpatient follow-up     Final Clinical Impression(s) / ED Diagnoses Final diagnoses:  Palpitations  Anxiety  Constipation, unspecified constipation type     @PCDICTATION @    Glendora Score, MD 02/18/23 1830

## 2023-04-14 ENCOUNTER — Other Ambulatory Visit (HOSPITAL_COMMUNITY): Payer: Self-pay | Admitting: Adult Health

## 2023-04-14 DIAGNOSIS — Z1231 Encounter for screening mammogram for malignant neoplasm of breast: Secondary | ICD-10-CM

## 2023-05-24 ENCOUNTER — Ambulatory Visit (HOSPITAL_COMMUNITY)
Admission: RE | Admit: 2023-05-24 | Discharge: 2023-05-24 | Disposition: A | Discharge status: Home / Self Care | Payer: 59 | Source: Ambulatory Visit | Attending: Adult Health | Admitting: Adult Health

## 2023-05-24 ENCOUNTER — Encounter (HOSPITAL_COMMUNITY): Payer: Self-pay

## 2023-05-24 DIAGNOSIS — Z1231 Encounter for screening mammogram for malignant neoplasm of breast: Secondary | ICD-10-CM | POA: Diagnosis present

## 2023-06-07 ENCOUNTER — Other Ambulatory Visit (HOSPITAL_COMMUNITY): Payer: Self-pay | Admitting: Adult Health

## 2023-06-07 DIAGNOSIS — R928 Other abnormal and inconclusive findings on diagnostic imaging of breast: Secondary | ICD-10-CM

## 2023-06-15 ENCOUNTER — Ambulatory Visit (HOSPITAL_COMMUNITY)
Admission: RE | Admit: 2023-06-15 | Discharge: 2023-06-15 | Disposition: A | Discharge status: Home / Self Care | Source: Ambulatory Visit | Attending: Adult Health | Admitting: Adult Health

## 2023-06-15 DIAGNOSIS — R928 Other abnormal and inconclusive findings on diagnostic imaging of breast: Secondary | ICD-10-CM | POA: Diagnosis present

## 2023-07-15 ENCOUNTER — Other Ambulatory Visit: Payer: Self-pay

## 2023-07-15 ENCOUNTER — Emergency Department (HOSPITAL_COMMUNITY)
Admission: EM | Admit: 2023-07-15 | Discharge: 2023-07-15 | Disposition: A | Discharge status: Home / Self Care | Attending: Emergency Medicine | Admitting: Emergency Medicine

## 2023-07-15 ENCOUNTER — Encounter (HOSPITAL_COMMUNITY): Payer: Self-pay

## 2023-07-15 DIAGNOSIS — I10 Essential (primary) hypertension: Secondary | ICD-10-CM | POA: Insufficient documentation

## 2023-07-15 DIAGNOSIS — H9202 Otalgia, left ear: Secondary | ICD-10-CM | POA: Diagnosis not present

## 2023-07-15 DIAGNOSIS — Z79899 Other long term (current) drug therapy: Secondary | ICD-10-CM | POA: Insufficient documentation

## 2023-07-15 DIAGNOSIS — K0889 Other specified disorders of teeth and supporting structures: Secondary | ICD-10-CM | POA: Diagnosis present

## 2023-07-15 MED ORDER — HYDROCODONE-ACETAMINOPHEN 5-325 MG PO TABS
2.0000 | ORAL_TABLET | Freq: Once | ORAL | Status: AC
  Administered 2023-07-15: 2 via ORAL
  Filled 2023-07-15: qty 2

## 2023-07-15 MED ORDER — CLINDAMYCIN HCL 300 MG PO CAPS: 300.0000 mg | ORAL_CAPSULE | Freq: Four times a day (QID) | ORAL | 0 refills | Status: AC

## 2023-07-15 MED ORDER — CLINDAMYCIN HCL 150 MG PO CAPS
300.0000 mg | ORAL_CAPSULE | Freq: Once | ORAL | Status: AC
  Administered 2023-07-15: 300 mg via ORAL
  Filled 2023-07-15: qty 2

## 2023-07-15 MED ORDER — HYDROCODONE-ACETAMINOPHEN 5-325 MG PO TABS: 1.0000 | ORAL_TABLET | Freq: Four times a day (QID) | ORAL | 0 refills | Status: AC | PRN

## 2023-07-15 NOTE — ED Provider Notes (Signed)
 [place] EMERGENCY DEPARTMENT AT [facility] Provider Note   CSN: [id number] Arrival date & time: 07/15/23  2255     History  Chief Complaint  Patient presents with   Dental Pain   Otalgia    Emily Cooke is a 73 y.o. female.  Patient is a 73 year old female with history of hypertension, GERD.  Patient presenting today with complaints of left ear and dental pain.  This has been worsening over the past several days.  No fevers or chills.  No difficulty breathing or swallowing.  No hearing loss.  No aggravating or alleviating factors.       Home Medications Prior to Admission medications   Medication Sig Start Date End Date Taking? Authorizing Provider  acetaminophen  (TYLENOL ) 500 MG tablet Take 1 tablet (500 mg total) by mouth every 6 (six) hours as needed for moderate pain. 06/20/19   Jhonnie Mosher, PA-C  amLODipine (NORVASC) 2.5 MG tablet  12/26/19   [provider]  cyclobenzaprine  (FLEXERIL ) 10 MG tablet Take 1 tablet (10 mg total) by mouth 2 (two) times daily as needed for muscle spasms. 03/25/18   Eldon Greenland, MD  DEXILANT 60 MG capsule Take 60 mg by mouth daily.  05/31/16   [provider]  dicyclomine  (BENTYL ) 20 MG tablet Take 1 tablet (20 mg total) by mouth 3 (three) times daily as needed (abdominal cramping). 02/15/18   Mesner, Reymundo Caulk, MD  hydrocortisone  2.5 % cream Apply topically 2 (two) times daily as needed (Rash). 01/28/20   Artemio Larry, MD  hydrOXYzine  (ATARAX /VISTARIL ) 25 MG tablet Take 1 tablet by mouth three times daily as needed anxiety. 01/12/15   [provider]  lisinopril  (PRINIVIL ,ZESTRIL ) 5 MG tablet Take 0.5 tablets (2.5 mg total) by mouth daily. 04/09/14   Eldon Greenland, MD  meclizine  (ANTIVERT ) 25 MG tablet TK 1 T PO TID PRN 05/01/18   [provider]  meloxicam (MOBIC) 15 MG tablet  04/17/18   [provider]  pantoprazole (PROTONIX) 40 MG tablet TK 1 T PO QD 05/21/18   [provider]  polyethylene glycol (MIRALAX ) 17 g packet Take 17 g by mouth in the morning and at bedtime. 02/18/23   Kommor, Madison, MD  propranolol (INDERAL) 10 MG tablet Take 10 mg by mouth at bedtime.      [provider]  triamcinolone  cream (KENALOG ) 0.1 % Apply 1 Application topically 2 (two) times daily as needed (Rash). Avoid applying to face, groin, and axilla. Use as directed. Long-term use can cause thinning of the skin. 09/14/22   Artemio Larry, MD  VENTOLIN HFA 108 (90 Base) MCG/ACT inhaler  07/23/18   [provider]      Allergies    Cephalexin, Ibuprofen , Amoxicillin, Bactrim [sulfamethoxazole-trimethoprim], Ciprofloxacin, Imitrex [sumatriptan succinate], Morphine and codeine , Tetracyclines & related, and Zoloft [sertraline hcl]    Review of Systems   Review of Systems  All other systems reviewed and are negative.   Physical Exam Updated Vital Signs BP (!) 115/102   Pulse 78   Temp 97.8 F (36.6 C) (Oral)   Ht 5\' 8"  (1.727 m)   Wt 96.2 kg   SpO2 95%   BMI 32.23 kg/m  Physical Exam Vitals and nursing note reviewed.  Constitutional:      Appearance: Normal appearance.  HENT:     Head: Normocephalic.     Left Ear: Ear canal normal.     Ears:     Comments: The left TM is somewhat  erythematous.    Mouth/Throat:     Mouth: Mucous membranes are moist.     Pharynx: No oropharyngeal exudate.     Comments: The left upper molars have minimal decay.  There is no obvious abscess, but there is mild gingival inflammation. Pulmonary:     Effort: Pulmonary effort is normal.  Skin:    General: Skin is warm and dry.  Neurological:     Mental Status: She is alert and oriented to person, place, and time.     ED Results / Procedures / Treatments   Labs (all labs ordered are listed, but only abnormal results are displayed) Labs Reviewed - No data to display  EKG None  Radiology No results found.  Procedures Procedures    Medications Ordered  in ED Medications  clindamycin  (CLEOCIN ) capsule 300 mg (has no administration in time range)  HYDROcodone -acetaminophen  (NORCO/VICODIN) 5-325 MG per tablet 2 tablet (has no administration in time range)    ED Course/ Medical Decision Making/ A&P  Patient with ear and dental pain.  Suspect mainly a dental issue.  Patient will be treated with clindamycin  and hydrocodone .  To follow-up with dentistry.  Final Clinical Impression(s) / ED Diagnoses Final diagnoses:  None    Rx / DC Orders ED Discharge Orders     None         Orvilla Blander, MD 07/15/23 2332

## 2023-07-15 NOTE — ED Triage Notes (Signed)
 Pt arrived from home via POV c/o left sided dental and ear pain 10/10 on pain scale that began on Friday. Pt states that she can feel the swelling, but denies difficulty breathing.

## 2023-07-15 NOTE — Discharge Instructions (Signed)
Begin taking clindamycin as prescribed.  Begin taking hydrocodone as prescribed as needed for pain.  Follow-up with dentistry in the next few days. 

## 2023-09-27 ENCOUNTER — Emergency Department (HOSPITAL_COMMUNITY)
Admission: EM | Admit: 2023-09-27 | Discharge: 2023-09-27 | Discharge status: Against Medical Advice | Attending: Emergency Medicine | Admitting: Emergency Medicine

## 2023-09-27 ENCOUNTER — Other Ambulatory Visit: Payer: Self-pay

## 2023-09-27 DIAGNOSIS — R002 Palpitations: Secondary | ICD-10-CM

## 2023-09-27 DIAGNOSIS — R251 Tremor, unspecified: Secondary | ICD-10-CM | POA: Insufficient documentation

## 2023-09-27 DIAGNOSIS — Z5321 Procedure and treatment not carried out due to patient leaving prior to being seen by health care provider: Secondary | ICD-10-CM | POA: Diagnosis not present

## 2023-09-27 LAB — CBG MONITORING, ED: Glucose-Capillary: 122 mg/dL — ABNORMAL HIGH (ref 70–99)

## 2023-09-27 NOTE — ED Notes (Signed)
 Patient states that she would rather just go home and take her anxiety medication since she has been sitting here for 2 hours with nothing. AMA form signed and MD Zammit notified.

## 2023-09-27 NOTE — ED Triage Notes (Signed)
 Pt reports she has felt some shaking in her arms and felt like her heart was fluttering today.  Reports hx of anxiety disorder

## 2024-01-21 ENCOUNTER — Encounter (HOSPITAL_COMMUNITY): Payer: Self-pay | Admitting: *Deleted

## 2024-01-21 ENCOUNTER — Other Ambulatory Visit: Payer: Self-pay

## 2024-01-21 ENCOUNTER — Emergency Department (HOSPITAL_COMMUNITY)
Admission: EM | Admit: 2024-01-21 | Discharge: 2024-01-21 | Disposition: A | Discharge status: Home / Self Care | Attending: Emergency Medicine | Admitting: Emergency Medicine

## 2024-01-21 DIAGNOSIS — H6123 Impacted cerumen, bilateral: Secondary | ICD-10-CM | POA: Insufficient documentation

## 2024-01-21 DIAGNOSIS — J069 Acute upper respiratory infection, unspecified: Secondary | ICD-10-CM | POA: Diagnosis not present

## 2024-01-21 DIAGNOSIS — J029 Acute pharyngitis, unspecified: Secondary | ICD-10-CM | POA: Insufficient documentation

## 2024-01-21 LAB — RESP PANEL BY RT-PCR (RSV, FLU A&B, COVID)  RVPGX2
Influenza A by PCR: NEGATIVE
Influenza B by PCR: NEGATIVE
Resp Syncytial Virus by PCR: NEGATIVE
SARS Coronavirus 2 by RT PCR: NEGATIVE

## 2024-01-21 LAB — GROUP A STREP BY PCR: Group A Strep by PCR: NOT DETECTED

## 2024-01-21 MED ORDER — AZITHROMYCIN 250 MG PO TABS
250.0000 mg | ORAL_TABLET | Freq: Every day | ORAL | 0 refills | Status: AC
Start: 2024-01-21 — End: ?

## 2024-01-21 MED ORDER — CARBAMIDE PEROXIDE 6.5 % OT SOLN
5.0000 [drp] | Freq: Once | OTIC | Status: AC
  Administered 2024-01-21: 5 [drp] via OTIC
  Filled 2024-01-21: qty 15

## 2024-01-21 NOTE — ED Provider Notes (Signed)
 [place] EMERGENCY DEPARTMENT AT [facility] Provider Note   CSN: [id number] Arrival date & time: 01/21/24  9284     Patient presents with: Sore Throat   Emily Cooke is a 73 y.o. female.   73 year old female history of anxiety who presents to the emergency department with sore throat.  Patient reports that since Friday she has been having subjective fevers, congestion, ear pain bilaterally, and a sore throat.  Has a discomfort with swallowing as well but is still tolerating p.o. and her secretions.  Was unable to see her primary doctor so decided to come into the emergency department.  No cough.  Says a family member is sick with a URI right now.  Says she has had this happen before with colds and that has resolved on its own.       Prior to Admission medications   Medication Sig Start Date End Date Taking? Authorizing Provider  azithromycin  (ZITHROMAX ) 250 MG tablet Take 1 tablet (250 mg total) by mouth daily. Take first 2 tablets together, then 1 every day until finished. 01/21/24  Yes Yolande Lamar BROCKS, MD  acetaminophen  (TYLENOL ) 500 MG tablet Take 1 tablet (500 mg total) by mouth every 6 (six) hours as needed for moderate pain. 06/20/19   Landy Honora CROME, PA-C  amLODipine (NORVASC) 2.5 MG tablet  12/26/19   [provider]  clindamycin  (CLEOCIN ) 300 MG capsule Take 1 capsule (300 mg total) by mouth 4 (four) times daily. X 7 days 07/15/23   Geroldine Berg, MD  cyclobenzaprine  (FLEXERIL ) 10 MG tablet Take 1 tablet (10 mg total) by mouth 2 (two) times daily as needed for muscle spasms. 03/25/18   Midge Golas, MD  DEXILANT 60 MG capsule Take 60 mg by mouth daily.  05/31/16   [provider]  dicyclomine  (BENTYL ) 20 MG tablet Take 1 tablet (20 mg total) by mouth 3 (three) times daily as needed (abdominal cramping). 02/15/18   Mesner, Selinda, MD  HYDROcodone -acetaminophen  (NORCO/VICODIN) 5-325 MG tablet Take 1-2 tablets by mouth every 6 (six) hours as  needed. 07/15/23   Geroldine Berg, MD  hydrocortisone  2.5 % cream Apply topically 2 (two) times daily as needed (Rash). 01/28/20   Jackquline Sawyer, MD  hydrOXYzine  (ATARAX /VISTARIL ) 25 MG tablet Take 1 tablet by mouth three times daily as needed anxiety. 01/12/15   [provider]  lisinopril  (PRINIVIL ,ZESTRIL ) 5 MG tablet Take 0.5 tablets (2.5 mg total) by mouth daily. 04/09/14   Midge Golas, MD  meclizine  (ANTIVERT ) 25 MG tablet TK 1 T PO TID PRN 05/01/18   [provider]  meloxicam (MOBIC) 15 MG tablet  04/17/18   [provider]  pantoprazole (PROTONIX) 40 MG tablet TK 1 T PO QD 05/21/18   [provider]  polyethylene glycol (MIRALAX ) 17 g packet Take 17 g by mouth in the morning and at bedtime. 02/18/23   Kommor, Madison, MD  propranolol (INDERAL) 10 MG tablet Take 10 mg by mouth at bedtime.      [provider]  triamcinolone  cream (KENALOG ) 0.1 % Apply 1 Application topically 2 (two) times daily as needed (Rash). Avoid applying to face, groin, and axilla. Use as directed. Long-term use can cause thinning of the skin. 09/14/22   Stewart, Tara, MD  VENTOLIN HFA 108 (90 Base) MCG/ACT inhaler  07/23/18   [provider]    Allergies: Cephalexin, Ibuprofen , Amoxicillin, Bactrim [sulfamethoxazole-trimethoprim], Ciprofloxacin, Imitrex [sumatriptan succinate], Morphine and codeine , Tetracyclines & related, and Zoloft [sertraline hcl]  Review of Systems  Updated Vital Signs BP 125/73   Pulse 67   Temp 98 F (36.7 C) (Oral)   Resp 18   Ht 5' 8 (1.727 m)   Wt 101.6 kg   SpO2 98%   BMI 34.06 kg/m   Physical Exam Vitals and nursing note reviewed.  Constitutional:      General: She is not in acute distress.    Appearance: She is well-developed.  HENT:     Head: Normocephalic and atraumatic.     Ears:     Comments: Cerumen impacted bilaterally    Nose: Congestion present.     Mouth/Throat:     Mouth: Mucous membranes are moist.      Pharynx: Oropharynx is clear. No oropharyngeal exudate or posterior oropharyngeal erythema.     Comments: Uvula: without deviation or edema. Uvula midline. Soft palate: without swelling Sublingual: normal appearance/no brawny edema or tongue elevation Tonsils: no erythema or exudates  Eyes:     Extraocular Movements: Extraocular movements intact.     Conjunctiva/sclera: Conjunctivae normal.     Pupils: Pupils are equal, round, and reactive to light.  Neck:     Comments: Anterior lymphadenopathy on the right. Cardiovascular:     Rate and Rhythm: Normal rate and regular rhythm.     Heart sounds: No murmur heard. Pulmonary:     Effort: Pulmonary effort is normal. No respiratory distress.     Breath sounds: Normal breath sounds. No stridor.  Musculoskeletal:     Cervical back: Normal range of motion and neck supple.  Skin:    General: Skin is warm and dry.  Neurological:     Mental Status: She is alert and oriented to person, place, and time. Mental status is at baseline.  Psychiatric:        Mood and Affect: Mood normal.     (all labs ordered are listed, but only abnormal results are displayed) Labs Reviewed  GROUP A STREP BY PCR  RESP PANEL BY RT-PCR (RSV, FLU A&B, COVID)  [id number]    EKG: None  Radiology: No results found.   Ear Cerumen Removal  Date/Time: 01/21/2024 6:32 PM  Performed by: Yolande Lamar BROCKS, MD Authorized by: Yolande Lamar BROCKS, MD   Consent:    Consent obtained:  Verbal   Consent given by:  Patient   Risks, benefits, and alternatives were discussed: yes     Risks discussed:  Incomplete removal and TM perforation Universal protocol:    Patient identity confirmed:  Verbally with patient Procedure details:    Location:  L ear and R ear   Procedure type: curette     Procedure outcomes: unable to remove cerumen   Post-procedure details:    Inspection:  No bleeding   Hearing quality:  Normal   Procedure completion:  Tolerated well, no immediate  complications    Medications Ordered in the ED  carbamide peroxide (DEBROX) 6.5 % OTIC (EAR) solution 5 drop (5 drops Both EARS Given 01/21/24 0907)                                    Medical Decision Making Risk OTC drugs. Prescription drug management.   74 year old female who presents to the emergency department with a sore throat and ear pain  Initial Ddx:  Pharyngitis, URI, otitis media, peritonsillar abscess  MDM/Course:  Patient presents to the emergency department with sore throat.  Also is  having bilateral ear pain and pain with swallowing.  Systemic symptoms.  Not in acute distress.  No concern for airway compromise.  No signs of peritonsillar abscess or deep space infection currently.  No stridor.  Cerumen is impacted bilaterally and did attempt to remove it with both of an ear curette and Debrox with irrigation but was unsuccessful in removal of her earwax.  COVID and flu are negative.  Strep negative.  In case of otitis media we will go ahead and treat her with azithromycin  because of her allergies.  Will have her follow-up with her PCP.  May also benefit from ENT evaluation if she has continued severe cerumen impactions like this  This patient presents to the ED for concern of complaints listed in HPI, this involves an extensive number of treatment options, and is a complaint that carries with it a high risk of complications and morbidity. Disposition including potential need for admission considered.   Dispo: DC Home. Return precautions discussed including, but not limited to, those listed in the AVS. Allowed pt time to ask questions which were answered fully prior to dc.  Records reviewed Outpatient Clinic Notes I have reviewed the patients home medications and made adjustments as needed Social Determinants of health:  Geriatric  Portions of this note were generated with Scientist, clinical (histocompatibility and immunogenetics). Dictation errors may occur despite best attempts at proofreading.      Final diagnoses:  Pharyngitis, unspecified etiology  Upper respiratory tract infection, unspecified type  Bilateral impacted cerumen    ED Discharge Orders          Ordered    azithromycin  (ZITHROMAX ) 250 MG tablet  Daily        01/21/24 1017               Yolande Lamar BROCKS, MD 01/21/24 1840

## 2024-01-21 NOTE — Discharge Instructions (Signed)
 You were seen for your upper respiratory tract infection and possible ear infection in the emergency department.   At home, please take the antibiotics we prescribed you.    Check your MyChart online for the results of any tests that had not resulted by the time you left the emergency department.   Follow-up with your primary doctor in 2-3 days regarding your visit.  Please see if they can clean out your ears again.  Return immediately to the emergency department if you experience any of the following: High fevers, difficulty breathing, or any other concerning symptoms.    Thank you for visiting our Emergency Department. It was a pleasure taking care of you today.

## 2024-01-21 NOTE — ED Triage Notes (Signed)
 Pt c/o sore throat x 2 days ago  Pt states she is unable to sleep and pt is c/o bilateral ear pain

## 2024-01-21 NOTE — ED Notes (Signed)
 Amb ind to [facility]

## 2024-03-07 ENCOUNTER — Emergency Department (HOSPITAL_COMMUNITY)

## 2024-03-07 ENCOUNTER — Encounter (HOSPITAL_COMMUNITY): Payer: Self-pay | Admitting: Emergency Medicine

## 2024-03-07 ENCOUNTER — Emergency Department (HOSPITAL_COMMUNITY)
Admission: EM | Admit: 2024-03-07 | Discharge: 2024-03-07 | Disposition: A | Discharge status: Home / Self Care | Source: Home / Self Care | Attending: Emergency Medicine | Admitting: Emergency Medicine

## 2024-03-07 ENCOUNTER — Other Ambulatory Visit: Payer: Self-pay

## 2024-03-07 DIAGNOSIS — Z79899 Other long term (current) drug therapy: Secondary | ICD-10-CM | POA: Insufficient documentation

## 2024-03-07 DIAGNOSIS — I10 Essential (primary) hypertension: Secondary | ICD-10-CM | POA: Insufficient documentation

## 2024-03-07 DIAGNOSIS — R42 Dizziness and giddiness: Secondary | ICD-10-CM | POA: Insufficient documentation

## 2024-03-07 LAB — CBC WITH DIFFERENTIAL/PLATELET
Abs Immature Granulocytes: 0.01 K/uL (ref 0.00–0.07)
Basophils Absolute: 0 K/uL (ref 0.0–0.1)
Basophils Relative: 1 %
Eosinophils Absolute: 0.2 K/uL (ref 0.0–0.5)
Eosinophils Relative: 3 %
HCT: 39.7 % (ref 36.0–46.0)
Hemoglobin: 13.2 g/dL (ref 12.0–15.0)
Immature Granulocytes: 0 %
Lymphocytes Relative: 46 %
Lymphs Abs: 3.6 K/uL (ref 0.7–4.0)
MCH: 30.6 pg (ref 26.0–34.0)
MCHC: 33.2 g/dL (ref 30.0–36.0)
MCV: 91.9 fL (ref 80.0–100.0)
Monocytes Absolute: 0.5 K/uL (ref 0.1–1.0)
Monocytes Relative: 7 %
Neutro Abs: 3.3 K/uL (ref 1.7–7.7)
Neutrophils Relative %: 43 %
Platelets: 195 K/uL (ref 150–400)
RBC: 4.32 MIL/uL (ref 3.87–5.11)
RDW: 12.4 % (ref 11.5–15.5)
WBC: 7.7 K/uL (ref 4.0–10.5)
nRBC: 0 % (ref 0.0–0.2)

## 2024-03-07 LAB — URINALYSIS, W/ REFLEX TO CULTURE (INFECTION SUSPECTED)
Bacteria, UA: NONE SEEN
Bilirubin Urine: NEGATIVE
Glucose, UA: NEGATIVE mg/dL
Hgb urine dipstick: NEGATIVE
Ketones, ur: NEGATIVE mg/dL
Leukocytes,Ua: NEGATIVE
Nitrite: NEGATIVE
Protein, ur: NEGATIVE mg/dL
Specific Gravity, Urine: 1.002 — ABNORMAL LOW (ref 1.005–1.030)
pH: 6 (ref 5.0–8.0)

## 2024-03-07 LAB — COMPREHENSIVE METABOLIC PANEL WITH GFR
ALT: 8 U/L (ref 0–44)
AST: 22 U/L (ref 15–41)
Albumin: 4.6 g/dL (ref 3.5–5.0)
Alkaline Phosphatase: 81 U/L (ref 38–126)
Anion gap: 16 — ABNORMAL HIGH (ref 5–15)
BUN: 14 mg/dL (ref 8–23)
CO2: 22 mmol/L (ref 22–32)
Calcium: 9.3 mg/dL (ref 8.9–10.3)
Chloride: 100 mmol/L (ref 98–111)
Creatinine, Ser: 0.7 mg/dL (ref 0.44–1.00)
GFR, Estimated: >60 mL/min
Glucose, Bld: 89 mg/dL (ref 70–99)
Potassium: 4.3 mmol/L (ref 3.5–5.1)
Sodium: 137 mmol/L (ref 135–145)
Total Bilirubin: 0.3 mg/dL (ref 0.0–1.2)
Total Protein: 7.4 g/dL (ref 6.5–8.1)

## 2024-03-07 LAB — CBG MONITORING, ED: Glucose-Capillary: 94 mg/dL (ref 70–99)

## 2024-03-07 MED ORDER — SODIUM CHLORIDE 0.9 % IV BOLUS
1000.0000 mL | Freq: Once | INTRAVENOUS | Status: AC
  Administered 2024-03-07: 1000 mL via INTRAVENOUS

## 2024-03-07 NOTE — ED Triage Notes (Signed)
 Patient arrives ambulatory by POV c/o intermittent dizziness episodes ongoing for a while. States she has an appt with PCP later this month. Felt like she was going to pass out today.

## 2024-03-07 NOTE — Discharge Instructions (Signed)
Plenty of fluids and follow up with your family doctor next week for recheck

## 2024-03-07 NOTE — ED Notes (Signed)
 Patient transported to CT

## 2024-03-07 NOTE — ED Provider Notes (Signed)
 "  EMERGENCY DEPARTMENT AT [facility] Provider Note   CSN: [id number] Arrival date & time: 03/07/24  1713     Patient presents with: Dizziness   Emily Cooke is a 74 y.o. female.  {Add pertinent medical, surgical, social history, OB history to YEP:67052} Patient states she had some dizziness today but did not pass out.  She has a history of hypertension   Dizziness      Prior to Admission medications  Medication Sig Start Date End Date Taking? Authorizing Provider  acetaminophen  (TYLENOL ) 500 MG tablet Take 1 tablet (500 mg total) by mouth every 6 (six) hours as needed for moderate pain. 06/20/19   Landy Honora CROME, PA-C  amLODipine (NORVASC) 2.5 MG tablet  12/26/19   [provider]  azithromycin  (ZITHROMAX ) 250 MG tablet Take 1 tablet (250 mg total) by mouth daily. Take first 2 tablets together, then 1 every day until finished. 01/21/24   Yolande Lamar BROCKS, MD  clindamycin  (CLEOCIN ) 300 MG capsule Take 1 capsule (300 mg total) by mouth 4 (four) times daily. X 7 days 07/15/23   Geroldine Berg, MD  cyclobenzaprine  (FLEXERIL ) 10 MG tablet Take 1 tablet (10 mg total) by mouth 2 (two) times daily as needed for muscle spasms. 03/25/18   Midge Golas, MD  DEXILANT 60 MG capsule Take 60 mg by mouth daily.  05/31/16   [provider]  dicyclomine  (BENTYL ) 20 MG tablet Take 1 tablet (20 mg total) by mouth 3 (three) times daily as needed (abdominal cramping). 02/15/18   Mesner, Selinda, MD  HYDROcodone -acetaminophen  (NORCO/VICODIN) 5-325 MG tablet Take 1-2 tablets by mouth every 6 (six) hours as needed. 07/15/23   Geroldine Berg, MD  hydrocortisone  2.5 % cream Apply topically 2 (two) times daily as needed (Rash). 01/28/20   Jackquline Sawyer, MD  hydrOXYzine  (ATARAX /VISTARIL ) 25 MG tablet Take 1 tablet by mouth three times daily as needed anxiety. 01/12/15   [provider]  lisinopril  (PRINIVIL ,ZESTRIL ) 5 MG tablet Take 0.5 tablets (2.5 mg total) by  mouth daily. 04/09/14   Midge Golas, MD  meclizine  (ANTIVERT ) 25 MG tablet TK 1 T PO TID PRN 05/01/18   [provider]  meloxicam (MOBIC) 15 MG tablet  04/17/18   [provider]  pantoprazole (PROTONIX) 40 MG tablet TK 1 T PO QD 05/21/18   [provider]  polyethylene glycol (MIRALAX ) 17 g packet Take 17 g by mouth in the morning and at bedtime. 02/18/23   Kommor, Madison, MD  propranolol (INDERAL) 10 MG tablet Take 10 mg by mouth at bedtime.      [provider]  triamcinolone  cream (KENALOG ) 0.1 % Apply 1 Application topically 2 (two) times daily as needed (Rash). Avoid applying to face, groin, and axilla. Use as directed. Long-term use can cause thinning of the skin. 09/14/22   Jackquline Sawyer, MD  VENTOLIN HFA 108 (90 Base) MCG/ACT inhaler  07/23/18   [provider]    Allergies: Cephalexin, Ibuprofen , Amoxicillin, Bactrim [sulfamethoxazole-trimethoprim], Ciprofloxacin, Imitrex [sumatriptan succinate], Morphine and codeine , Tetracyclines & related, and Zoloft [sertraline hcl]    Review of Systems  Neurological:  Positive for dizziness.    Updated Vital Signs BP (!) 160/95   Pulse 82   Temp 98 F (36.7 C) (Oral)   Resp 20   Ht 5' 8 (1.727 m)   Wt 101.6 kg   SpO2 97%   BMI 34.06 kg/m   Physical Exam  (all labs ordered are listed, but only  abnormal results are displayed) Labs Reviewed  COMPREHENSIVE METABOLIC PANEL WITH GFR - Abnormal; Notable for the following components:      Result Value   Anion gap 16 (*)    All other components within normal limits  URINALYSIS, W/ REFLEX TO CULTURE (INFECTION SUSPECTED) - Abnormal; Notable for the following components:   Color, Urine COLORLESS (*)    Specific Gravity, Urine 1.002 (*)    All other components within normal limits  CBC WITH DIFFERENTIAL/PLATELET  CBG MONITORING, ED    EKG: EKG Interpretation Date/Time:  Thursday March 07 2024 17:37:30 EST Ventricular Rate:  82 PR  Interval:  170 QRS Duration:  95 QT Interval:  381 QTC Calculation: 445 R Axis:   4  Text Interpretation: Sinus rhythm Confirmed by Suzette Pac [phone]) on 03/07/2024 6:43:05 PM  Radiology: CT Head Wo Contrast Result Date: 03/07/2024 EXAM: CT HEAD WITHOUT CONTRAST 03/07/2024 06:37:44 PM TECHNIQUE: CT of the head was performed without the administration of intravenous contrast. Automated exposure control, iterative reconstruction, and/or weight based adjustment of the mA/kV was utilized to reduce the radiation dose to as low as reasonably achievable. COMPARISON: CT angiography head at 11:25:20 CLINICAL HISTORY: Syncope/presyncope, cerebrovascular cause suspected. FINDINGS: BRAIN AND VENTRICLES: No acute hemorrhage. No evidence of acute infarct. No hydrocephalus. No extra-axial collection. No mass effect or midline shift. ORBITS: No acute abnormality. SINUSES: Chronic left sphenoid sinusitis. Polypoid mucosal thickening in posterior left ethmoid air cells. SOFT TISSUES AND SKULL: No acute soft tissue abnormality. No skull fracture. IMPRESSION: 1. No acute intracranial abnormality. Electronically signed by: Morgane Naveau MD 03/07/2024 06:54 PM EST RP Workstation: [id number]    {Document cardiac monitor, telemetry assessment procedure when appropriate:32947} Procedures   Medications Ordered in the ED  sodium chloride  0.9 % bolus 1,000 mL (1,000 mLs Intravenous Bolus 03/07/24 1829)      {Click here for ABCD2, HEART and other calculators REFRESH Note before signing:1}                              Medical Decision Making Amount and/or Complexity of Data Reviewed Radiology: ordered.   Dizziness that has resolved. {Document critical care time when appropriate  Document review of labs and clinical decision tools ie [id number], etc  Document your independent review of radiology images and any outside records  Document your discussion with family members, caretakers and with consultants  Document  social determinants of health affecting pt's care  Document your decision making why or why not admission, treatments were needed:32947:::1}   Final diagnoses:  None    ED Discharge Orders     None        "
